# Patient Record
Sex: Male | Born: 1980 | Race: White | Hispanic: No | Marital: Single | Smoking: Current every day smoker
Health system: Southern US, Community
[De-identification: ages and names within clinical notes are randomized; demographics above are authoritative.]

## PROBLEM LIST (undated history)

## (undated) ENCOUNTER — Ambulatory Visit (HOSPITAL_COMMUNITY): Admission: EM | Payer: Self-pay

## (undated) DIAGNOSIS — K219 Gastro-esophageal reflux disease without esophagitis: Secondary | ICD-10-CM

## (undated) DIAGNOSIS — T7840XA Allergy, unspecified, initial encounter: Secondary | ICD-10-CM

## (undated) DIAGNOSIS — E119 Type 2 diabetes mellitus without complications: Secondary | ICD-10-CM

## (undated) DIAGNOSIS — I10 Essential (primary) hypertension: Secondary | ICD-10-CM

## (undated) DIAGNOSIS — F419 Anxiety disorder, unspecified: Secondary | ICD-10-CM

## (undated) DIAGNOSIS — F32A Depression, unspecified: Secondary | ICD-10-CM

## (undated) DIAGNOSIS — E785 Hyperlipidemia, unspecified: Secondary | ICD-10-CM

## (undated) DIAGNOSIS — J45909 Unspecified asthma, uncomplicated: Secondary | ICD-10-CM

## (undated) HISTORY — DX: Anxiety disorder, unspecified: F41.9

## (undated) HISTORY — DX: Essential (primary) hypertension: I10

## (undated) HISTORY — DX: Hyperlipidemia, unspecified: E78.5

## (undated) HISTORY — DX: Type 2 diabetes mellitus without complications: E11.9

## (undated) HISTORY — PX: TOOTH EXTRACTION: SUR596

## (undated) HISTORY — DX: Depression, unspecified: F32.A

## (undated) HISTORY — DX: Gastro-esophageal reflux disease without esophagitis: K21.9

## (undated) HISTORY — DX: Allergy, unspecified, initial encounter: T78.40XA

## (undated) HISTORY — DX: Unspecified asthma, uncomplicated: J45.909

---

## 2005-09-22 ENCOUNTER — Ambulatory Visit: Payer: Self-pay | Admitting: Internal Medicine

## 2005-10-06 ENCOUNTER — Ambulatory Visit: Payer: Self-pay | Admitting: Internal Medicine

## 2005-10-08 ENCOUNTER — Ambulatory Visit: Payer: Self-pay | Admitting: *Deleted

## 2005-10-20 ENCOUNTER — Ambulatory Visit: Payer: Self-pay | Admitting: Internal Medicine

## 2005-12-25 ENCOUNTER — Ambulatory Visit: Payer: Self-pay | Admitting: Internal Medicine

## 2005-12-26 ENCOUNTER — Ambulatory Visit (HOSPITAL_COMMUNITY): Admission: RE | Admit: 2005-12-26 | Discharge: 2005-12-26 | Payer: Self-pay | Admitting: Internal Medicine

## 2005-12-28 ENCOUNTER — Emergency Department (HOSPITAL_COMMUNITY): Admission: EM | Admit: 2005-12-28 | Discharge: 2005-12-28 | Payer: Self-pay | Admitting: Emergency Medicine

## 2005-12-29 ENCOUNTER — Ambulatory Visit: Payer: Self-pay | Admitting: Internal Medicine

## 2006-01-16 ENCOUNTER — Emergency Department (HOSPITAL_COMMUNITY): Admission: EM | Admit: 2006-01-16 | Discharge: 2006-01-16 | Payer: Self-pay | Admitting: Family Medicine

## 2006-01-19 ENCOUNTER — Ambulatory Visit: Payer: Self-pay | Admitting: Gastroenterology

## 2006-03-02 ENCOUNTER — Emergency Department (HOSPITAL_COMMUNITY): Admission: EM | Admit: 2006-03-02 | Discharge: 2006-03-02 | Payer: Self-pay | Admitting: Emergency Medicine

## 2006-08-28 ENCOUNTER — Ambulatory Visit: Payer: Self-pay | Admitting: Internal Medicine

## 2006-10-06 ENCOUNTER — Ambulatory Visit: Payer: Self-pay | Admitting: Internal Medicine

## 2006-11-20 ENCOUNTER — Ambulatory Visit: Payer: Self-pay | Admitting: Internal Medicine

## 2006-11-27 ENCOUNTER — Ambulatory Visit: Payer: Self-pay | Admitting: Internal Medicine

## 2006-11-28 ENCOUNTER — Inpatient Hospital Stay (HOSPITAL_COMMUNITY): Admission: EM | Admit: 2006-11-28 | Discharge: 2006-12-02 | Payer: Self-pay | Admitting: Emergency Medicine

## 2006-12-11 ENCOUNTER — Ambulatory Visit: Payer: Self-pay | Admitting: Internal Medicine

## 2007-01-22 ENCOUNTER — Emergency Department (HOSPITAL_COMMUNITY): Admission: EM | Admit: 2007-01-22 | Discharge: 2007-01-22 | Payer: Self-pay | Admitting: Family Medicine

## 2007-01-29 ENCOUNTER — Ambulatory Visit: Payer: Self-pay | Admitting: Internal Medicine

## 2007-02-04 ENCOUNTER — Ambulatory Visit: Payer: Self-pay | Admitting: Internal Medicine

## 2007-03-04 ENCOUNTER — Emergency Department (HOSPITAL_COMMUNITY): Admission: EM | Admit: 2007-03-04 | Discharge: 2007-03-04 | Payer: Self-pay | Admitting: Family Medicine

## 2007-03-18 ENCOUNTER — Ambulatory Visit: Payer: Self-pay | Admitting: Internal Medicine

## 2007-04-14 ENCOUNTER — Ambulatory Visit (HOSPITAL_BASED_OUTPATIENT_CLINIC_OR_DEPARTMENT_OTHER): Admission: RE | Admit: 2007-04-14 | Discharge: 2007-04-14 | Payer: Self-pay | Admitting: Internal Medicine

## 2007-04-25 ENCOUNTER — Ambulatory Visit: Payer: Self-pay | Admitting: Internal Medicine

## 2007-07-21 ENCOUNTER — Ambulatory Visit: Payer: Self-pay | Admitting: Internal Medicine

## 2007-08-04 ENCOUNTER — Encounter (INDEPENDENT_AMBULATORY_CARE_PROVIDER_SITE_OTHER): Payer: Self-pay | Admitting: *Deleted

## 2007-10-21 ENCOUNTER — Emergency Department (HOSPITAL_COMMUNITY): Admission: EM | Admit: 2007-10-21 | Discharge: 2007-10-21 | Payer: Self-pay | Admitting: Emergency Medicine

## 2007-11-03 ENCOUNTER — Ambulatory Visit: Payer: Self-pay | Admitting: Internal Medicine

## 2007-11-03 LAB — CONVERTED CEMR LAB
ALT: 45 units/L (ref 0–53)
AST: 18 units/L (ref 0–37)
Albumin: 4.6 g/dL (ref 3.5–5.2)
Alkaline Phosphatase: 55 units/L (ref 39–117)
BUN: 15 mg/dL (ref 6–23)
Bilirubin, Direct: 0.1 mg/dL (ref 0.0–0.3)
CO2: 23 meq/L (ref 19–32)
Calcium: 9.6 mg/dL (ref 8.4–10.5)
Chloride: 103 meq/L (ref 96–112)
Cholesterol: 238 mg/dL — ABNORMAL HIGH (ref 0–200)
Creatinine, Ser: 1.02 mg/dL (ref 0.40–1.50)
Glucose, Bld: 65 mg/dL — ABNORMAL LOW (ref 70–99)
HDL: 37 mg/dL — ABNORMAL LOW (ref >39)
Indirect Bilirubin: 0.4 mg/dL (ref 0.0–0.9)
LDL Cholesterol: 131 mg/dL — ABNORMAL HIGH (ref 0–99)
Potassium: 4.8 meq/L (ref 3.5–5.3)
Sodium: 138 meq/L (ref 135–145)
Total Bilirubin: 0.5 mg/dL (ref 0.3–1.2)
Total CHOL/HDL Ratio: 6.4
Total Protein: 7.6 g/dL (ref 6.0–8.3)
Triglycerides: 348 mg/dL — ABNORMAL HIGH (ref <150)
VLDL: 70 mg/dL — ABNORMAL HIGH (ref 0–40)
Valproic Acid Lvl: 71 ug/mL (ref 50.0–100.0)

## 2007-12-17 ENCOUNTER — Ambulatory Visit: Payer: Self-pay | Admitting: Internal Medicine

## 2008-04-25 ENCOUNTER — Ambulatory Visit: Payer: Self-pay | Admitting: Internal Medicine

## 2009-04-05 ENCOUNTER — Encounter: Admission: RE | Admit: 2009-04-05 | Discharge: 2009-05-09 | Payer: Self-pay | Admitting: Family Medicine

## 2009-11-25 ENCOUNTER — Ambulatory Visit: Payer: Self-pay | Admitting: Diagnostic Radiology

## 2009-11-25 ENCOUNTER — Emergency Department (HOSPITAL_BASED_OUTPATIENT_CLINIC_OR_DEPARTMENT_OTHER): Admission: EM | Admit: 2009-11-25 | Discharge: 2009-11-25 | Payer: Self-pay | Admitting: Emergency Medicine

## 2009-12-31 ENCOUNTER — Encounter: Admission: RE | Admit: 2009-12-31 | Discharge: 2010-01-31 | Payer: Self-pay | Admitting: Family Medicine

## 2010-04-09 ENCOUNTER — Emergency Department (HOSPITAL_BASED_OUTPATIENT_CLINIC_OR_DEPARTMENT_OTHER): Admission: EM | Admit: 2010-04-09 | Discharge: 2010-04-09 | Payer: Self-pay | Admitting: Emergency Medicine

## 2010-09-30 ENCOUNTER — Emergency Department (HOSPITAL_BASED_OUTPATIENT_CLINIC_OR_DEPARTMENT_OTHER): Admission: EM | Admit: 2010-09-30 | Discharge: 2010-09-30 | Payer: Self-pay | Admitting: Emergency Medicine

## 2010-11-20 ENCOUNTER — Emergency Department (HOSPITAL_BASED_OUTPATIENT_CLINIC_OR_DEPARTMENT_OTHER)
Admission: EM | Admit: 2010-11-20 | Discharge: 2010-11-20 | Payer: Self-pay | Source: Home / Self Care | Admitting: Emergency Medicine

## 2011-04-01 NOTE — Procedures (Signed)
Edwin, Ford NO.:  [id number]   MEDICAL RECORD NO.:  [id number]          PATIENT TYPE:  OUT   LOCATION:  SLEEP CENTER                 FACILITY:  [facility]   PHYSICIAN:  Clinton D. Maple Hudson, MD, FCCP, FACPDATE OF BIRTH:  01/07/1979   DATE OF STUDY:  04/14/2007                            NOCTURNAL POLYSOMNOGRAM   REFERRING PHYSICIAN:   INDICATIONS FOR PROCEDURE:  Hypersomnia with sleep apnea.   RESULTS:  [name] sleepiness score 8/24, BMI 42.3, weight 313 pounds.   MEDICATIONS:  Home medication is listed and reviewed.   SLEEP ARCHITECTURE:  Total sleep time 323 minutes with sleep efficiency  80%. Stage 1 was 11%; Stage 2, 79%; Stages 3 and 4, absent. REM 10% of  total sleep time. Sleep latency 11 minutes. REM latency 177 minutes.  Awake after sleep onset 72 minutes. Arousal index 16.5. No bedtime  medication was taken.   RESPIRATORY DATA:  Split study protocol. Apnea hypopnea index (AHI RDI)  13.2 obstructive events per hour, indicating mild obstructive sleep  apnea/hypopnea syndrome before CPAP. There were 4 obstructive apnea's  and 35 hypopnea's before CPAP. Events were positional, mostly associated  with supine sleep position, which was the dominant sleep position in the  first half of the night. REM AHI 50.6. CPAP was titrated to 14 CWP, AHI  zero per hour. A medium ResMed Quattro full face mask was used with a  heated humidifier.   OXYGEN DATA:  Moderate to loud snoring with oxygen desaturation to a  nadir of 85%. After CPAP control, saturation held 93% to 95% on room  air.   CARDIAC DATA:  Normal sinus rhythm.   MOVEMENT/PARASOMNIA:  Occasional limb jerk with arousal, insignificant.   IMPRESSION/RECOMMENDATIONS:  1. Mild obstructive sleep apnea/hypopnea syndrome, AHI 13.2 per hour      with most events and sleep in the first part of the night      associated with supine sleep position. Moderate to loud snoring      with oxygen desaturation to a  nadir of 85%.  2. Successful CPAP titration to 14 CWP, AHI zero per hour. A medium      ResMed Quattro full face mask was used with a heated humidifier.      Clinton D. Maple Hudson, MD, [facility], FACP  Diplomate, Biomedical engineer of Sleep Medicine  Electronically Signed     CDY/MEDQ  D:  04/24/2007 12:01:32  T:  04/24/2007 20:07:52  Job:  [id number]

## 2011-04-04 NOTE — H&P (Signed)
Edwin Ford, Edwin Ford NO.:  [id number]   MEDICAL RECORD NO.:  [id number]          PATIENT TYPE:  EMS   LOCATION:  ED                           FACILITY:  [facility]   PHYSICIAN:  Marcellus Scott, MD     DATE OF BIRTH:  [birth year]   DATE OF ADMISSION:  11/28/2006  DATE OF DISCHARGE:                              HISTORY & PHYSICAL   PRIMARY CARE PHYSICIAN:  Dineen Kid. Reche Dixon, M.D. of [facility]; hence,  patient unassigned to the [facility].   HEMATOLOGIST:  Lajuana Matte, MD   PSYCHIATRIST:  [facility].   CHIEF COMPLAINT:  Generalized weakness, polyuria, polydipsia.   HISTORY OF PRESENT ILLNESS:  Edwin Ford is a pleasant 30 year old  Caucasian male patient with a past medical history of type 2 diabetes,  hypertension, asthma, and bipolar disorder.  He was first diagnosed to  have diabetes approximately a year ago when he was initially started on  Glucotrol; however, when he presented in February, 2007 for an episode  of chest pain, the Glucotrol was discontinued for some unclear reasons.  Since then, the patient has not been on any medications for diabetes.  Patient looks like also did not follow up with a primary medical doctor  until approximately two weeks ago.  Since the beginning of this year,  patient has noted generalized weakness, polyuria, polydipsia, decreased  appetite, nausea, blurred vision, following which he sought attention  with Dr. Reche Dixon when he was started on Lantus 10 units subcutaneously  nightly and was advised to titrate the dose upwards.  He was also  started on Metformin.  The patient says that his home blood sugar checks  have persistently shown, high readings following which he presented  back to his physician on Friday when blood works were done, and his  Metformin was stopped, and Glucotrol again started.  Today, he received  a call from his nurse at the [facility] health center, with abnormal  lab indices.  Patient continued to remain sick and was advised to come  to the emergency room for evaluation and management.   PAST MEDICAL HISTORY:  1. Diabetes type 2 of one year duration.  2. Hypertension of three year's duration.  3. Asthma since childhood with infrequent exacerbations.  Uses his      inhaler twice a week p.r.n.  4. Bipolar disorder.   DRUG ALLERGIES:  ALEVE.  Says a questionable history of  thrombocytopenia.   MEDICATIONS:  1. Metoprolol 25 mg p.o. b.i.d.  2. Depakote 500 mg tablet 3 tablets at bedtime.  3. Fish oil 1000 mg 2 tablets a day.  4. Clonazepam 1 mg 3 times a day.  5. Lantus insulin 10 units at bedtime, which has been titrated up to      25 units at bedtime.  6. Lisinopril 20 mg p.o. daily.  7. Cozaar 50 mg p.o. daily.  8. Hydrochlorothiazide 25 mg p.o. daily.  9. Lamictal 100 mg p.o. at bedtime.  10.Protonix 40 mg p.o. daily.  11.Glucotrol 10 mg p.o. daily.  12.Clotrimazole/betamethasone diproprionate cream, use  2 times daily      to the rash.   FAMILY HISTORY:  Mother with history of hypertension, diabetes,  dyslipidemia, and coronary artery bypass graft.  Father with history of  nephrectomy for renal cell CA, hypertension, dyslipidemia, diabetes,  coronary stents.   SOCIAL HISTORY:  Patient is unemployed.  Smokes one pack of cigarettes  per day since the age of 14 years.  Quit social alcohol intake 10 years  ago.  History of marijuana use currently.   REVIEW OF SYSTEMS:  HEENT:  Patient denies any headache, earache, sore  throat, difficulty swallowing.  Has blurred vision.  GENERAL:  Patient  with history of chills but no fevers or rigors.  Generalized weakness.  RESPIRATORY:  Occasional cough with white sputum.  Dyspnea on exertion.  There is no wheezing, tightness of the chest.  CARDIOVASCULAR:  No chest  pain, palpitations, orthopnea, PND, leg swelling.  ABDOMEN:  History of  intermittent abdominal pain over the last two weeks.   Nausea with no  vomiting.  Decreased appetite.  History of diarrhea maybe once or twice  a day.  No constipation.  GENITOURINARY:  History of polyuria.  No  dysuria, urgency.  MUSCULOSKELETAL:  No pain.  EXTREMITIES:  Unremarkable.  PSYCHIATRIC:  No delusions, hallucinations, suicidal  ideations or homicidal ideation.  CNS:  No asymmetrical limb weakness,  tingling, numbness, mouth twisting, slurred speech, or loss of  consciousness.   PHYSICAL EXAMINATION:  GENERAL:  Edwin Ford is a pleasant young male,  obese, in no cardiopulmonary or painful distress.  Quite comfortable.  VITAL SIGNS:  Temperature 98.1 degrees Fahrenheit, pulse 97 per minute,  regular, respirations 16 per minute, blood pressure 133/61.  HEENT:  Head is normocephalic and atraumatic.  Pupils are round and  symmetrical, equally reactive to light and accommodation.  Extraocular  muscle movements are intact.  Mucosa are dry and anicteric.  Oral cavity  has multiple teeth fillings.  No pharyngeal erythema.  NECK:  His neck is thick.  There is no JVD, carotid bruit,  lymphadenopathy, or goiter.  RESPIRATORY:  Clear to auscultation bilaterally.  CARDIOVASCULAR:  First and second heart sounds heard.  No third or  fourth heart sounds.  No murmurs, rubs, gallops, or clicks.  ABDOMEN:  Obese.  Nontender.  No organomegaly or mass.  Bowel sounds are  heard normally.  GENITOURINARY:  no rash  EXTREMITIES:  Peripheral pulses symmetrically felt.  No clubbing,  cyanosis or edema.  CENTRAL NERVOUS SYSTEM:  Patient is awake, alert and oriented x3.  No  cranial nerve deficits.  SKIN:  Without any rashes.   LAB DATA:  CBC:  Hemoglobin 14.6, hematocrit 40.8, MCV 89, white blood  cells 3.4, platelet count 173.  Complete metabolic panel with sodium of  121.  Corrected sodium is 127.  Potassium is 4.1, chloride 84, bicarb  25, glucose 510, BUN 22, creatinine 0.93, total bilirubin 1.4, alk phos 77, AST 38, ALT 55, total protein 5.3,  albumin 3.6, calcium 9.  The most  recent CBGs after Glucomander is 329.   ASSESSMENT/PLAN:  Edwin Ford is a 30 year old male patient with a  history of type 2 diabetes, hypertension, asthma, and bipolar disorder  now with:  1. Uncontrolled diabetes:  Will admit the patient to the      medical/surgical floor.  Will place the patient n.p.o.  Will      aggressively hydrate the patient with normal saline with potassium      supplements.  Will  place the patient on Glucomander insulin and      then subsequently on Lantus insulin.  I will monitor the patient's      basic metabolic panel frequently.  Will obtain diabetes      instructions and diet counseling for the patient.  Will check the      patient's hemoglobin A1C, urinalysis, and fasting lipids.  Will      hold the patient's hydrochlorothiazide at this time secondary to      his dehydration.  2. Dehydration secondary to uncontrolled diabetes:  Management per #1.  3. Hypertension:  Controlled at this time.  Will continue the      patient's home medications of metoprolol, lisinopril, and Cozaar.  4. History of asthma:  Stable at this time.  Will place the patient on      p.r.n. nebulization bronchodilators.  5. Bipolar disorder, stable at this time:  Will continue the patient's      home medications of Depakote, Clonazepam, and Lamictal.  6. Will place the patient on deep venous thrombosis and      gastrointestinal prophylaxis.  7. Abnormal hepatic panel, questionable secondary to fatty liver.      Will follow up hepatic panel and further workup if no improvement.  8. Obesity:  For dietary counseling and weight loss.  9. Rule out obstructive sleep apnea as an outpatient.      Marcellus Scott, MD  Electronically Signed     AH/MEDQ  D:  11/28/2006  T:  11/28/2006  Job:  [id number]   cc:   Dineen Kid. Reche Dixon, M.D.  Fax: [phone]

## 2011-04-04 NOTE — Discharge Summary (Signed)
Edwin Ford, Edwin Ford NO.:  [id number]   MEDICAL RECORD NO.:  [id number]          PATIENT TYPE:  INP   LOCATION:  1607                         FACILITY:  [facility]   PHYSICIAN:  Michaelyn Barter, M.D. DATE OF BIRTH:  1981/09/21   DATE OF ADMISSION:  11/28/2006  DATE OF DISCHARGE:  12/02/2006                               DISCHARGE SUMMARY   PRIMARY CARE PHYSICIAN:  Dr. Reche Dixon of [facility].   FINAL DIAGNOSES:  1. Uncontrolled diabetes mellitus.  2. Renal insufficiency.  3. Elevated liver enzymes.  4. Hyperlipidemia.  5. Fatty liver by ultrasound examination.  6. Dehydration.   PROCEDURES:  Ultrasound of the abdomen completed on  January13,2008.   HISTORY OF PRESENT ILLNESS:  Mr. Edwin Ford is a 30 year old gentleman with  a past medical history of type 2 diabetes mellitus, hypertension,  asthma, and bipolar disorder.  The patient had not been taking any  medications for his diabetes prior to this admission.  He complained of  generalized weakness, polyuria, polydipsia, decreased appetite, nausea,  blurred vision, and was recently started on Lantus insulin by his  primary care doctor as well as metformin.  He indicated that his sugars  at home had been elevated.  He went to see his PCP Friday prior to this  admission and was subsequently called by one of the health care  professionals of the facility and told to come to the hospital for  further evaluation.   PAST MEDICAL HISTORY:   HOSPITAL COURSE.:  1. Uncontrolled diabetes mellitus.  The patient arrived to the ER and      was found to have elevated glucose's. He was started on the      Glucommander insulin treatment.  During the course of his      hospitalization, his sugars became better controlled and the      Glucommander was discontinued.  The patient began to receive      subcutaneous Lantus insulin as well as glipizide.  His polyuria,      polydipsia and other constitutional symptoms have  improved.  2. Dehydration.  This was secondary to the polyuria that occurred as a      result of the patient's uncontrolled diabetes mellitus.  This has      improved with aggressive IV fluid hydration.  3. Hyponatremia. At the time of his admission, the patient's sodium      level was noted to be low.  This may have been associated with the      hyperosmolar hyperglycemic state.  As the patient's sugars have      become better controlled, his sodium level has corrected.  4. Elevated liver enzymes.  The patient's liver enzymes have been      noted to be slightly elevated.  An ultrasound of the patient's      abdomen was completed on January 13.  It revealed diffuse, fatty      infiltration of the liver.  Small gallbladder noted.  No evidence      of gallstones or biliary dilatation.  The patient has not  complained of any abdominal related symptoms.  The etiology of the      patient's diffusely infiltrative fatty liver may need to be worked      up further once the patient is discharged.  5. Hyperlipidemia.  The patient did have a fasting lipid profile      completed during the course of this hospitalization. His      cholesterol level was noted to have been 543.  His triglycerides      are noted to be 3307.  The patient definitely needs to be started      on a lipid-lowering agent given his age and his multiple risk      factors. However, because the patient's liver enzymes are currently      still slightly elevated, the patient has not been started on a      statin or Tricor agent at this time.  I have told the patient that      it is essential that he follow up with his primary care physician      to be reevaluated regarding the need for a lipid-lowering agent.      The patient is at high risk for a MI and/or stroke given his      hyperlipidemia and his numerous other comorbidities.  6. Renal insufficiency.  On January 13, the patient's BUN was noted to      be 20.  His creatinine  was 1.85.  These numbers were elevated, most      likely secondary to dehydration. With IV fluid hydration, the      patient's BUN and creatinine have improved.  On December 01, 2006,      the patient's BUN was 11.  His creatinine 0.69.  Currently, the      patient indicates he feels better.  He denies nausea, vomiting, no      polyuria, no polydipsia.  His vitals today:  His temperature is      97.8, heart rate 67, respirations 20, blood pressure 133/85. O2 sat      95% on room air.  CBG 175 and 168.  The patient will be discharged      from the hospital.  His medications at the time of discharge will      consist of:  7. Klonopin 1 mg p.o. t.i.d.  8. Depakote 1500 mg p.o. nightly.  9. Glipizide 10 mg p.o. daily.  10.Lantus insulin 30 units subcutaneous nightly.  11.Lamictal 100 mg p.o. daily.  12.Lisinopril 20 mg p.o. daily.  13.Cozaar 50 mg p.o. daily.  14.Metoprolol 25 mg p.o. b.i.d.  15.Protonix 40 mg p.o. daily.   The patient will be instructed to follow up with [facility] within the  next 1 week; and, again, the patient will be instructed to have his  liver enzymes rechecked and to discuss whether or not it is okay to  start a lipid-lowering agent with his primary care doctor.      Michaelyn Barter, M.D.  Electronically Signed     OR/MEDQ  D:  12/02/2006  T:  12/02/2006  Job:  [id number]   cc:   Dineen Kid. Reche Dixon, M.D.  Fax: [phone]

## 2011-07-17 ENCOUNTER — Emergency Department (HOSPITAL_BASED_OUTPATIENT_CLINIC_OR_DEPARTMENT_OTHER)
Admission: EM | Admit: 2011-07-17 | Discharge: 2011-07-18 | Disposition: A | Discharge status: Home / Self Care | Payer: Worker's Compensation | Attending: Emergency Medicine | Admitting: Emergency Medicine

## 2011-07-17 ENCOUNTER — Encounter: Payer: Self-pay | Admitting: *Deleted

## 2011-07-17 DIAGNOSIS — E7800 Pure hypercholesterolemia, unspecified: Secondary | ICD-10-CM | POA: Insufficient documentation

## 2011-07-17 DIAGNOSIS — E119 Type 2 diabetes mellitus without complications: Secondary | ICD-10-CM | POA: Insufficient documentation

## 2011-07-17 DIAGNOSIS — M62838 Other muscle spasm: Secondary | ICD-10-CM

## 2011-07-17 DIAGNOSIS — I10 Essential (primary) hypertension: Secondary | ICD-10-CM | POA: Insufficient documentation

## 2011-07-17 DIAGNOSIS — M5380 Other specified dorsopathies, site unspecified: Secondary | ICD-10-CM | POA: Insufficient documentation

## 2011-07-17 DIAGNOSIS — M549 Dorsalgia, unspecified: Secondary | ICD-10-CM | POA: Insufficient documentation

## 2011-07-17 NOTE — ED Notes (Signed)
Pt was riding a [employer], mining at work and hit a hole states that he jarred his back pt with mid right side back pain

## 2011-07-18 MED ORDER — DIAZEPAM 5 MG PO TABS
5.0000 mg | ORAL_TABLET | Freq: Once | ORAL | Status: AC
  Administered 2011-07-18: 5 mg via ORAL
  Filled 2011-07-18: qty 1

## 2011-07-18 MED ORDER — OXYCODONE-ACETAMINOPHEN 5-325 MG PO TABS
2.0000 | ORAL_TABLET | Freq: Once | ORAL | Status: AC
  Administered 2011-07-18: 2 via ORAL
  Filled 2011-07-18 (×3): qty 1

## 2011-07-18 MED ORDER — KETOROLAC TROMETHAMINE 30 MG/ML IJ SOLN
60.0000 mg | Freq: Once | INTRAMUSCULAR | Status: AC
  Administered 2011-07-18: 60 mg via INTRAVENOUS
  Filled 2011-07-18 (×2): qty 1

## 2011-07-18 MED ORDER — OXYCODONE-ACETAMINOPHEN 5-325 MG PO TABS: 2.0000 | ORAL_TABLET | ORAL | Status: AC | PRN

## 2011-07-18 MED ORDER — NAPROXEN 500 MG PO TABS: 500.0000 mg | ORAL_TABLET | Freq: Two times a day (BID) | ORAL | Status: DC

## 2011-07-18 MED ORDER — HYDROMORPHONE HCL 2 MG/ML IJ SOLN
2.0000 mg | Freq: Once | INTRAMUSCULAR | Status: AC
  Administered 2011-07-18: 2 mg via INTRAMUSCULAR
  Filled 2011-07-18: qty 1

## 2011-07-18 NOTE — ED Provider Notes (Signed)
History     CSN: [id number] Arrival date & time: 07/17/2011 10:56 PM  Chief Complaint  Patient presents with  . Back Pain   HPI 30 year old male presents to emergency department with complaint of mid to low back pain starting today. 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 Patient reports discharge him knocking his hat and glasses off. Patient denies any pain at that time, however after getting home he began to have worsening pain on the right side of his lower back started around 5 PM. Patient reports taking 6 over-the-counter ibuprofen without improvement in pain. Pain is now severe, he is unable to sit comfortably secondary to pain. Patient denies any bowel or bladder incontinence. No weakness of legs, no numbness or tingling. Patient with history of prior back spasms and seen the emergency department for same.  Past Medical History  Diagnosis Date  . Diabetes mellitus   . Hypertension   . Hypercholesteremia     History reviewed. No pertinent past surgical history.  History reviewed. No pertinent family history.  History  Substance Use Topics  . Smoking status: Current Everyday Smoker -- 1.0 packs/day  . Smokeless tobacco: Not on file  . Alcohol Use: No      Review of Systems  All other systems reviewed and are negative.    Physical Exam  BP 151/100  Pulse 100  Temp(Src) 98.3 F (36.8 C) (Oral)  Resp 16  SpO2 99%  Physical Exam  Constitutional: He is oriented to person, place, and time. He appears well-developed and well-nourished. He appears distressed.       Overweight, uncomfortable appearing  HENT:  Head: Normocephalic and atraumatic.  Eyes: Conjunctivae and EOM are normal. Pupils are equal, round, and reactive to light. Right eye exhibits no discharge. Left eye exhibits no discharge. No scleral icterus.  Neck: Normal range of motion. Neck supple. No JVD present. No tracheal deviation present. No  thyromegaly present.  Cardiovascular: Normal rate, regular rhythm, normal heart sounds and intact distal pulses.  Exam reveals no gallop and no friction rub.   No murmur heard. Pulmonary/Chest: Effort normal and breath sounds normal. No stridor. No respiratory distress. He has no wheezes. He has no rales. He exhibits no tenderness.  Musculoskeletal: He exhibits tenderness. He exhibits no edema.       Patient with tenderness over right paraspinal muscle in lumbar region. No midline tenderness or left-sided back tenderness. Patient with normal gait no foot drop  Lymphadenopathy:    He has no cervical adenopathy.  Neurological: He is alert and oriented to person, place, and time. He displays normal reflexes. He exhibits normal muscle tone.  Skin: Skin is warm and dry. No rash noted. He is not diaphoretic. No erythema. No pallor.  Psychiatric: He has a normal mood and affect.    ED Course  Procedures  3:17 AM Pt feeling slightly better.  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 No reflex for back pain noted on history or physical exam. Suspect muscle strain/spasm. Patient given precautions for reasons to return. He will need to followup with his works occupational health or workers comp physician regarding injury while at work      Olivia Mackie, MD 07/18/11 [id number]

## 2012-04-28 ENCOUNTER — Emergency Department (HOSPITAL_COMMUNITY): Payer: Self-pay

## 2012-04-28 ENCOUNTER — Encounter (HOSPITAL_COMMUNITY): Payer: Self-pay | Admitting: *Deleted

## 2012-04-28 ENCOUNTER — Emergency Department (HOSPITAL_COMMUNITY)
Admission: EM | Admit: 2012-04-28 | Discharge: 2012-04-28 | Disposition: A | Discharge status: Home / Self Care | Payer: Self-pay | Attending: Emergency Medicine | Admitting: Emergency Medicine

## 2012-04-28 DIAGNOSIS — R079 Chest pain, unspecified: Secondary | ICD-10-CM | POA: Insufficient documentation

## 2012-04-28 DIAGNOSIS — E7800 Pure hypercholesterolemia, unspecified: Secondary | ICD-10-CM | POA: Insufficient documentation

## 2012-04-28 DIAGNOSIS — E119 Type 2 diabetes mellitus without complications: Secondary | ICD-10-CM | POA: Insufficient documentation

## 2012-04-28 DIAGNOSIS — I10 Essential (primary) hypertension: Secondary | ICD-10-CM | POA: Insufficient documentation

## 2012-04-28 DIAGNOSIS — Z79899 Other long term (current) drug therapy: Secondary | ICD-10-CM | POA: Insufficient documentation

## 2012-04-28 DIAGNOSIS — F17200 Nicotine dependence, unspecified, uncomplicated: Secondary | ICD-10-CM | POA: Insufficient documentation

## 2012-04-28 DIAGNOSIS — R42 Dizziness and giddiness: Secondary | ICD-10-CM | POA: Insufficient documentation

## 2012-04-28 LAB — CBC
HCT: 43.9 % (ref 39.0–52.0)
MCH: 30.1 pg (ref 26.0–34.0)
MCHC: 35.3 g/dL (ref 30.0–36.0)
MCV: 85.2 fL (ref 78.0–100.0)
RBC: 5.15 MIL/uL (ref 4.22–5.81)

## 2012-04-28 LAB — DIFFERENTIAL
Eosinophils Relative: 3 % (ref 0–5)
Lymphs Abs: 2.2 10*3/uL (ref 0.7–4.0)
Monocytes Absolute: 0.6 10*3/uL (ref 0.1–1.0)

## 2012-04-28 LAB — COMPREHENSIVE METABOLIC PANEL
ALT: 124 U/L — ABNORMAL HIGH (ref 0–53)
AST: 68 U/L — ABNORMAL HIGH (ref 0–37)
Albumin: 3.7 g/dL (ref 3.5–5.2)
BUN: 9 mg/dL (ref 6–23)
CO2: 25 mEq/L (ref 19–32)
Creatinine, Ser: 0.63 mg/dL (ref 0.50–1.35)
GFR calc non Af Amer: >90 mL/min (ref >90)
Total Bilirubin: 0.4 mg/dL (ref 0.3–1.2)
Total Protein: 6.8 g/dL (ref 6.0–8.3)

## 2012-04-28 LAB — TROPONIN I: Troponin I: <0.3 ng/mL (ref <0.30)

## 2012-04-28 MED ORDER — SODIUM CHLORIDE 0.9 % IV BOLUS (SEPSIS)
1000.0000 mL | Freq: Once | INTRAVENOUS | Status: AC
  Administered 2012-04-28: 1000 mL via INTRAVENOUS

## 2012-04-28 NOTE — Discharge Instructions (Signed)
Rest at home today and follow up with your md next week for recheck

## 2012-04-28 NOTE — ED Notes (Signed)
Pt states he has been under a LOT of stress.  He states he has felt "lightheaded" for 3 days.  This morning, he began experiencing L shoulder pain.  He states "I just feel bad".

## 2012-04-28 NOTE — ED Notes (Signed)
CBG is 123 

## 2012-04-28 NOTE — ED Provider Notes (Cosign Needed Addendum)
History     CSN: [id number]  Arrival date & time 04/28/12  8119   First MD Initiated Contact with Patient 04/28/12 [phone]      Chief Complaint  Patient presents with  . Dizziness  . Chest Pain    (Consider location/radiation/quality/duration/timing/severity/associated sxs/prior treatment) Patient is a 31 y.o. male presenting with weakness. The history is provided by the patient (pt had chest pain lasting a few minutes today.  pt has had much stress lately). No language interpreter was used.  Weakness Primary symptoms do not include headaches or seizures. The symptoms began less than 1 hour ago. The symptoms are resolved. The neurological symptoms are diffuse. Context: nothing.  Additional symptoms include weakness. Additional symptoms do not include hallucinations. Medical issues do not include seizures.    Past Medical History  Diagnosis Date  . Diabetes mellitus   . Hypertension   . Hypercholesteremia     History reviewed. No pertinent past surgical history.  No family history on file.  History  Substance Use Topics  . Smoking status: Current Everyday Smoker -- 1.0 packs/day  . Smokeless tobacco: Not on file  . Alcohol Use: No      Review of Systems  Constitutional: Negative for fatigue.  HENT: Negative for congestion, sinus pressure and ear discharge.   Eyes: Negative for discharge.  Respiratory: Negative for cough.   Cardiovascular: Positive for chest pain.  Gastrointestinal: Negative for abdominal pain and diarrhea.  Genitourinary: Negative for frequency and hematuria.  Musculoskeletal: Negative for back pain.  Skin: Negative for rash.  Neurological: Positive for weakness. Negative for seizures and headaches.  Hematological: Negative.   Psychiatric/Behavioral: Negative for hallucinations.    Allergies  Review of patient's allergies indicates no known allergies.  Home Medications   Current Outpatient Rx  Name Route Sig Dispense Refill  . ALBUTEROL 90  MCG/ACT IN AERS Inhalation Inhale 2 puffs into the lungs once as needed. For shortness of breath     . ALPRAZOLAM 2 MG PO TABS Oral Take 2 mg by mouth 4 (four) times daily as needed. For anxiety    . GLIPIZIDE 10 MG PO TABS Oral Take 10 mg by mouth 2 (two) times daily.     Marland Kitchen LISINOPRIL 20 MG PO TABS Oral Take 20 mg by mouth daily.      Marland Kitchen LOSARTAN POTASSIUM 50 MG PO TABS Oral Take 50 mg by mouth daily.    Marland Kitchen METFORMIN HCL 500 MG PO TABS Oral Take 500 mg by mouth 2 (two) times daily with a meal.    . METOPROLOL TARTRATE 50 MG PO TABS Oral Take 50 mg by mouth 2 (two) times daily.      Marland Kitchen OMEPRAZOLE 20 MG PO CPDR Oral Take 20 mg by mouth 2 (two) times daily.     . SERTRALINE HCL 50 MG PO TABS Oral Take 50 mg by mouth daily.      Marland Kitchen SIMVASTATIN 20 MG PO TABS Oral Take 20 mg by mouth at bedtime.        BP 127/58  Pulse 79  Temp 98.2 F (36.8 C) (Oral)  Resp 18  Ht 6' (1.829 m)  Wt 314 lb (142.429 kg)  BMI 42.59 kg/m2  SpO2 97%  Physical Exam  Constitutional: He is oriented to person, place, and time. He appears well-developed.  HENT:  Head: Normocephalic and atraumatic.  Eyes: Conjunctivae and EOM are normal. No scleral icterus.  Neck: Neck supple. No thyromegaly present.  Cardiovascular: Normal rate and regular  rhythm.  Exam reveals no gallop and no friction rub.   No murmur heard. Pulmonary/Chest: No stridor. He has no wheezes. He has no rales. He exhibits no tenderness.  Abdominal: He exhibits no distension. There is no tenderness. There is no rebound.  Musculoskeletal: Normal range of motion. He exhibits no edema.  Lymphadenopathy:    He has no cervical adenopathy.  Neurological: He is oriented to person, place, and time. Coordination normal.  Skin: No rash noted. No erythema.  Psychiatric: He has a normal mood and affect. His behavior is normal.    ED Course  Procedures (including critical care time)  Labs Reviewed  GLUCOSE, CAPILLARY - Abnormal; Notable for the following:     Glucose-Capillary 123 (*)     All other components within normal limits  CBC  DIFFERENTIAL  COMPREHENSIVE METABOLIC PANEL  TROPONIN I   No results found.   No diagnosis found.   Date: 04/28/2012  Rate: 72  Rhythm: normal sinus rhythm  QRS Axis: normal  Intervals: normal  ST/T Wave abnormalities: normal  Conduction Disutrbances:none  Narrative Interpretation:   Old EKG Reviewed: none available    MDM  Pt improved,  Normal tests.  Will follow up with his md      Benny Lennert, MD 04/28/12 [id number]  Benny Lennert, MD 04/28/12 [phone]

## 2014-05-28 ENCOUNTER — Encounter (HOSPITAL_COMMUNITY): Payer: Self-pay | Admitting: Emergency Medicine

## 2014-05-28 ENCOUNTER — Emergency Department (INDEPENDENT_AMBULATORY_CARE_PROVIDER_SITE_OTHER)
Admission: EM | Admit: 2014-05-28 | Discharge: 2014-05-28 | Disposition: A | Discharge status: Home / Self Care | Payer: Self-pay | Source: Home / Self Care | Attending: Family Medicine | Admitting: Family Medicine

## 2014-05-28 DIAGNOSIS — M79643 Pain in unspecified hand: Secondary | ICD-10-CM

## 2014-05-28 DIAGNOSIS — M7989 Other specified soft tissue disorders: Secondary | ICD-10-CM

## 2014-05-28 DIAGNOSIS — M79609 Pain in unspecified limb: Secondary | ICD-10-CM

## 2014-05-28 LAB — CBC WITH DIFFERENTIAL/PLATELET
BASOS PCT: 0 % (ref 0–1)
Basophils Absolute: 0 10*3/uL (ref 0.0–0.1)
EOS ABS: 0.2 10*3/uL (ref 0.0–0.7)
EOS PCT: 3 % (ref 0–5)
HCT: 41.4 % (ref 39.0–52.0)
HEMOGLOBIN: 14.2 g/dL (ref 13.0–17.0)
Lymphocytes Relative: 30 % (ref 12–46)
Lymphs Abs: 1.8 10*3/uL (ref 0.7–4.0)
MCH: 30.1 pg (ref 26.0–34.0)
MCHC: 34.3 g/dL (ref 30.0–36.0)
MCV: 87.7 fL (ref 78.0–100.0)
MONOS PCT: 9 % (ref 3–12)
Monocytes Absolute: 0.5 10*3/uL (ref 0.1–1.0)
NEUTROS PCT: 58 % (ref 43–77)
Neutro Abs: 3.4 10*3/uL (ref 1.7–7.7)
PLATELETS: 164 10*3/uL (ref 150–400)
RBC: 4.72 MIL/uL (ref 4.22–5.81)
RDW: 12.5 % (ref 11.5–15.5)
WBC: 5.9 10*3/uL (ref 4.0–10.5)

## 2014-05-28 LAB — BASIC METABOLIC PANEL
ANION GAP: 15 (ref 5–15)
BUN: 11 mg/dL (ref 6–23)
CALCIUM: 9.2 mg/dL (ref 8.4–10.5)
CHLORIDE: 101 meq/L (ref 96–112)
CO2: 24 meq/L (ref 19–32)
CREATININE: 0.76 mg/dL (ref 0.50–1.35)
GFR calc Af Amer: >90 mL/min (ref >90)
GFR calc non Af Amer: >90 mL/min (ref >90)
Glucose, Bld: 252 mg/dL — ABNORMAL HIGH (ref 70–99)
Potassium: 4 mEq/L (ref 3.7–5.3)
Sodium: 140 mEq/L (ref 137–147)

## 2014-05-28 LAB — D-DIMER, QUANTITATIVE: D-Dimer, Quant: <0.27 ug/mL-FEU (ref 0.00–0.48)

## 2014-05-28 LAB — SEDIMENTATION RATE: Sed Rate: 1 mm/hr (ref 0–16)

## 2014-05-28 LAB — URIC ACID: URIC ACID, SERUM: 4.2 mg/dL (ref 4.0–7.8)

## 2014-05-28 MED ORDER — KETOROLAC TROMETHAMINE 60 MG/2ML IM SOLN
INTRAMUSCULAR | Status: AC
  Filled 2014-05-28: qty 2

## 2014-05-28 MED ORDER — METHYLPREDNISOLONE ACETATE 80 MG/ML IJ SUSP
INTRAMUSCULAR | Status: AC
  Filled 2014-05-28: qty 1

## 2014-05-28 MED ORDER — IBUPROFEN 800 MG PO TABS: 800.0000 mg | ORAL_TABLET | Freq: Three times a day (TID) | ORAL | Status: DC

## 2014-05-28 MED ORDER — KETOROLAC TROMETHAMINE 60 MG/2ML IM SOLN
60.0000 mg | Freq: Once | INTRAMUSCULAR | Status: AC
  Administered 2014-05-28: 60 mg via INTRAMUSCULAR

## 2014-05-28 MED ORDER — TRAMADOL HCL 50 MG PO TABS: 50.0000 mg | ORAL_TABLET | Freq: Four times a day (QID) | ORAL | Status: DC | PRN

## 2014-05-28 MED ORDER — METHYLPREDNISOLONE ACETATE 40 MG/ML IJ SUSP
80.0000 mg | Freq: Once | INTRAMUSCULAR | Status: AC
  Administered 2014-05-28: 80 mg via INTRAMUSCULAR

## 2014-05-28 NOTE — ED Provider Notes (Signed)
CSN: [id number]     Arrival date & time 05/28/14  1317 History   None    Chief Complaint  Patient presents with  . Edema   (Consider location/radiation/quality/duration/timing/severity/associated sxs/prior Treatment) HPI Comments: 33 year old male presents complaining of bilateral hand pain and swelling. This initially started about 2 weeks ago with some intermittent pain and swelling of his fingers that resolved with ibuprofen. However, 3 days ago this started getting worse and is no longer resolving with tramadol or ibuprofen. He has pain and swelling in both of his hands and fingers, worse on the left than the right he denies any injury and never experienced this before 2 weeks ago. 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 No personal or family history of rheumatologic disease or gout. no injury. She denies any systemic symptoms at this time   Past Medical History  Diagnosis Date  . Diabetes mellitus   . Hypertension   . Hypercholesteremia    History reviewed. No pertinent past surgical history. No family history on file. History  Substance Use Topics  . Smoking status: Current Every Day Smoker -- 1.00 packs/day  . Smokeless tobacco: Not on file  . Alcohol Use: No    Review of Systems  Constitutional: Negative for fever, chills and fatigue.  HENT: Negative for sore throat.   Eyes: Negative for visual disturbance.  Respiratory: Negative for cough and shortness of breath.   Cardiovascular: Negative for chest pain, palpitations and leg swelling.  Gastrointestinal: Negative for nausea, vomiting, abdominal pain, diarrhea and constipation.  Genitourinary: Negative for dysuria, urgency, frequency and hematuria.  Musculoskeletal: Positive for joint swelling. Negative for arthralgias, myalgias, neck pain and neck  stiffness.       Bilateral hand and finger swelling  Skin: Negative for rash.  Neurological: Negative for dizziness, weakness and light-headedness.  All other systems reviewed and are negative.   Allergies  Review of patient's allergies indicates no known allergies.  Home Medications   Prior to Admission medications   Medication Sig Start Date End Date Taking? Authorizing Provider  albuterol (PROVENTIL,VENTOLIN) 90 MCG/ACT inhaler Inhale 2 puffs into the lungs once as needed. For shortness of breath     Historical Provider, MD  alprazolam Prudy Feeler) 2 MG tablet Take 2 mg by mouth 4 (four) times daily as needed. For anxiety    Historical Provider, MD  glipiZIDE (GLUCOTROL) 10 MG tablet Take 10 mg by mouth 2 (two) times daily.     Historical Provider, MD  ibuprofen (ADVIL,MOTRIN) 800 MG tablet Take 1 tablet (800 mg total) by mouth 3 (three) times daily. 05/28/14   Adrian Blackwater Kip Kautzman, PA-C  lisinopril (PRINIVIL,ZESTRIL) 20 MG tablet Take 20 mg by mouth daily.      Historical Provider, MD  losartan (COZAAR) 50 MG tablet Take 50 mg by mouth daily.    Historical Provider, MD  metFORMIN (GLUCOPHAGE) 500 MG tablet Take 500 mg by mouth 2 (two) times daily with a meal.    Historical Provider, MD  metoprolol (LOPRESSOR) 50 MG tablet Take 50 mg by mouth 2 (two) times daily.      Historical Provider, MD  omeprazole (PRILOSEC) 20 MG capsule Take 20 mg by mouth 2 (two) times daily.     Historical Provider, MD  sertraline (ZOLOFT) 50 MG tablet Take 50 mg  by mouth daily.      Historical Provider, MD  simvastatin (ZOCOR) 20 MG tablet Take 20 mg by mouth at bedtime.      Historical Provider, MD  traMADol (ULTRAM) 50 MG tablet Take 1 tablet (50 mg total) by mouth every 6 (six) hours as needed. 05/28/14   Adrian BlackwaterZachary H Mikyle Sox, PA-C   BP 142/90  Pulse 88  Temp(Src) 98.8 F (37.1 C) (Oral)  Resp 16  SpO2 98% Physical Exam  Nursing note and vitals reviewed. Constitutional: He is oriented to person, place, and time. He  appears well-developed and well-nourished. No distress.  HENT:  Head: Normocephalic.  Cardiovascular: Normal rate, regular rhythm and normal heart sounds.   Pulmonary/Chest: Effort normal and breath sounds normal. No respiratory distress.  Musculoskeletal:       Right hand: He exhibits tenderness (in the fingers ) and swelling (diffuse, worse in the fingers ). He exhibits normal range of motion, normal capillary refill, no deformity and no laceration. Normal sensation noted. Decreased strength (secondary to swelling ) noted.       Left hand: He exhibits tenderness (in the fingers only ) and swelling (diffuse, worse in the fingers). He exhibits normal range of motion, normal capillary refill, no deformity and no laceration. Normal sensation noted. Decreased strength (secondary to swelling ) noted.  Neurological: He is alert and oriented to person, place, and time. Coordination normal.  Skin: Skin is warm and dry. No rash noted. He is not diaphoretic.  Psychiatric: He has a normal mood and affect. Judgment normal.    ED Course  Procedures (including critical care time) Labs Review Labs Reviewed  BASIC METABOLIC PANEL - Abnormal; Notable for the following:    Glucose, Bld 252 (*)    All other components within normal limits  CBC WITH DIFFERENTIAL  SEDIMENTATION RATE  URIC ACID  D-DIMER, QUANTITATIVE    Imaging Review No results found.   MDM   1. Pain of hand, unspecified laterality   2. Bilateral hand swelling    CBC is normal, sedimentation rate is 1, uric acid is 4.2, d-dimer is negative. Still Probably some type of rheumatologic process. Giving Toradol and Depo-Medrol here and discharge him with 100 mg ibuprofen and when necessary tramadol. Referral for followup with dermatology  Meds ordered this encounter  Medications  . methylPREDNISolone acetate (DEPO-MEDROL) injection 80 mg    Sig:   . ketorolac (TORADOL) injection 60 mg    Sig:   . ibuprofen (ADVIL,MOTRIN) 800 MG  tablet    Sig: Take 1 tablet (800 mg total) by mouth 3 (three) times daily.    Dispense:  60 tablet    Refill:  0    Order Specific Question:  Supervising Provider    Answer:  Lorenz CoasterKELLER, DAVID C [id number]]  . traMADol (ULTRAM) 50 MG tablet    Sig: Take 1 tablet (50 mg total) by mouth every 6 (six) hours as needed.    Dispense:  15 tablet    Refill:  0    Order Specific Question:  Supervising Provider    Answer:  Lorenz CoasterKELLER, DAVID C [[id number]]     Graylon GoodZachary H Anabella Capshaw, PA-C 05/28/14 1553

## 2014-05-28 NOTE — ED Notes (Signed)
Patient c/o bilateral hand swelling and pain x 3 days. Left is worst than right. Has been taking Tylenol, Tramadol, and Ibuprofen (on separate occasions) for pain with no relief. Pt is alert and oriented and in no acute distress.

## 2014-06-01 NOTE — ED Provider Notes (Signed)
Medical screening examination/treatment/procedure(s) were performed by a resident physician or non-physician practitioner and as the supervising physician I was immediately available for consultation/collaboration.  Clementeen GrahamEvan Daneisha Surges, MD    Rodolph BongEvan S Mylin Hirano, MD 06/01/14 [phone]

## 2014-11-16 ENCOUNTER — Emergency Department (HOSPITAL_BASED_OUTPATIENT_CLINIC_OR_DEPARTMENT_OTHER)
Admission: EM | Admit: 2014-11-16 | Discharge: 2014-11-16 | Disposition: A | Discharge status: Home / Self Care | Payer: Worker's Compensation | Attending: Emergency Medicine | Admitting: Emergency Medicine

## 2014-11-16 ENCOUNTER — Encounter (HOSPITAL_BASED_OUTPATIENT_CLINIC_OR_DEPARTMENT_OTHER): Payer: Self-pay | Admitting: *Deleted

## 2014-11-16 ENCOUNTER — Emergency Department (HOSPITAL_BASED_OUTPATIENT_CLINIC_OR_DEPARTMENT_OTHER): Payer: Worker's Compensation

## 2014-11-16 DIAGNOSIS — Y998 Other external cause status: Secondary | ICD-10-CM | POA: Insufficient documentation

## 2014-11-16 DIAGNOSIS — E780 Pure hypercholesterolemia: Secondary | ICD-10-CM | POA: Insufficient documentation

## 2014-11-16 DIAGNOSIS — Y93K9 Activity, other involving animal care: Secondary | ICD-10-CM | POA: Insufficient documentation

## 2014-11-16 DIAGNOSIS — S60222A Contusion of left hand, initial encounter: Secondary | ICD-10-CM | POA: Insufficient documentation

## 2014-11-16 DIAGNOSIS — Y9289 Other specified places as the place of occurrence of the external cause: Secondary | ICD-10-CM | POA: Insufficient documentation

## 2014-11-16 DIAGNOSIS — Z720 Tobacco use: Secondary | ICD-10-CM | POA: Insufficient documentation

## 2014-11-16 DIAGNOSIS — W2203XA Walked into furniture, initial encounter: Secondary | ICD-10-CM | POA: Insufficient documentation

## 2014-11-16 DIAGNOSIS — Z79899 Other long term (current) drug therapy: Secondary | ICD-10-CM | POA: Insufficient documentation

## 2014-11-16 DIAGNOSIS — E119 Type 2 diabetes mellitus without complications: Secondary | ICD-10-CM | POA: Insufficient documentation

## 2014-11-16 DIAGNOSIS — T1490XA Injury, unspecified, initial encounter: Secondary | ICD-10-CM

## 2014-11-16 MED ORDER — IBUPROFEN 600 MG PO TABS: 600.0000 mg | ORAL_TABLET | Freq: Four times a day (QID) | ORAL | Status: DC | PRN

## 2014-11-16 MED ORDER — TRAMADOL HCL 50 MG PO TABS: 50.0000 mg | ORAL_TABLET | Freq: Four times a day (QID) | ORAL | Status: DC | PRN

## 2014-11-16 NOTE — Discharge Instructions (Signed)
Contusion °A contusion is a deep bruise. Contusions are the result of an injury that caused bleeding under the skin. The contusion may turn blue, purple, or yellow. Minor injuries will give you a painless contusion, but more severe contusions may stay painful and swollen for a few weeks.  °CAUSES  °A contusion is usually caused by a blow, trauma, or direct force to an area of the body. °SYMPTOMS  °· Swelling and redness of the injured area. °· Bruising of the injured area. °· Tenderness and soreness of the injured area. °· Pain. °DIAGNOSIS  °The diagnosis can be made by taking a history and physical exam. An X-ray, CT scan, or MRI may be needed to determine if there were any associated injuries, such as fractures. °TREATMENT  °Specific treatment will depend on what area of the body was injured. In general, the best treatment for a contusion is resting, icing, elevating, and applying cold compresses to the injured area. Over-the-counter medicines may also be recommended for pain control. Ask your caregiver what the best treatment is for your contusion. °HOME CARE INSTRUCTIONS  °· Put ice on the injured area. °¨ Put ice in a plastic bag. °¨ Place a towel between your skin and the bag. °¨ Leave the ice on for 15-20 minutes, 3-4 times a day, or as directed by your health care provider. °· Only take over-the-counter or prescription medicines for pain, discomfort, or fever as directed by your caregiver. Your caregiver may recommend avoiding anti-inflammatory medicines (aspirin, ibuprofen, and naproxen) for 48 hours because these medicines may increase bruising. °· Rest the injured area. °· If possible, elevate the injured area to reduce swelling. °SEEK IMMEDIATE MEDICAL CARE IF:  °· You have increased bruising or swelling. °· You have pain that is getting worse. °· Your swelling or pain is not relieved with medicines. °MAKE SURE YOU:  °· Understand these instructions. °· Will watch your condition. °· Will get help right  away if you are not doing well or get worse. °Document Released: 08/13/2005 Document Revised: 11/08/2013 Document Reviewed: 09/08/2011 °Exit[employer]® Patient Information ©2015 [employer]. This information is not intended to replace advice given to you by your health care provider. Make sure you discuss any questions you have with your health care provider. ° °

## 2014-11-16 NOTE — ED Notes (Signed)
Hit a door frame this am. Pain in his left hand.

## 2014-11-16 NOTE — ED Provider Notes (Signed)
CSN: [id number]     Arrival date & time 11/16/14  1446 History   First MD Initiated Contact with Patient 11/16/14 1702     Chief Complaint  Patient presents with  . Hand Injury     (Consider location/radiation/quality/duration/timing/severity/associated sxs/prior Treatment) HPI Comments: Patient presents with pain to his left hand. He states that earlier today he was trying to take care of his dogs in his hand for back and hit his wooden dresser. He's had constant throbbing pain to his left hand since that time. He denies any other injuries. He is taking over-the-counter medicines without relief. He's also try icing the area.  Patient is a 33 y.o. male presenting with hand injury.  Hand Injury Associated symptoms: no back pain, no fever and no neck pain     Past Medical History  Diagnosis Date  . Diabetes mellitus   . Hypertension   . Hypercholesteremia    History reviewed. No pertinent past surgical history. No family history on file. History  Substance Use Topics  . Smoking status: Current Every Day Smoker -- 1.00 packs/day  . Smokeless tobacco: Not on file  . Alcohol Use: No    Review of Systems  Constitutional: Negative for fever.  Gastrointestinal: Negative for nausea and vomiting.  Musculoskeletal: Positive for joint swelling and arthralgias. Negative for back pain and neck pain.  Skin: Negative for wound.  Neurological: Negative for weakness, numbness and headaches.      Allergies  Review of patient's allergies indicates no known allergies.  Home Medications   Prior to Admission medications   Medication Sig Start Date End Date Taking? Authorizing Provider  albuterol (PROVENTIL,VENTOLIN) 90 MCG/ACT inhaler Inhale 2 puffs into the lungs once as needed. For shortness of breath     Historical Provider, MD  alprazolam Prudy Feeler(XANAX) 2 MG tablet Take 2 mg by mouth 4 (four) times daily as needed. For anxiety    Historical Provider, MD  glipiZIDE (GLUCOTROL) 10 MG tablet  Take 10 mg by mouth 2 (two) times daily.     Historical Provider, MD  ibuprofen (ADVIL,MOTRIN) 600 MG tablet Take 1 tablet (600 mg total) by mouth every 6 (six) hours as needed. 11/16/14   Rolan BuccoMelanie Antwone Capozzoli, MD  lisinopril (PRINIVIL,ZESTRIL) 20 MG tablet Take 20 mg by mouth daily.      Historical Provider, MD  losartan (COZAAR) 50 MG tablet Take 50 mg by mouth daily.    Historical Provider, MD  metFORMIN (GLUCOPHAGE) 500 MG tablet Take 500 mg by mouth 2 (two) times daily with a meal.    Historical Provider, MD  metoprolol (LOPRESSOR) 50 MG tablet Take 50 mg by mouth 2 (two) times daily.      Historical Provider, MD  omeprazole (PRILOSEC) 20 MG capsule Take 20 mg by mouth 2 (two) times daily.     Historical Provider, MD  sertraline (ZOLOFT) 50 MG tablet Take 50 mg by mouth daily.      Historical Provider, MD  simvastatin (ZOCOR) 20 MG tablet Take 20 mg by mouth at bedtime.      Historical Provider, MD  traMADol (ULTRAM) 50 MG tablet Take 1 tablet (50 mg total) by mouth every 6 (six) hours as needed. 11/16/14   Rolan BuccoMelanie Georgianna Band, MD   BP 160/90 mmHg  Pulse 96  Temp(Src) 98.6 F (37 C) (Oral)  Resp 20  Ht 6' (1.829 m)  Wt 290 lb (131.543 kg)  BMI 39.32 kg/m2  SpO2 99% Physical Exam  Constitutional: He is oriented to person,  place, and time. He appears well-developed and well-nourished.  HENT:  Head: Normocephalic and atraumatic.  Neck: Normal range of motion. Neck supple.  Cardiovascular: Normal rate.   Pulmonary/Chest: Effort normal.  Musculoskeletal: He exhibits edema and tenderness.  There is some mild swelling and tenderness to the base of the left fifth metacarpal. There is no bony tenderness to the radius or ulna. There is no other bony tenderness to the hand. Normal sensation and motor function in the hand. Capillary refill is less than 2 distally. There is no wounds noted.  Neurological: He is alert and oriented to person, place, and time.  Skin: Skin is warm and dry.  Psychiatric: He has  a normal mood and affect.    ED Course  Procedures (including critical care time) Labs Review Labs Reviewed - No data to display  Imaging Review Dg Hand Complete Left  11/16/2014   CLINICAL DATA:  Patient hit hand on door.  Pain medially  EXAM: LEFT HAND - COMPLETE 3+ VIEW  COMPARISON:  None.  FINDINGS: Frontal, oblique, and lateral views were obtained. There is no demonstrable fracture or dislocation. Joint spaces appear intact. No erosive change.  IMPRESSION: No apparent fracture or dislocation.  No appreciable arthropathy.   Electronically Signed   By: Bretta BangWilliam  Woodruff M.D.   On: 11/16/2014 15:15     EKG Interpretation None      MDM   Final diagnoses:  Hand contusion, left, initial encounter    Patient is placed in a wrist splint. He was advised in ice and elevation. He was given a perception for ibuprofen and Ultram to use for discomfort. He was given referral to follow-up with Dr. Pearletha ForgeHudnall if his symptoms are not improving.    Rolan BuccoMelanie Dotty Gonzalo, MD 11/16/14 [phone]

## 2015-03-15 ENCOUNTER — Ambulatory Visit: Payer: Self-pay | Admitting: Cardiovascular Disease

## 2015-03-23 ENCOUNTER — Ambulatory Visit: Payer: Self-pay | Admitting: Cardiovascular Disease

## 2015-04-12 ENCOUNTER — Ambulatory Visit: Payer: Self-pay | Admitting: Cardiovascular Disease

## 2015-04-27 ENCOUNTER — Ambulatory Visit: Payer: Self-pay | Admitting: Cardiology

## 2015-05-02 ENCOUNTER — Ambulatory Visit (INDEPENDENT_AMBULATORY_CARE_PROVIDER_SITE_OTHER): Payer: Self-pay | Admitting: Cardiology

## 2015-05-02 ENCOUNTER — Encounter: Payer: Self-pay | Admitting: *Deleted

## 2015-05-02 ENCOUNTER — Encounter: Payer: Self-pay | Admitting: Cardiology

## 2015-05-02 VITALS — BP 134/86 | HR 87 | Ht 72.0 in | Wt 295.0 lb

## 2015-05-02 DIAGNOSIS — I10 Essential (primary) hypertension: Secondary | ICD-10-CM

## 2015-05-02 DIAGNOSIS — E785 Hyperlipidemia, unspecified: Secondary | ICD-10-CM

## 2015-05-02 DIAGNOSIS — E119 Type 2 diabetes mellitus without complications: Secondary | ICD-10-CM

## 2015-05-02 DIAGNOSIS — R072 Precordial pain: Secondary | ICD-10-CM

## 2015-05-02 NOTE — Progress Notes (Signed)
Cardiology Office Note  Date: 05/02/2015   ID: Edwin Ford, DOB [birth year], MRN [id number]  PCP: Alda Lea  Consulting Cardiologist: Nona Dell, MD   Chief Complaint  Patient presents with  . Chest Pain    History of Present Illness: Edwin Ford is a 34 y.o. male referred for cardiology consultation by Ms. McElroy PA-C at the [facility]. He reports having had sudden onset chest discomfort back in March. He cannot completely recall the details, but says that he was either going to bed or getting out of bed when he suddenly felt a "sharp, cramping " in his chest, also radiating across his shoulders. This lasted for about 3 minutes, and when away without any specific intervention. He thought that it may have been related to gas. He had another more mild episode later on, and since then has been symptom-free.   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 He states that he was not able to tolerate the heat. He reports NYHA class II dyspnea at baseline, no exertional chest pain. He is not exercising regularly, says that he plays with his kids outside.  He does have a family history of premature CAD in his mother. Personal cardiac risk factors include obesity, type 2 diabetes mellitus, hyperlipidemia, and hypertension.  Tracing from March is noted below. We went over his medications as well.   Past Medical History  Diagnosis Date  . Type 2 diabetes mellitus   . Essential hypertension   . GERD (gastroesophageal reflux disease)   . Hyperlipidemia     History reviewed. No pertinent past surgical history.  Current Outpatient Prescriptions  Medication Sig Dispense Refill  . albuterol (PROVENTIL,VENTOLIN) 90 MCG/ACT inhaler Inhale 2 puffs into the lungs once as needed. For shortness of breath     . alprazolam (XANAX) 2 MG tablet Take 2 mg by mouth 4 (four) times daily as needed. For anxiety     . CRESTOR 10 MG tablet TAKE 1 Tablet BY MOUTH EVERY NIGHT AT BEDTIME FOR CHOLESTEROL  3  . GLIPIZIDE XL 10 MG 24 hr tablet TAKE 1 Tablet BY MOUTH ONCE DAILY FOR DIABETES  3  . ibuprofen (ADVIL,MOTRIN) 600 MG tablet Take 1 tablet (600 mg total) by mouth every 6 (six) hours as needed. 20 tablet 0  . lisinopril (PRINIVIL,ZESTRIL) 20 MG tablet Take 20 mg by mouth daily.      Marland Kitchen losartan (COZAAR) 50 MG tablet Take 50 mg by mouth daily.    . metoprolol (LOPRESSOR) 100 MG tablet TAKE 1 Tablet  BY MOUTH TWICE DAILY  3  . Omega-3 Fatty Acids (FISH OIL) 1000 MG CAPS Take by mouth.    Marland Kitchen omeprazole (PRILOSEC) 20 MG capsule Take 20 mg by mouth 2 (two) times daily.     . sertraline (ZOLOFT) 50 MG tablet Take 100 mg by mouth 2 (two) times daily.     . traZODone (DESYREL) 100 MG tablet Take 100 mg by mouth 2 (two) times daily.    . metFORMIN (GLUCOPHAGE) 1000 MG tablet TAKE 1 Tablet  BY MOUTH TWICE DAILY  2   No current facility-administered medications for this visit.    Allergies:  Review of patient's allergies indicates no known allergies.   Social History: The patient  reports that he has been smoking Cigarettes.  He started smoking about 20 years ago. He has been smoking about 1.50 packs per day. He  does not have any smokeless tobacco history on file. He reports that he does not drink alcohol or use illicit drugs.   Family History: The patient's family history includes CAD in his mother; Diabetes Mellitus II in his father and mother; Hypertension in his mother; Kidney disease in his father.   ROS:  Please see the history of present illness. Otherwise, complete review of systems is positive for emotional and financial stress.  All other systems are reviewed and negative.   Physical Exam: VS:  BP 134/86 mmHg  Pulse 87  Ht 6' (1.829 m)  Wt 295 lb (133.811 kg)  BMI 40.00 kg/m2  SpO2 96%, BMI Body mass index is 40 kg/(m^2).  Wt Readings from Last 3 Encounters:  05/02/15 295 lb (133.811 kg)    11/16/14 290 lb (131.543 kg)  04/28/12 314 lb (142.429 kg)     General: Morbidly obese male, appears comfortable at rest. HEENT: Conjunctiva and lids normal, oropharynx clear. Neck: Increased girth without obvious elevated JVP or carotid bruits, no thyromegaly. Lungs: Clear to auscultation, nonlabored breathing at rest. Cardiac: Distant, regular rate and rhythm, no S3 or significant systolic murmur, no pericardial rub. Abdomen: Soft, nontender, bowel sounds present, no guarding or rebound. Extremities: No pitting edema, distal pulses 2+. Skin: Warm and dry. Musculoskeletal: No kyphosis. Neuropsychiatric: Alert and oriented x3, affect grossly appropriate.   ECG: Tracing from 02/06/2015 shows normal sinus rhythm.  Recent Labwork:  04/09/2015: Potassium 3.7, BUN 6, creatinine 0.6, AST 30, ALT 61, cholesterol 124, triglycerides 195, HDL 28, LDL 57, hemoglobin A1c 9.0%   ASSESSMENT AND PLAN:  1. Precordial pain as outlined above, fairly atypical in description, ECG normal at follow-up. Patient has significant cardiac risk factors however including type 2 diabetes mellitus, hypertension, hyperlipidemia, and also family history of premature disease. He has not undergone any ischemic evaluations. Plan is to proceed with a 2 day protocol Lexiscan Cardiolite for ischemic evaluation.  2. Type 2 diabetes mellitus, followed by primary care provider. Recent hemoglobin A1c 9.0%. We discussed diet, weight loss, and exercise.  3. Essential hypertension, no changes made to current regimen. Patient reports beta blocker being his most recent addition.  4. Hyperlipidemia, LDL 57 on Crestor.  Current medicines were reviewed at length with the patient today.   Orders Placed This Encounter  Procedures  . NM Myocar Multi W/Spect W/Wall Motion / EF  . Myocardial Perfusion Imaging    Disposition: Call with results.   Signed, Jonelle Sidle, MD, [facility] 05/02/2015 9:01 AM    [facility] [address] [place], [place] [address] Phone: [phone]; Fax: [phone]

## 2015-05-02 NOTE — Patient Instructions (Addendum)
Your physician recommends that you schedule a follow-up appointment in:  We will call you with test results  Your physician has requested that you have a lexiscan myoview. For further information please visit [email or web address] Please follow instruction sheet, as given.   Your physician recommends that you continue on your current medications as directed. Please refer to the Current Medication list given to you today.  Thanks for choosing [facility]!!!

## 2015-05-07 ENCOUNTER — Encounter (HOSPITAL_COMMUNITY): Payer: Self-pay

## 2015-05-09 ENCOUNTER — Telehealth: Payer: Self-pay | Admitting: Cardiology

## 2015-05-09 NOTE — Telephone Encounter (Signed)
Per NM patient called to cancel the test due to cost

## 2015-05-10 ENCOUNTER — Ambulatory Visit (HOSPITAL_COMMUNITY): Admission: RE | Admit: 2015-05-10 | Payer: Self-pay | Source: Ambulatory Visit

## 2015-05-10 ENCOUNTER — Encounter (HOSPITAL_COMMUNITY): Payer: Self-pay

## 2015-06-19 ENCOUNTER — Other Ambulatory Visit (HOSPITAL_COMMUNITY): Payer: Self-pay | Admitting: Physician Assistant

## 2015-06-19 ENCOUNTER — Ambulatory Visit (HOSPITAL_COMMUNITY)
Admission: RE | Admit: 2015-06-19 | Discharge: 2015-06-19 | Disposition: A | Discharge status: Home / Self Care | Payer: Self-pay | Source: Ambulatory Visit | Attending: Physician Assistant | Admitting: Physician Assistant

## 2015-06-19 DIAGNOSIS — M79642 Pain in left hand: Secondary | ICD-10-CM | POA: Insufficient documentation

## 2015-06-19 DIAGNOSIS — R937 Abnormal findings on diagnostic imaging of other parts of musculoskeletal system: Secondary | ICD-10-CM | POA: Insufficient documentation

## 2015-06-19 DIAGNOSIS — M7989 Other specified soft tissue disorders: Secondary | ICD-10-CM

## 2015-06-19 DIAGNOSIS — M25442 Effusion, left hand: Secondary | ICD-10-CM | POA: Insufficient documentation

## 2015-09-13 ENCOUNTER — Other Ambulatory Visit: Payer: Self-pay | Admitting: Physician Assistant

## 2015-09-13 LAB — CBC
HCT: 43.4 % (ref 39.0–52.0)
Hemoglobin: 15.2 g/dL (ref 13.0–17.0)
MCH: 30.4 pg (ref 26.0–34.0)
MCHC: 35 g/dL (ref 30.0–36.0)
MCV: 86.8 fL (ref 78.0–100.0)
MPV: 10.8 fL (ref 8.6–12.4)
PLATELETS: 202 10*3/uL (ref 150–400)
RBC: 5 MIL/uL (ref 4.22–5.81)
RDW: 13.3 % (ref 11.5–15.5)
WBC: 6.4 10*3/uL (ref 4.0–10.5)

## 2015-09-13 LAB — COMPREHENSIVE METABOLIC PANEL
ALK PHOS: 51 U/L (ref 40–115)
ALT: 55 U/L — AB (ref 9–46)
AST: 30 U/L (ref 10–40)
Albumin: 4.3 g/dL (ref 3.6–5.1)
BILIRUBIN TOTAL: 0.7 mg/dL (ref 0.2–1.2)
BUN: 11 mg/dL (ref 7–25)
CHLORIDE: 100 mmol/L (ref 98–110)
CO2: 24 mmol/L (ref 20–31)
CREATININE: 0.78 mg/dL (ref 0.60–1.35)
Calcium: 9.3 mg/dL (ref 8.6–10.3)
Glucose, Bld: 194 mg/dL — ABNORMAL HIGH (ref 65–99)
Potassium: 3.9 mmol/L (ref 3.5–5.3)
Sodium: 135 mmol/L (ref 135–146)
TOTAL PROTEIN: 7.1 g/dL (ref 6.1–8.1)

## 2015-09-13 LAB — LIPID PANEL
CHOLESTEROL: 149 mg/dL (ref 125–200)
HDL: 30 mg/dL — ABNORMAL LOW (ref >=40)
LDL Cholesterol: 83 mg/dL (ref <130)
TRIGLYCERIDES: 180 mg/dL — AB (ref <150)
Total CHOL/HDL Ratio: 5 Ratio (ref <=5.0)
VLDL: 36 mg/dL — ABNORMAL HIGH (ref <30)

## 2015-09-13 LAB — HEMOGLOBIN A1C
Hgb A1c MFr Bld: 6.9 % — ABNORMAL HIGH (ref <5.7)
Mean Plasma Glucose: 151 mg/dL — ABNORMAL HIGH (ref <117)

## 2015-09-14 LAB — MICROALBUMIN, URINE: MICROALB UR: 2.2 mg/dL

## 2015-09-18 ENCOUNTER — Ambulatory Visit: Payer: Self-pay | Admitting: Physician Assistant

## 2015-09-18 ENCOUNTER — Encounter: Payer: Self-pay | Admitting: Physician Assistant

## 2015-09-18 VITALS — BP 154/94 | HR 76 | Temp 97.3°F | Ht 72.0 in | Wt 302.2 lb

## 2015-09-18 DIAGNOSIS — I10 Essential (primary) hypertension: Secondary | ICD-10-CM

## 2015-09-18 DIAGNOSIS — E1142 Type 2 diabetes mellitus with diabetic polyneuropathy: Secondary | ICD-10-CM

## 2015-09-18 DIAGNOSIS — E785 Hyperlipidemia, unspecified: Secondary | ICD-10-CM

## 2015-09-18 DIAGNOSIS — F17210 Nicotine dependence, cigarettes, uncomplicated: Secondary | ICD-10-CM

## 2015-09-18 DIAGNOSIS — E118 Type 2 diabetes mellitus with unspecified complications: Secondary | ICD-10-CM

## 2015-09-18 MED ORDER — AMLODIPINE BESYLATE 5 MG PO TABS: 5.0000 mg | ORAL_TABLET | Freq: Every day | ORAL | Status: DC

## 2015-09-18 NOTE — Progress Notes (Signed)
BP 158/100 mmHg  Pulse 76  Temp(Src) 97.3 F (36.3 C)  Ht 6' (1.829 m)  Wt 302 lb 3.2 oz (137.077 kg)  BMI 40.98 kg/m2  SpO2 95%   Subjective:    Patient ID: Edwin Ford, male    DOB: Dec 13, 1980, 34 y.o.   MRN: [id number]  HPI: Edwin Ford is a 34 y.o. male presenting on 09/18/2015 for Diabetes   HPI Chief Complaint  Patient presents with  . Diabetes    did not bring bs log or meds, has not taken meds, states fbs has usually been in 150's    Relevant past medical, surgical, family and social history reviewed and updated as indicated. Interim medical history since our last visit reviewed. Allergies and medications reviewed and updated.   Current outpatient prescriptions:  .  albuterol (PROVENTIL,VENTOLIN) 90 MCG/ACT inhaler, Inhale 2 puffs into the lungs once as needed. For shortness of breath , Disp: , Rfl:  .  CRESTOR 10 MG tablet, TAKE 1 Tablet BY MOUTH EVERY NIGHT AT BEDTIME FOR CHOLESTEROL, Disp: , Rfl: 3 .  GLIPIZIDE XL 10 MG 24 hr tablet, TAKE 1 Tablet BY MOUTH ONCE DAILY FOR DIABETES, Disp: , Rfl: 3 .  insulin glargine (LANTUS) 100 UNIT/ML injection, Inject 46 Units into the skin at bedtime., Disp: , Rfl:  .  lisinopril (PRINIVIL,ZESTRIL) 20 MG tablet, Take 20 mg by mouth daily.  , Disp: , Rfl:  .  metFORMIN (GLUCOPHAGE) 1000 MG tablet, TAKE 1 Tablet  BY MOUTH TWICE DAILY, Disp: , Rfl: 2 .  metoprolol (LOPRESSOR) 100 MG tablet, TAKE 1 Tablet  BY MOUTH TWICE DAILY, Disp: , Rfl: 3 .  omeprazole (PRILOSEC) 20 MG capsule, Take 20 mg by mouth 2 (two) times daily. , Disp: , Rfl:  .  sitaGLIPtin (JANUVIA) 100 MG tablet, Take 100 mg by mouth daily., Disp: , Rfl:  .  alprazolam (XANAX) 2 MG tablet, Take 2 mg by mouth 4 (four) times daily as needed. For anxiety, Disp: , Rfl:  .  Omega-3 Fatty Acids (FISH OIL) 1000 MG CAPS, Take by mouth., Disp: , Rfl:  .  sertraline (ZOLOFT) 50 MG tablet, Take 100 mg by mouth 2 (two) times daily. , Disp: , Rfl:    Review of Systems   Constitutional: Positive for fatigue. Negative for fever, chills, diaphoresis, appetite change and unexpected weight change.  HENT: Positive for congestion and sneezing. Negative for dental problem, drooling, ear pain, facial swelling, hearing loss, mouth sores, sore throat, trouble swallowing and voice change.   Eyes: Negative for pain, discharge, redness, itching and visual disturbance.  Respiratory: Positive for cough, shortness of breath and wheezing. Negative for choking.   Cardiovascular: Positive for chest pain and leg swelling. Negative for palpitations.  Gastrointestinal: Negative for vomiting, abdominal pain, diarrhea, constipation and blood in stool.  Endocrine: Positive for polydipsia. Negative for cold intolerance and heat intolerance.  Genitourinary: Negative for dysuria, hematuria and decreased urine volume.  Musculoskeletal: Positive for back pain and arthralgias. Negative for gait problem.  Skin: Negative for rash.  Allergic/Immunologic: Positive for environmental allergies.  Neurological: Negative for seizures, syncope, light-headedness and headaches.  Hematological: Negative for adenopathy.  Psychiatric/Behavioral: Positive for dysphoric mood and agitation. Negative for suicidal ideas. The patient is nervous/anxious.     Per HPI unless specifically indicated above     Objective:    BP 158/100 mmHg  Pulse 76  Temp(Src) 97.3 F (36.3 C)  Ht 6' (1.829 m)  Wt 302 lb 3.2  oz (137.077 kg)  BMI 40.98 kg/m2  SpO2 95%  Wt Readings from Last 3 Encounters:  09/18/15 302 lb 3.2 oz (137.077 kg)  05/02/15 295 lb (133.811 kg)  11/16/14 290 lb (131.543 kg)    Physical Exam  Constitutional: He is oriented to person, place, and time. He appears well-developed and well-nourished.  HENT:  Head: Normocephalic and atraumatic.  Neck: Neck supple.  Cardiovascular: Normal rate and regular rhythm.   Pulmonary/Chest: Effort normal and breath sounds normal. He has no wheezes.   Abdominal: Soft. Bowel sounds are normal. There is no tenderness.  obese  Musculoskeletal: He exhibits no edema.  Lymphadenopathy:    He has no cervical adenopathy.  Neurological: He is alert and oriented to person, place, and time.  Skin: Skin is warm and dry.  Psychiatric: He has a normal mood and affect. His behavior is normal.  Vitals reviewed.   Results for orders placed or performed in visit on 09/13/15  CBC  Result Value Ref Range   WBC 6.4 4.0 - 10.5 K/uL   RBC 5.00 4.22 - 5.81 MIL/uL   Hemoglobin 15.2 13.0 - 17.0 g/dL   HCT 63.8 75.6 - 43.3 %   MCV 86.8 78.0 - 100.0 fL   MCH 30.4 26.0 - 34.0 pg   MCHC 35.0 30.0 - 36.0 g/dL   RDW 29.5 18.8 - 41.6 %   Platelets 202 150 - 400 K/uL   MPV 10.8 8.6 - 12.4 fL  Comprehensive metabolic panel  Result Value Ref Range   Sodium 135 135 - 146 mmol/L   Potassium 3.9 3.5 - 5.3 mmol/L   Chloride 100 98 - 110 mmol/L   CO2 24 20 - 31 mmol/L   Glucose, Bld 194 (H) 65 - 99 mg/dL   BUN 11 7 - 25 mg/dL   Creat 6.06 3.01 - 6.01 mg/dL   Total Bilirubin 0.7 0.2 - 1.2 mg/dL   Alkaline Phosphatase 51 40 - 115 U/L   AST 30 10 - 40 U/L   ALT 55 (H) 9 - 46 U/L   Total Protein 7.1 6.1 - 8.1 g/dL   Albumin 4.3 3.6 - 5.1 g/dL   Calcium 9.3 8.6 - 09.3 mg/dL  Lipid panel  Result Value Ref Range   Cholesterol 149 125 - 200 mg/dL   Triglycerides 235 (H) <150 mg/dL   HDL 30 (L) >=57 mg/dL   Total CHOL/HDL Ratio 5.0 <=5.0 Ratio   VLDL 36 (H) <30 mg/dL   LDL Cholesterol 83 <322 mg/dL  Hemoglobin G2R  Result Value Ref Range   Hgb A1c MFr Bld 6.9 (H) <5.7 %   Mean Plasma Glucose 151 (H) <117 mg/dL  Microalbumin, urine  Result Value Ref Range   Microalb, Ur 2.2 Not estab mg/dL      Assessment & Plan:   Encounter Diagnoses  Name Primary?  . Type 2 diabetes mellitus with complication, unspecified long term insulin use status (HCC) Yes  . Essential hypertension, benign   . Hyperlipemia   . Diabetic polyneuropathy associated with type 2  diabetes mellitus (HCC)   . Cigarette nicotine dependence, uncomplicated   . Morbid obesity, unspecified obesity type (HCC)     -reviewed labs with pt  -Get back on fish oil -pt encouraged to F/u with psychiatrist -Add amlodipine- rx sent to [facility] - counseled on Smoking cessation -F/u 3 mo. rto soonre prn

## 2015-09-18 NOTE — Patient Instructions (Signed)
Smoking Cessation, Tips for Success If you are ready to quit smoking, congratulations! You have chosen to help yourself be healthier. Cigarettes bring nicotine, tar, carbon monoxide, and other irritants into your body. Your lungs, heart, and blood vessels will be able to work better without these poisons. There are many different ways to quit smoking. Nicotine gum, nicotine patches, a nicotine inhaler, or nicotine nasal spray can help with physical craving. Hypnosis, support groups, and medicines help break the habit of smoking. WHAT THINGS CAN I DO TO MAKE QUITTING EASIER?  Here are some tips to help you quit for good:  Pick a date when you will quit smoking completely. Tell all of your friends and family about your plan to quit on that date.  Do not try to slowly cut down on the number of cigarettes you are smoking. Pick a quit date and quit smoking completely starting on that day.  Throw away all cigarettes.   Clean and remove all ashtrays from your home, work, and car.  On a card, write down your reasons for quitting. Carry the card with you and read it when you get the urge to smoke.  Cleanse your body of nicotine. Drink enough water and fluids to keep your urine clear or pale yellow. Do this after quitting to flush the nicotine from your body.  Learn to predict your moods. Do not let a bad situation be your excuse to have a cigarette. Some situations in your life might tempt you into wanting a cigarette.  Never have "just one" cigarette. It leads to wanting another and another. Remind yourself of your decision to quit.  Change habits associated with smoking. If you smoked while driving or when feeling stressed, try other activities to replace smoking. Stand up when drinking your coffee. Brush your teeth after eating. Sit in a different chair when you read the paper. Avoid alcohol while trying to quit, and try to drink fewer caffeinated beverages. Alcohol and caffeine may urge you to  smoke.  Avoid foods and drinks that can trigger a desire to smoke, such as sugary or spicy foods and alcohol.  Ask people who smoke not to smoke around you.  Have something planned to do right after eating or having a cup of coffee. For example, plan to take a walk or exercise.  Try a relaxation exercise to calm you down and decrease your stress. Remember, you may be tense and nervous for the first 2 weeks after you quit, but this will pass.  Find new activities to keep your hands busy. Play with a pen, coin, or rubber band. Doodle or draw things on paper.  Brush your teeth right after eating. This will help cut down on the craving for the taste of tobacco after meals. You can also try mouthwash.   Use oral substitutes in place of cigarettes. Try using lemon drops, carrots, cinnamon sticks, or chewing gum. Keep them handy so they are available when you have the urge to smoke.  When you have the urge to smoke, try deep breathing.  Designate your home as a nonsmoking area.  If you are a heavy smoker, ask your health care provider about a prescription for nicotine chewing gum. It can ease your withdrawal from nicotine.  Reward yourself. Set aside the cigarette money you save and buy yourself something nice.  Look for support from others. Join a support group or smoking cessation program. Ask someone at home or at work to help you with your plan   to quit smoking.  Always ask yourself, "Do I need this cigarette or is this just a reflex?" Tell yourself, "Today, I choose not to smoke," or "I do not want to smoke." You are reminding yourself of your decision to quit.  Do not replace cigarette smoking with electronic cigarettes (commonly called e-cigarettes). The safety of e-cigarettes is unknown, and some may contain harmful chemicals.  If you relapse, do not give up! Plan ahead and think about what you will do the next time you get the urge to smoke. HOW WILL I FEEL WHEN I QUIT SMOKING? You  may have symptoms of withdrawal because your body is used to nicotine (the addictive substance in cigarettes). You may crave cigarettes, be irritable, feel very hungry, cough often, get headaches, or have difficulty concentrating. The withdrawal symptoms are only temporary. They are strongest when you first quit but will go away within 10-14 days. When withdrawal symptoms occur, stay in control. Think about your reasons for quitting. Remind yourself that these are signs that your body is healing and getting used to being without cigarettes. Remember that withdrawal symptoms are easier to treat than the major diseases that smoking can cause.  Even after the withdrawal is over, expect periodic urges to smoke. However, these cravings are generally short lived and will go away whether you smoke or not. Do not smoke! WHAT RESOURCES ARE AVAILABLE TO HELP ME QUIT SMOKING? Your health care provider can direct you to community resources or hospitals for support, which may include:  Group support.  Education.  Hypnosis.  Therapy.   This information is not intended to replace advice given to you by your health care provider. Make sure you discuss any questions you have with your health care provider.   Document Released: 08/01/2004 Document Revised: 11/24/2014 Document Reviewed: 04/21/2013 Elsevier Interactive Patient Education 2016 Elsevier Inc.  

## 2015-09-19 DIAGNOSIS — E118 Type 2 diabetes mellitus with unspecified complications: Secondary | ICD-10-CM | POA: Insufficient documentation

## 2015-09-19 DIAGNOSIS — F17210 Nicotine dependence, cigarettes, uncomplicated: Secondary | ICD-10-CM | POA: Insufficient documentation

## 2015-09-19 DIAGNOSIS — E785 Hyperlipidemia, unspecified: Secondary | ICD-10-CM | POA: Insufficient documentation

## 2015-09-19 DIAGNOSIS — I10 Essential (primary) hypertension: Secondary | ICD-10-CM | POA: Insufficient documentation

## 2015-10-07 ENCOUNTER — Other Ambulatory Visit: Payer: Self-pay | Admitting: Physician Assistant

## 2015-10-08 ENCOUNTER — Other Ambulatory Visit: Payer: Self-pay | Admitting: Physician Assistant

## 2015-10-08 MED ORDER — ATORVASTATIN CALCIUM 20 MG PO TABS: 20.0000 mg | ORAL_TABLET | Freq: Every day | ORAL | Status: DC

## 2015-10-08 MED ORDER — OMEPRAZOLE 20 MG PO CPDR: 20.0000 mg | DELAYED_RELEASE_CAPSULE | Freq: Two times a day (BID) | ORAL | Status: DC

## 2015-10-25 ENCOUNTER — Other Ambulatory Visit: Payer: Self-pay | Admitting: Physician Assistant

## 2015-10-25 MED ORDER — METOPROLOL TARTRATE 100 MG PO TABS: 100.0000 mg | ORAL_TABLET | Freq: Two times a day (BID) | ORAL | Status: DC

## 2015-10-25 MED ORDER — ATORVASTATIN CALCIUM 20 MG PO TABS: 20.0000 mg | ORAL_TABLET | Freq: Every day | ORAL | Status: DC

## 2015-10-25 MED ORDER — ALBUTEROL SULFATE HFA 108 (90 BASE) MCG/ACT IN AERS: 2.0000 | INHALATION_SPRAY | Freq: Four times a day (QID) | RESPIRATORY_TRACT | Status: DC | PRN

## 2015-10-25 MED ORDER — GLIPIZIDE XL 10 MG PO TB24: 10.0000 mg | ORAL_TABLET | Freq: Every day | ORAL | Status: DC

## 2015-10-25 MED ORDER — OMEPRAZOLE 20 MG PO CPDR
20.0000 mg | DELAYED_RELEASE_CAPSULE | Freq: Two times a day (BID) | ORAL | Status: DC
Start: 2015-10-25 — End: 2016-05-27

## 2015-10-25 MED ORDER — SITAGLIPTIN PHOSPHATE 100 MG PO TABS: 100.0000 mg | ORAL_TABLET | Freq: Every day | ORAL | Status: DC

## 2015-10-25 MED ORDER — LISINOPRIL 20 MG PO TABS: 20.0000 mg | ORAL_TABLET | Freq: Every day | ORAL | Status: DC

## 2015-10-25 MED ORDER — METFORMIN HCL 1000 MG PO TABS: 1000.0000 mg | ORAL_TABLET | Freq: Two times a day (BID) | ORAL | Status: DC

## 2015-11-13 ENCOUNTER — Other Ambulatory Visit: Payer: Self-pay | Admitting: Physician Assistant

## 2015-11-13 MED ORDER — GLIPIZIDE 10 MG PO TABS: 10.0000 mg | ORAL_TABLET | Freq: Two times a day (BID) | ORAL | Status: DC

## 2015-11-25 ENCOUNTER — Emergency Department (HOSPITAL_COMMUNITY)
Admission: EM | Admit: 2015-11-25 | Discharge: 2015-11-26 | Disposition: A | Discharge status: Home / Self Care | Payer: Self-pay | Attending: Emergency Medicine | Admitting: Emergency Medicine

## 2015-11-25 ENCOUNTER — Encounter (HOSPITAL_COMMUNITY): Payer: Self-pay

## 2015-11-25 ENCOUNTER — Emergency Department (HOSPITAL_COMMUNITY): Payer: Self-pay

## 2015-11-25 ENCOUNTER — Other Ambulatory Visit: Payer: Self-pay

## 2015-11-25 DIAGNOSIS — Z79899 Other long term (current) drug therapy: Secondary | ICD-10-CM | POA: Insufficient documentation

## 2015-11-25 DIAGNOSIS — R1030 Lower abdominal pain, unspecified: Secondary | ICD-10-CM | POA: Insufficient documentation

## 2015-11-25 DIAGNOSIS — R61 Generalized hyperhidrosis: Secondary | ICD-10-CM | POA: Insufficient documentation

## 2015-11-25 DIAGNOSIS — R110 Nausea: Secondary | ICD-10-CM | POA: Insufficient documentation

## 2015-11-25 DIAGNOSIS — K219 Gastro-esophageal reflux disease without esophagitis: Secondary | ICD-10-CM | POA: Insufficient documentation

## 2015-11-25 DIAGNOSIS — E785 Hyperlipidemia, unspecified: Secondary | ICD-10-CM | POA: Insufficient documentation

## 2015-11-25 DIAGNOSIS — I10 Essential (primary) hypertension: Secondary | ICD-10-CM | POA: Insufficient documentation

## 2015-11-25 DIAGNOSIS — F17210 Nicotine dependence, cigarettes, uncomplicated: Secondary | ICD-10-CM | POA: Insufficient documentation

## 2015-11-25 DIAGNOSIS — Z794 Long term (current) use of insulin: Secondary | ICD-10-CM | POA: Insufficient documentation

## 2015-11-25 DIAGNOSIS — E860 Dehydration: Secondary | ICD-10-CM | POA: Insufficient documentation

## 2015-11-25 DIAGNOSIS — E119 Type 2 diabetes mellitus without complications: Secondary | ICD-10-CM | POA: Insufficient documentation

## 2015-11-25 LAB — CBC
HCT: 46.6 % (ref 39.0–52.0)
Hemoglobin: 17 g/dL (ref 13.0–17.0)
MCH: 30.2 pg (ref 26.0–34.0)
MCHC: 36.5 g/dL — ABNORMAL HIGH (ref 30.0–36.0)
MCV: 82.8 fL (ref 78.0–100.0)
Platelets: 185 10*3/uL (ref 150–400)
RBC: 5.63 MIL/uL (ref 4.22–5.81)
RDW: 12.4 % (ref 11.5–15.5)
WBC: 8.2 10*3/uL (ref 4.0–10.5)

## 2015-11-25 LAB — BASIC METABOLIC PANEL
ANION GAP: 12 (ref 5–15)
BUN: 6 mg/dL (ref 6–20)
CALCIUM: 9.5 mg/dL (ref 8.9–10.3)
CO2: 21 mmol/L — AB (ref 22–32)
Chloride: 98 mmol/L — ABNORMAL LOW (ref 101–111)
Creatinine, Ser: 0.87 mg/dL (ref 0.61–1.24)
GFR calc Af Amer: >60 mL/min (ref >60)
GFR calc non Af Amer: >60 mL/min (ref >60)
GLUCOSE: 266 mg/dL — AB (ref 65–99)
POTASSIUM: 3.9 mmol/L (ref 3.5–5.1)
Sodium: 131 mmol/L — ABNORMAL LOW (ref 135–145)

## 2015-11-25 LAB — URINALYSIS, ROUTINE W REFLEX MICROSCOPIC
Bilirubin Urine: NEGATIVE
GLUCOSE, UA: 250 mg/dL — AB
KETONES UR: 15 mg/dL — AB
LEUKOCYTES UA: NEGATIVE
Nitrite: NEGATIVE
PH: 5 (ref 5.0–8.0)
Protein, ur: NEGATIVE mg/dL
SPECIFIC GRAVITY, URINE: 1.017 (ref 1.005–1.030)

## 2015-11-25 LAB — CBG MONITORING, ED: Glucose-Capillary: 237 mg/dL — ABNORMAL HIGH (ref 65–99)

## 2015-11-25 LAB — URINE MICROSCOPIC-ADD ON

## 2015-11-25 LAB — I-STAT TROPONIN, ED: Troponin i, poc: 0 ng/mL (ref 0.00–0.08)

## 2015-11-25 MED ORDER — IOHEXOL 300 MG/ML  SOLN
100.0000 mL | Freq: Once | INTRAMUSCULAR | Status: AC | PRN
  Administered 2015-11-25: 100 mL via INTRAVENOUS

## 2015-11-25 MED ORDER — SODIUM CHLORIDE 0.9 % IV BOLUS (SEPSIS)
1000.0000 mL | Freq: Once | INTRAVENOUS | Status: AC
  Administered 2015-11-25: 1000 mL via INTRAVENOUS

## 2015-11-25 MED ORDER — PROMETHAZINE HCL 25 MG PO TABS: 25.0000 mg | ORAL_TABLET | Freq: Four times a day (QID) | ORAL | Status: DC | PRN

## 2015-11-25 NOTE — ED Provider Notes (Signed)
CSN: [id number]     Arrival date & time 11/25/15  1221 History   First MD Initiated Contact with Patient 11/25/15 1402     Chief Complaint  Patient presents with  . Nausea  . Night Sweats    The history is provided by the patient. No language interpreter was used.   Edwin Ford is a 35 y.o. male who presents to the Emergency Department complaining of nausea and night sweats. Symptoms began 2 days ago while at dinner. He developed an episode of diarrhea followed by significant nausea and intermittent sweats. When he has these episodes of sweating he feels uncomfortable in these 2 pace. He has decreased oral intake and decreased appetite, poor sleep. No fevers, chest pain, shortness of breath, vomiting, dysuria, skin rash, sick contacts. No prior similar symptoms. He has a history of hypertension, hyperlipidemia, diabetes. Symptoms are moderate, constant, worsening.  Past Medical History  Diagnosis Date  . Type 2 diabetes mellitus (HCC)   . Essential hypertension   . GERD (gastroesophageal reflux disease)   . Hyperlipidemia    History reviewed. No pertinent past surgical history. Family History  Problem Relation Age of Onset  . CAD Mother     CABG at age 7  . Hypertension Mother   . Diabetes Mellitus II Mother   . Diabetes Mother   . Kidney disease Father   . Diabetes Mellitus II Father   . Diabetes Father    Social History  Substance Use Topics  . Smoking status: Current Every Day Smoker -- 0.50 packs/day for 20 years    Types: Cigarettes    Start date: 11/17/1994  . Smokeless tobacco: Former Neurosurgeon    Types: Chew  . Alcohol Use: No    Review of Systems  All other systems reviewed and are negative.     Allergies  Review of patient's allergies indicates no known allergies.  Home Medications   Prior to Admission medications   Medication Sig Start Date End Date Taking? Authorizing Provider  albuterol (PROVENTIL,VENTOLIN) 90 MCG/ACT inhaler Inhale 2 puffs into the  lungs once as needed. For shortness of breath    Yes Historical Provider, MD  amLODipine (NORVASC) 5 MG tablet Take 1 tablet (5 mg total) by mouth daily. 09/18/15  Yes Shannon McElroy, PA-C  CRESTOR 10 MG tablet TAKE 1 Tablet BY MOUTH EVERY NIGHT AT BEDTIME FOR CHOLESTEROL 04/17/15  Yes Historical Provider, MD  glipiZIDE (GLUCOTROL) 10 MG tablet Take 1 tablet (10 mg total) by mouth 2 (two) times daily before a meal. Patient taking differently: Take 10 mg by mouth daily.  11/13/15  Yes Jacquelin Hawking, PA-C  insulin glargine (LANTUS) 100 UNIT/ML injection Inject 46 Units into the skin at bedtime.   Yes Historical Provider, MD  lisinopril (PRINIVIL,ZESTRIL) 20 MG tablet Take 1 tablet (20 mg total) by mouth daily. 10/25/15  Yes Jacquelin Hawking, PA-C  metFORMIN (GLUCOPHAGE) 1000 MG tablet Take 1 tablet (1,000 mg total) by mouth 2 (two) times daily with a meal. 10/25/15  Yes Jacquelin Hawking, PA-C  metoprolol (LOPRESSOR) 100 MG tablet Take 1 tablet (100 mg total) by mouth 2 (two) times daily. 10/25/15  Yes Jacquelin Hawking, PA-C  sertraline (ZOLOFT) 100 MG tablet Take 200 mg by mouth daily.   Yes Historical Provider, MD  sitaGLIPtin (JANUVIA) 100 MG tablet Take 1 tablet (100 mg total) by mouth daily. 10/25/15  Yes Jacquelin Hawking, PA-C  albuterol (PROVENTIL HFA;VENTOLIN HFA) 108 (90 BASE) MCG/ACT inhaler Inhale 2 puffs into the lungs  every 6 (six) hours as needed for wheezing or shortness of breath. 10/25/15   Jacquelin HawkingShannon McElroy, PA-C  alprazolam Prudy Feeler(XANAX) 2 MG tablet Take 2 mg by mouth 4 (four) times daily as needed. For anxiety    Historical Provider, MD  Omega-3 Fatty Acids (FISH OIL) 1000 MG CAPS Take by mouth.    Historical Provider, MD  omeprazole (PRILOSEC) 20 MG capsule Take 1 capsule (20 mg total) by mouth 2 (two) times daily. Patient taking differently: Take 20 mg by mouth 2 (two) times daily as needed.  10/25/15   Jacquelin HawkingShannon McElroy, PA-C  promethazine (PHENERGAN) 25 MG tablet Take 1 tablet (25 mg total) by  mouth every 6 (six) hours as needed for nausea or vomiting. 11/25/15   Tilden FossaElizabeth Rajah Lamba, MD  sertraline (ZOLOFT) 50 MG tablet Take 100 mg by mouth 2 (two) times daily.     Historical Provider, MD   BP 156/85 mmHg  Pulse 74  Temp(Src) 98.1 F (36.7 C) (Oral)  Resp 11  Ht 6' (1.829 m)  Wt 288 lb 4.8 oz (130.772 kg)  BMI 39.09 kg/m2  SpO2 98% Physical Exam  Constitutional: He is oriented to person, place, and time. He appears well-developed and well-nourished.  Uncomfortable appearing  HENT:  Head: Normocephalic and atraumatic.  Cardiovascular: Normal rate and regular rhythm.   No murmur heard. Pulmonary/Chest: Effort normal and breath sounds normal. No respiratory distress.  Abdominal: Soft. There is no rebound and no guarding.  Mild lower abdominal tenderness  Musculoskeletal: He exhibits no edema or tenderness.  Neurological: He is alert and oriented to person, place, and time.  Skin: Skin is warm. He is diaphoretic.  Psychiatric: He has a normal mood and affect. His behavior is normal.  Nursing note and vitals reviewed.   ED Course  Procedures (including critical care time) Labs Review Labs Reviewed  BASIC METABOLIC PANEL - Abnormal; Notable for the following:    Sodium 131 (*)    Chloride 98 (*)    CO2 21 (*)    Glucose, Bld 266 (*)    All other components within normal limits  CBC - Abnormal; Notable for the following:    MCHC 36.5 (*)    All other components within normal limits  URINALYSIS, ROUTINE W REFLEX MICROSCOPIC (NOT AT [facility]) - Abnormal; Notable for the following:    Glucose, UA 250 (*)    Hgb urine dipstick TRACE (*)    Ketones, ur 15 (*)    All other components within normal limits  URINE MICROSCOPIC-ADD ON - Abnormal; Notable for the following:    Squamous Epithelial / LPF 0-5 (*)    Bacteria, UA RARE (*)    Casts HYALINE CASTS (*)    All other components within normal limits  CBG MONITORING, ED - Abnormal; Notable for the following:     Glucose-Capillary 237 (*)    All other components within normal limits  I-STAT TROPOININ, ED    Imaging Review Dg Chest 2 View  11/25/2015  CLINICAL DATA:  Night sweats for 2 days. EXAM: CHEST  2 VIEW COMPARISON:  Single view of the chest 04/28/2012. FINDINGS: The lungs are clear. Heart size is normal. No pneumothorax or pleural effusion. No focal bony abnormality. IMPRESSION: Negative chest. Electronically Signed   By: Drusilla Kannerhomas  Dalessio M.D.   On: 11/25/2015 15:14   Ct Abdomen Pelvis W Contrast  11/25/2015  CLINICAL DATA:  Hot sweaty clammy nausea no appetite runny nose for 2 days EXAM: CT ABDOMEN AND PELVIS WITH CONTRAST  TECHNIQUE: Multidetector CT imaging of the abdomen and pelvis was performed using the standard protocol following bolus administration of intravenous contrast. CONTRAST:  OMNIPAQUE IOHEXOL 300 MG/ML  SOLN COMPARISON:  11/29/2006 FINDINGS: Lower chest:  Normal Hepatobiliary: Moderate diffuse hepatic steatosis Pancreas: Normal Spleen: No significant abnormalities.  2 cm splenule noted. Adrenals/Urinary Tract: Normal Stomach/Bowel: Normal including appendix Vascular/Lymphatic: No vascular abnormalities. No significant abdominal or pelvic adenopathy. Reproductive: Normal Other: No ascites Musculoskeletal: No acute findings. Moderate calcified disc osteophytic bulge L5-S1 causing canal narrowing. Multilevel degenerative disc disease lower thoracic spine and upper lumbar spine. IMPRESSION: No acute findings to explain the patient's symptoms. Hepatic steatosis as previously described. Electronically Signed   By: Esperanza Heir M.D.   On: 11/25/2015 18:52   I have personally reviewed and evaluated these images and lab results as part of my medical decision-making.   EKG Interpretation   Date/Time:  Sunday November 25 2015 15:48:11 EST Ventricular Rate:  71 PR Interval:  139 QRS Duration: 103 QT Interval:  395 QTC Calculation: 429 R Axis:   52 Text Interpretation:  Sinus rhythm  TWI in lead III Reconfirmed by Lincoln Brigham [phone]) on 11/25/2015 7:11:08 PM      MDM   Final diagnoses:  Nausea  Dehydration   Patient here for evaluation of intermittent diaphoresis with associated nausea. He is nontoxic appearing on examination. He is mildly dehydrated again his labs with hyponatremia and hyperglycemia. Provided IV fluids. His diaphoresis resolved in the emergency department and he felt improved. Presentation is not consistent with ACS, hypertensive urgency, acute abdomen. Discussed with patient unclear source of his diaphoresis. Discussed close outpatient follow-up as well as return precautions.  Tilden Fossa, MD 11/25/15 2337

## 2015-11-25 NOTE — Discharge Instructions (Signed)
Do not take your metformin for the next 48 hours.  Drink plenty of fluids.  You were dehydrated today with a low sodium.  Please get rechecked by your family doctor in the next week.  Get rechecked immediately if you develop fevers, chest pain, difficulty breathing or new concerning symptoms.     Nausea, Adult Nausea is the feeling that you have an upset stomach or have to vomit. Nausea by itself is not likely a serious concern, but it may be an early sign of more serious medical problems. As nausea gets worse, it can lead to vomiting. If vomiting develops, there is the risk of dehydration.  CAUSES   Viral infections.  Food poisoning.  Medicines.  Pregnancy.  Motion sickness.  Migraine headaches.  Emotional distress.  Severe pain from any source.  Alcohol intoxication. HOME CARE INSTRUCTIONS  Get plenty of rest.  Ask your caregiver about specific rehydration instructions.  Eat small amounts of food and sip liquids more often.  Take all medicines as told by your caregiver. SEEK MEDICAL CARE IF:  You have not improved after 2 days, or you get worse.  You have a headache. SEEK IMMEDIATE MEDICAL CARE IF:   You have a fever.  You faint.  You keep vomiting or have blood in your vomit.  You are extremely weak or dehydrated.  You have dark or bloody stools.  You have severe chest or abdominal pain. MAKE SURE YOU:  Understand these instructions.  Will watch your condition.  Will get help right away if you are not doing well or get worse.   This information is not intended to replace advice given to you by your health care provider. Make sure you discuss any questions you have with your health care provider.   Document Released: 12/11/2004 Document Revised: 11/24/2014 Document Reviewed: 07/16/2011 Elsevier Interactive Patient Education [employer]2016 Elsevier Inc.

## 2015-11-25 NOTE — ED Notes (Addendum)
Onset 2 days pt reports he just doesn't feel good, can't sleep, gets hot, sweaty and clammy, nausea, no appetitie, runny nose.  No fevers, cough, body aches, headache, vomiting, diarrhea.  Pt checked BS this morning- 259.

## 2015-11-25 NOTE — ED Notes (Signed)
Pt states has been breaking out into sweats since Friday -- nauseated with one episode diarrhea Friday night . Pt states has had cramping in abd, without diarrhea-- pt is also admittedly anxious -- once he starts sweating, pt becomes anxious and pacing around room, not sleeping, not eating. Hx anxiety attacks.

## 2015-11-26 ENCOUNTER — Emergency Department (HOSPITAL_COMMUNITY)
Admission: EM | Admit: 2015-11-26 | Discharge: 2015-11-26 | Disposition: A | Discharge status: Home / Self Care | Payer: Self-pay | Attending: Emergency Medicine | Admitting: Emergency Medicine

## 2015-11-26 ENCOUNTER — Encounter (HOSPITAL_COMMUNITY): Payer: Self-pay | Admitting: *Deleted

## 2015-11-26 ENCOUNTER — Other Ambulatory Visit: Payer: Self-pay

## 2015-11-26 DIAGNOSIS — Z794 Long term (current) use of insulin: Secondary | ICD-10-CM | POA: Insufficient documentation

## 2015-11-26 DIAGNOSIS — K219 Gastro-esophageal reflux disease without esophagitis: Secondary | ICD-10-CM | POA: Insufficient documentation

## 2015-11-26 DIAGNOSIS — R61 Generalized hyperhidrosis: Secondary | ICD-10-CM | POA: Insufficient documentation

## 2015-11-26 DIAGNOSIS — R0789 Other chest pain: Secondary | ICD-10-CM | POA: Insufficient documentation

## 2015-11-26 DIAGNOSIS — Z720 Tobacco use: Secondary | ICD-10-CM

## 2015-11-26 DIAGNOSIS — R197 Diarrhea, unspecified: Secondary | ICD-10-CM | POA: Insufficient documentation

## 2015-11-26 DIAGNOSIS — F419 Anxiety disorder, unspecified: Secondary | ICD-10-CM | POA: Insufficient documentation

## 2015-11-26 DIAGNOSIS — R739 Hyperglycemia, unspecified: Secondary | ICD-10-CM

## 2015-11-26 DIAGNOSIS — Z79899 Other long term (current) drug therapy: Secondary | ICD-10-CM | POA: Insufficient documentation

## 2015-11-26 DIAGNOSIS — Z7984 Long term (current) use of oral hypoglycemic drugs: Secondary | ICD-10-CM | POA: Insufficient documentation

## 2015-11-26 DIAGNOSIS — I10 Essential (primary) hypertension: Secondary | ICD-10-CM | POA: Insufficient documentation

## 2015-11-26 DIAGNOSIS — E1165 Type 2 diabetes mellitus with hyperglycemia: Secondary | ICD-10-CM | POA: Insufficient documentation

## 2015-11-26 DIAGNOSIS — F17210 Nicotine dependence, cigarettes, uncomplicated: Secondary | ICD-10-CM | POA: Insufficient documentation

## 2015-11-26 LAB — BASIC METABOLIC PANEL
Anion gap: 12 (ref 5–15)
BUN: 8 mg/dL (ref 6–20)
CHLORIDE: 102 mmol/L (ref 101–111)
CO2: 21 mmol/L — AB (ref 22–32)
CREATININE: 0.83 mg/dL (ref 0.61–1.24)
Calcium: 9.7 mg/dL (ref 8.9–10.3)
GFR calc non Af Amer: >60 mL/min (ref >60)
GLUCOSE: 263 mg/dL — AB (ref 65–99)
Potassium: 4 mmol/L (ref 3.5–5.1)
Sodium: 135 mmol/L (ref 135–145)

## 2015-11-26 LAB — CBC
HCT: 46.2 % (ref 39.0–52.0)
Hemoglobin: 16.8 g/dL (ref 13.0–17.0)
MCH: 30.1 pg (ref 26.0–34.0)
MCHC: 36.4 g/dL — ABNORMAL HIGH (ref 30.0–36.0)
MCV: 82.8 fL (ref 78.0–100.0)
PLATELETS: 190 10*3/uL (ref 150–400)
RBC: 5.58 MIL/uL (ref 4.22–5.81)
RDW: 12.4 % (ref 11.5–15.5)
WBC: 7.8 10*3/uL (ref 4.0–10.5)

## 2015-11-26 LAB — I-STAT TROPONIN, ED: Troponin i, poc: 0 ng/mL (ref 0.00–0.08)

## 2015-11-26 MED ORDER — ALPRAZOLAM 0.5 MG PO TABS
1.0000 mg | ORAL_TABLET | Freq: Once | ORAL | Status: AC
  Administered 2015-11-26: 1 mg via ORAL
  Filled 2015-11-26: qty 2

## 2015-11-26 MED ORDER — ALPRAZOLAM 1 MG PO TABS: 1.0000 mg | ORAL_TABLET | Freq: Three times a day (TID) | ORAL | Status: DC | PRN

## 2015-11-26 NOTE — ED Notes (Signed)
Pt was seen here yesterday for flu symptoms, now reports still having episodes of mid chest pain that occurs more at night time. Has also been of his anxiety meds. No acute distress noted at triage, ekg done.

## 2015-11-26 NOTE — ED Notes (Signed)
Pt. Tearful and reports he is scared because his wife recently got a job. He hasn't been home without her in over 10 years and is scared of being alone. Pt. Recently abruptly stopped taking zoloft because of lack of insurance. He started taking zoloft again 11/12/15. Pt. Hx of 1mg  xanax twice daily. Hasn't had xanax in one month. Pt. Wishes to receive materials for psych help/clinic.

## 2015-11-26 NOTE — Discharge Instructions (Signed)
Your labs and imaging today are unremarkable and reassuring. Your symptoms are likely from anxiety and being off your medications. Take your home medications as directed, including xanax and zoloft. Stop smoking. Use the list below to find a behavioral health specialist to help with your psychiatric care. Stay well hydrated with small sips of fluids throughout the day. Follow a BRAT (banana-rice-applesauce-toast) diet as described below for the next 24-48 hours. The 'BRAT' diet is suggested, then progress to diet as tolerated as symptoms abate. Call the sickle cell clinic listed above if bloody stools, persistent diarrhea, vomiting, fever or abdominal pain. Follow up with sickle cell clinic as discussed by the care manager on 12/17/15 for recheck of symptoms and ongoing medical care. Go to the [place] [facility] pharmacy for help with medication refills. Return to ER for changing or worsening of symptoms.  Food Choices to Help Relieve Diarrhea When you have diarrhea, the foods you eat and your eating habits are very important. Choosing the right foods and drinks can help relieve diarrhea. Also, because diarrhea can last up to 7 days, you need to replace lost fluids and electrolytes (such as sodium, potassium, and chloride) in order to help prevent dehydration.  WHAT GENERAL GUIDELINES DO I NEED TO FOLLOW?  Slowly drink 1 cup (8 oz) of fluid for each episode of diarrhea. If you are getting enough fluid, your urine will be clear or pale yellow.  Eat starchy foods. Some good choices include white rice, white toast, pasta, low-fiber cereal, baked potatoes (without the skin), saltine crackers, and bagels.  Avoid large servings of any cooked vegetables.  Limit fruit to two servings per day. A serving is  cup or 1 small piece.  Choose foods with less than 2 g of fiber per serving.  Limit fats to less than 8 tsp (38 g) per day.  Avoid fried foods.  Eat foods that have probiotics in them. Probiotics  can be found in certain dairy products.  Avoid foods and beverages that may increase the speed at which food moves through the stomach and intestines (gastrointestinal tract). Things to avoid include:  High-fiber foods, such as dried fruit, raw fruits and vegetables, nuts, seeds, and whole grain foods.  Spicy foods and high-fat foods.  Foods and beverages sweetened with high-fructose corn syrup, honey, or sugar alcohols such as xylitol, sorbitol, and mannitol. WHAT FOODS ARE RECOMMENDED? Grains White rice. White, [place], or pita breads (fresh or toasted), including plain rolls, buns, or bagels. White pasta. Saltine, soda, or graham crackers. Pretzels. Low-fiber cereal. Cooked cereals made with water (such as cornmeal, farina, or cream cereals). Plain muffins. Matzo. Melba toast. Zwieback.  Vegetables Potatoes (without the skin). Strained tomato and vegetable juices. Most well-cooked and canned vegetables without seeds. Tender lettuce. Fruits Cooked or canned applesauce, apricots, cherries, fruit cocktail, grapefruit, peaches, pears, or plums. Fresh bananas, apples without skin, cherries, grapes, cantaloupe, grapefruit, peaches, oranges, or plums.  Meat and Other Protein Products Baked or boiled chicken. Eggs. Tofu. Fish. Seafood. Smooth peanut butter. Ground or well-cooked tender beef, ham, veal, lamb, pork, or poultry.  Dairy Plain yogurt, kefir, and unsweetened liquid yogurt. Lactose-free milk, buttermilk, or soy milk. Plain hard cheese. Beverages Sport drinks. Clear broths. Diluted fruit juices (except prune). Regular, caffeine-free sodas such as ginger ale. Water. Decaffeinated teas. Oral rehydration solutions. Sugar-free beverages not sweetened with sugar alcohols. Other Bouillon, broth, or soups made from recommended foods.  The items listed above may not be a complete list of recommended  foods or beverages. Contact your dietitian for more options. WHAT FOODS ARE NOT  RECOMMENDED? Grains Whole grain, whole wheat, bran, or rye breads, rolls, pastas, crackers, and cereals. Wild or brown rice. Cereals that contain more than 2 g of fiber per serving. Corn tortillas or taco shells. Cooked or dry oatmeal. Granola. Popcorn. Vegetables Raw vegetables. Cabbage, broccoli, Brussels sprouts, artichokes, baked beans, beet greens, corn, kale, legumes, peas, sweet potatoes, and yams. Potato skins. Cooked spinach and cabbage. Fruits Dried fruit, including raisins and dates. Raw fruits. Stewed or dried prunes. Fresh apples with skin, apricots, mangoes, pears, raspberries, and strawberries.  Meat and Other Protein Products Chunky peanut butter. Nuts and seeds. Beans and lentils. [place].  Dairy High-fat cheeses. Milk, chocolate milk, and beverages made with milk, such as milk shakes. Cream. Ice cream. Sweets and Desserts Sweet rolls, doughnuts, and sweet breads. Pancakes and waffles. Fats and Oils Butter. Cream sauces. Margarine. Salad oils. Plain salad dressings. Olives. Avocados.  Beverages Caffeinated beverages (such as coffee, tea, soda, or energy drinks). Alcoholic beverages. Fruit juices with pulp. Prune juice. Soft drinks sweetened with high-fructose corn syrup or sugar alcohols. Other Coconut. Hot sauce. Chili powder. Mayonnaise. Gravy. Cream-based or milk-based soups.  The items listed above may not be a complete list of foods and beverages to avoid. Contact your dietitian for more information. WHAT SHOULD I DO IF I BECOME DEHYDRATED? Diarrhea can sometimes lead to dehydration. Signs of dehydration include dark urine and dry mouth and skin. If you think you are dehydrated, you should rehydrate with an oral rehydration solution. These solutions can be purchased at pharmacies, retail stores, or online.  Drink -1 cup (120-240 mL) of oral rehydration solution each time you have an episode of diarrhea. If drinking this amount makes your diarrhea worse, try drinking smaller  amounts more often. For example, drink 1-3 tsp (5-15 mL) every 5-10 minutes.  A general rule for staying hydrated is to drink 1-2 L of fluid per day. Talk to your health care provider about the specific amount you should be drinking each day. Drink enough fluids to keep your urine clear or pale yellow. Document Released: 01/24/2004 Document Revised: 11/08/2013 Document Reviewed: 09/26/2013 [facility] Patient Information 2015 [place], [place]. This information is not intended to replace advice given to you by your health care provider. Make sure you discuss any questions you have with your health care provider.   Emergency Department Resource Guide 1) Find a Doctor and Pay Out of Pocket Although you won't have to find out who is covered by your insurance plan, it is a good idea to ask around and get recommendations. You will then need to call the office and see if the doctor you have chosen will accept you as a new patient and what types of options they offer for patients who are self-pay. Some doctors offer discounts or will set up payment plans for their patients who do not have insurance, but you will need to ask so you aren't surprised when you get to your appointment.  2) Contact Your Local Health Department Not all health departments have doctors that can see patients for sick visits, but many do, so it is worth a call to see if yours does. If you don't know where your local health department is, you can check in your phone book. The CDC also has a tool to help you locate your state's health department, and many state websites also have listings of all of their local health departments.  3)  Find a Walk-in Clinic If your illness is not likely to be very severe or complicated, you may want to try a walk in clinic. These are popping up all over the country in pharmacies, drugstores, and shopping centers. They're usually staffed by nurse practitioners or physician assistants that have been trained to  treat common illnesses and complaints. They're usually fairly quick and inexpensive. However, if you have serious medical issues or chronic medical problems, these are probably not your best option.  No Primary Care Doctor: - Call Health Connect at  [phone] - they can help you locate a primary care doctor that  accepts your insurance, provides certain services, etc. - Physician Referral Service- [phone]  Chronic Pain Problems: Organization         Address  Phone   Notes  [facility]  [phone] Patients need to be referred by their primary care doctor.   Medication Assistance: Organization         Address  Phone   Notes  [facility] Medication [facility] [address] [place] [place], [place] [address] [phone] --Must be a resident of [facility] -- Must have NO insurance coverage whatsoever (no Medicaid/ Medicare, etc.) -- The pt. MUST have a primary care doctor that directs their care regularly and follows them in the community   [facility]  [phone]   [employer]  [phone]    Agencies that provide inexpensive medical care: Organization         Address  Phone   Notes  [facility]  [phone]   [facility]    [phone]   [facility] [address] [place], [place] [address] [phone]   [facility] [address] [place] [place] [phone]   Planned Parenthood    [phone]   [facility]    [phone]   [facility]  [address], [place] Phone:  [phone], Fax:  [phone] Hours of Operation:  9 am - 6 pm, M-F.  Also accepts Medicaid/Medicare and self-pay.  [facility]  [address], [place] Phone: [phone], Fax: [phone]. Hours of Operation:  8:30 am - 5:30 pm, M-F.  Also accepts Medicaid and self-pay.  [facility]  [place] [address], [place] Phone: [phone]   [facility] [address] Natasha Bence [place], [place] [phone], Ext. [phone] Mondays & Thursdays: 7-9 AM.  First 15 patients are seen on a first come, first serve basis.    Medicaid-accepting [facility] Providers:  Organization         Address  Phone   Notes  [facility] [address], [place] [phone] Also accepts self-pay patients.  [facility] [address] Laurell Joseph[place], [place]  [phone]   [facility] [address], [place] [phone]   [facility] [address] [place] [address], [place] [phone]   Renaye Rakers [address], [place]   [phone] Only accepts [place] Access [place] patients after they have their name applied to their card.   Self-Pay (no insurance) in [facility]:  Organization         Address  Phone   Notes  Sickle Cell Patients, [facility] [address], [place] [phone]   [facility] [address] [place], [place] [phone]   [facility] [place]  [address], [place] [phone]   [facility]/Dr. Osei-Bonsu  [address] [place] or [address], [place] [phone] Phone number for both [place] and [place] locations is the same.  [facility] [address] [place] [phone]   [facility] [address], [place] or [address] [phone]   [facility] [address] [place] [phone], phone; [phone], fax Sees patients 1st and 3rd Saturday of every month.  Must not qualify for public or private insurance (i.e. Medicaid, Medicare, [place] Health Choice, Veterans' Benefits)  Household income should be no more than 200%  of the poverty level The clinic cannot treat you if you are pregnant or think you are pregnant  Sexually transmitted diseases are not treated at the clinic.    Dental Care: Organization         Address  Phone  Notes  [facility] Department of [facility] [address] [place], [place] [phone] Accepts children up to age 48 who are enrolled in [place] or [place] Health Choice; pregnant women with a Medicaid card; and children who have applied for Medicaid or [place] Health Choice, but were declined, whose parents can pay a reduced fee at time of service.  [facility] Department of [facility]  [address], [place] [phone] Accepts children up to age 33 who are enrolled in [place] or [place] Health Choice; pregnant women with a Medicaid card; and children who have applied for Medicaid or  Health Choice, but were declined, whose parents can pay a reduced fee at time of service.  [facility]  [address] [place], [place] [phone] Patients are seen by appointment only. Walk-ins are not accepted. [facility] will see patients 101 years of age and older. Monday - Tuesday (8am-5pm) Most Wednesdays (8:30-5pm) $30 per visit, cash only  [facility] Adult Dental Access PROGRAM  [address], [facility] [phone] Patients are seen by appointment only. Walk-ins are not accepted. [facility] will see patients 39 years of age and older. One Wednesday Evening (Monthly: Volunteer Based).  $30 per visit, cash only  [employer]  [phone] for adults; Children under age 55, call Graduate Pediatric Dentistry at [phone]. Children aged 41-14, please call [phone] to request a pediatric application.  Dental services are provided in all areas of dental care including fillings, crowns and bridges, complete and partial dentures, implants, gum treatment, root canals, and  extractions. Preventive care is also provided. Treatment is provided to both adults and children. Patients are selected via a lottery and there is often a waiting list.   [facility] [address], [place]  [phone] [email or web address]   [facility] [address] [place], [place] [phone], Ext. [phone] Second and Fourth Thursday of each month, opens at 6:30 AM; Clinic ends at 9 AM.  Patients are seen on a first-come first-served basis, and a limited number are seen during each clinic.   [facility]  [address] Ether Griffins [place], [place] [phone]   Eligibility Requirements You must have lived in [place], [place],  or [place] counties for at least the last three months.   You cannot be eligible for state or federal sponsored [place], including [employer], [place], or [employer].   You generally cannot be eligible for healthcare insurance through your employer.    How to apply: Eligibility screenings are held every Tuesday and Wednesday afternoon from 1:00 pm until 4:00 pm. You do not need an appointment for the interview!  [facility] [address], [place], [place] [phone]   [facility] Health Department  [phone]   [facility] Health Department  [phone]   [facility] Health Department  [phone]    Behavioral Health Resources in the Community: Intensive Outpatient Programs Organization         Address  Phone  Notes  [facility] [address], [place], [place] [phone]   [facility] Outpatient [address] [place], [place] [phone]   [facility] [address], [place], [place]  [phone]   [facility] [address],  [place], [place] [phone] or [phone]   Substance Abuse Resources Organization         Address  Phone  Notes  [facility]  [phone]    [facility]  [phone]   [facility] [place]  [phone]   Floydene Flock  [phone]   Residential & Outpatient Substance Abuse Program  [phone]   Psychological Services Organization         Address  Phone  Notes  [facility]  [phone]   [facility] Services  [phone]   [facility] [address] [place] [phone] or [phone]    Mobile Crisis Teams Organization         Address  Phone  Notes  [facility], Mobile Crisis Care Unit  [phone]   [facility]  [address] [place], [place] [phone]   Doristine Locks [address] [place] [phone]    Self-Help/Support Groups Organization         Address  Phone             Notes  [facility] [place] - variety of support groups  [phone]- [phone] Call for more information  Narcotics Anonymous (NA), [facility] [address], [employer] [place]  2 meetings at this location   Statistician         Address  Phone  Notes  [facility] [address],    [place]  [phone]   [facility]  [address] [place], [place] [address], [place], [place] [phone]   [facility] Treatment Facility [address] [place], [place] [phone] Admissions: 8am-3pm M-F  [facility] [address]    [place], [place] [phone]   [facility] [address] Starling Manns [place], [place] [phone]   [facility] [address],  [place], [place] [phone]   [facility] - Intensive Outpatient [address]urell [address], [place], [place] [phone]   [facility] ([facility]iction [facility] [address] [place]  [place], [place] [phone] or [phone]   [facility] ([facility]) [address], [place], [place] [phone] Accepts Medicaid  [facility] [address],   [place] [phone] Substance Abuse/Addiction Treatment   [facility] Resources Organization         Address  Phone  Notes  [facility]  [phone]   Angie Fava, PhD [address] Ervin Knack [place], [place]   [phone] or [phone]   [facility] Behavioral   [address] [place], [place] [phone]   [facility] Recovery [address] [place], [place] [phone] Insurance/Medicaid/sponsorship through [facility] [address]                                    [place], [place] [phone] Therapy/tele-psych/case  [facility] [address], [place] [phone]    Dr. Lolly Mustache  [phone]   [facility]  [employer] [facility] Dept. 1) [address] [place], [place] 2) [address] [place] 3)  [address], [place] [phone]  [phone]   [facility] Child Abuse Hotline [phone] [phone] or [phone]) [phone] (After Hours)       Nonspecific Chest Pain  Chest pain can be caused by many different conditions. There is always a chance that your pain could be related to something serious, such as a heart attack or a blood clot in your lungs. Chest pain can also be caused by conditions that are not life-threatening. If you have chest pain, it is very important to follow up with your health care provider. CAUSES  Chest pain can be caused by:  Heartburn.  Pneumonia or bronchitis.  Anxiety or stress.  Inflammation around your heart (pericarditis) or lung (pleuritis or pleurisy).  A blood clot in your lung.  A collapsed lung (pneumothorax). It can develop suddenly on its own (spontaneous pneumothorax) or from trauma to the chest.  Shingles infection (varicella-zoster virus).  Heart attack.  Damage to the bones, muscles, and cartilage that make up your chest wall. This can include:  Bruised bones due to  injury.  Strained muscles or cartilage due to frequent or repeated coughing or overwork.  Fracture to one or more ribs.  Sore cartilage due to inflammation (costochondritis). RISK FACTORS  Risk factors for chest pain may include:  Activities that increase your risk for trauma or injury to your chest.  Respiratory infections or conditions that cause frequent coughing.  Medical conditions or overeating that can cause heartburn.  Heart disease or family history of heart disease.  Conditions or health behaviors that increase your risk of developing a blood clot.  Having had chicken pox (varicella zoster). SIGNS AND SYMPTOMS Chest pain can feel like:  Burning or tingling on the surface of your chest or deep in your chest.  Crushing, pressure, aching, or squeezing pain.  Dull or sharp pain that is worse when you move, cough, or take a deep breath.  Pain that is also felt in your back, neck, shoulder, or arm, or pain that spreads to any of these areas. Your chest pain may come and go, or it may stay constant. DIAGNOSIS Lab tests or other studies may be needed to find the cause of your pain. Your health care provider may have you take a test called an ambulatory ECG (electrocardiogram). An ECG records your heartbeat patterns at the time the test is performed. You may also have other tests, such as:  Transthoracic echocardiogram (TTE). During echocardiography, sound waves are used to create a picture of all of the heart structures and to look at how blood flows through your heart.  Transesophageal echocardiogram (TEE).This is a more advanced imaging test that obtains images from inside your body. It allows your health care provider to see your heart in finer detail.  Cardiac monitoring. This allows your health care provider to monitor your heart rate and rhythm in real time.  Holter monitor. This is a portable device that records your heartbeat and can help to diagnose abnormal  heartbeats. It allows your health care provider to track your heart activity for several days, if needed.  Stress tests. These can be done through exercise or by taking medicine that makes your heart beat more quickly.  Blood tests.  Imaging tests. TREATMENT  Your treatment depends on what is causing your chest pain. Treatment may include:  Medicines. These may include:  Acid blockers for heartburn.  Anti-inflammatory medicine.  Pain medicine for inflammatory conditions.  Antibiotic medicine, if an infection is present.  Medicines to dissolve blood clots.  Medicines to treat coronary artery disease.  Supportive care for conditions that do not require medicines. This may include:  Resting.  Applying heat or cold packs to injured areas.  Limiting activities until pain decreases. HOME CARE INSTRUCTIONS  If you were prescribed an antibiotic medicine, finish it all even if you start to feel better.  Avoid any activities that bring on chest pain.  Do not use any tobacco products, including cigarettes, chewing tobacco, or electronic cigarettes. If you need help quitting, ask your health care provider.  Do not drink alcohol.  Take medicines only as directed by your health care provider.  Keep all follow-up visits as directed by your health care provider. This is important. This includes any further testing if your chest pain does not go away.  If heartburn is the cause for your chest pain, you may be told to keep your head raised (elevated) while sleeping. This reduces the chance that acid will go from your stomach into your esophagus.  Make lifestyle changes as directed by your health care provider. These may include:  Getting regular exercise. Ask your health care provider to suggest some activities that are safe for you.  Eating a heart-healthy diet. A registered dietitian can help you to learn healthy eating options.  Maintaining a healthy weight.  Managing  diabetes, if necessary.  Reducing stress. SEEK MEDICAL CARE IF:  Your chest pain does not go away after treatment.  You have a rash with blisters on your chest.  You have a fever. SEEK IMMEDIATE MEDICAL CARE IF:   Your chest pain is worse.  You have an increasing cough, or you cough up blood.  You have severe abdominal pain.  You have severe weakness.  You faint.  You have chills.  You have sudden, unexplained chest discomfort.  You have sudden, unexplained discomfort in your arms, back, neck, or jaw.  You have shortness of breath at any time.  You suddenly start to sweat, or your skin gets clammy.  You feel nauseous or you vomit.  You suddenly feel light-headed or dizzy.  Your heart begins to beat quickly, or it feels like it is skipping beats. These symptoms may represent a serious problem that is an emergency. Do not wait to see if the symptoms will go away. Get medical help right away. Call your local emergency services (911 in the [place] Do not drive yourself to the hospital.   This information is not intended to replace advice given to you by your health care provider. Make sure you discuss any questions you have with  your health care provider.   Document Released: 08/13/2005 Document Revised: 11/24/2014 Document Reviewed: 06/09/2014 Elsevier Interactive Patient Education 2016 [employer]  Panic Attacks Panic attacks are sudden, short feelings of great fear or discomfort. You may have them for no reason when you are relaxed, when you are uneasy (anxious), or when you are sleeping.  HOME CARE  Take all your medicines as told.  Check with your doctor before starting new medicines.  Keep all doctor visits. GET HELP IF:  You are not able to take your medicines as told.  Your symptoms do not get better.  Your symptoms get worse. GET HELP RIGHT AWAY IF:  Your attacks seem different than your normal attacks.  You have thoughts about hurting yourself  or others.  You take panic attack medicine and you have a side effect. MAKE SURE YOU:  Understand these instructions.  Will watch your condition.  Will get help right away if you are not doing well or get worse.   This information is not intended to replace advice given to you by your health care provider. Make sure you discuss any questions you have with your health care provider.   Document Released: 12/06/2010 Document Revised: 08/24/2013 Document Reviewed: 06/17/2013 Elsevier Interactive Patient Education 2016 [employer].  Smoking Cessation, Tips for Success If you are ready to quit smoking, congratulations! You have chosen to help yourself be healthier. Cigarettes bring nicotine, tar, carbon monoxide, and other irritants into your body. Your lungs, heart, and blood vessels will be able to work better without these poisons. There are many different ways to quit smoking. Nicotine gum, nicotine patches, a nicotine inhaler, or nicotine nasal spray can help with physical craving. Hypnosis, support groups, and medicines help break the habit of smoking. WHAT THINGS CAN I DO TO MAKE QUITTING EASIER?  Here are some tips to help you quit for good:  Pick a date when you will quit smoking completely. Tell all of your friends and family about your plan to quit on that date.  Do not try to slowly cut down on the number of cigarettes you are smoking. Pick a quit date and quit smoking completely starting on that day.  Throw away all cigarettes.   Clean and remove all ashtrays from your home, work, and car.  On a card, write down your reasons for quitting. Carry the card with you and read it when you get the urge to smoke.  Cleanse your body of nicotine. Drink enough water and fluids to keep your urine clear or pale yellow. Do this after quitting to flush the nicotine from your body.  Learn to predict your moods. Do not let a bad situation be your excuse to have a cigarette. Some situations  in your life might tempt you into wanting a cigarette.  Never have "just one" cigarette. It leads to wanting another and another. Remind yourself of your decision to quit.  Change habits associated with smoking. If you smoked while driving or when feeling stressed, try other activities to replace smoking. Stand up when drinking your coffee. Brush your teeth after eating. Sit in a different chair when you read the paper. Avoid alcohol while trying to quit, and try to drink fewer caffeinated beverages. Alcohol and caffeine may urge you to smoke.  Avoid foods and drinks that can trigger a desire to smoke, such as sugary or spicy foods and alcohol.  Ask people who smoke not to smoke around you.  Have something planned to do  right after eating or having a cup of coffee. For example, plan to take a walk or exercise.  Try a relaxation exercise to calm you down and decrease your stress. Remember, you may be tense and nervous for the first 2 weeks after you quit, but this will pass.  Find new activities to keep your hands busy. Play with a pen, coin, or rubber band. Doodle or draw things on paper.  Brush your teeth right after eating. This will help cut down on the craving for the taste of tobacco after meals. You can also try mouthwash.   Use oral substitutes in place of cigarettes. Try using lemon drops, carrots, cinnamon sticks, or chewing gum. Keep them handy so they are available when you have the urge to smoke.  When you have the urge to smoke, try deep breathing.  Designate your home as a nonsmoking area.  If you are a heavy smoker, ask your health care provider about a prescription for nicotine chewing gum. It can ease your withdrawal from nicotine.  Reward yourself. Set aside the cigarette money you save and buy yourself something nice.  Look for support from others. Join a support group or smoking cessation program. Ask someone at home or at work to help you with your plan to quit  smoking.  Always ask yourself, "Do I need this cigarette or is this just a reflex?" Tell yourself, "Today, I choose not to smoke," or "I do not want to smoke." You are reminding yourself of your decision to quit.  Do not replace cigarette smoking with electronic cigarettes (commonly called e-cigarettes). The safety of e-cigarettes is unknown, and some may contain harmful chemicals.  If you relapse, do not give up! Plan ahead and think about what you will do the next time you get the urge to smoke. HOW WILL I FEEL WHEN I QUIT SMOKING? You may have symptoms of withdrawal because your body is used to nicotine (the addictive substance in cigarettes). You may crave cigarettes, be irritable, feel very hungry, cough often, get headaches, or have difficulty concentrating. The withdrawal symptoms are only temporary. They are strongest when you first quit but will go away within 10-14 days. When withdrawal symptoms occur, stay in control. Think about your reasons for quitting. Remind yourself that these are signs that your body is healing and getting used to being without cigarettes. Remember that withdrawal symptoms are easier to treat than the major diseases that smoking can cause.  Even after the withdrawal is over, expect periodic urges to smoke. However, these cravings are generally short lived and will go away whether you smoke or not. Do not smoke! WHAT RESOURCES ARE AVAILABLE TO HELP ME QUIT SMOKING? Your health care provider can direct you to community resources or hospitals for support, which may include:  Group support.  Education.  Hypnosis.  Therapy.   This information is not intended to replace advice given to you by your health care provider. Make sure you discuss any questions you have with your health care provider.   Document Released: 08/01/2004 Document Revised: 11/24/2014 Document Reviewed: 04/21/2013 Elsevier Interactive Patient Education 2016 [employer]  Diarrhea Diarrhea  is watery poop (stool). It can make you feel weak, tired, thirsty, or give you a dry mouth (signs of dehydration). Watery poop is a sign of another problem, most often an infection. It often lasts 2-3 days. It can last longer if it is a sign of something serious. Take care of yourself as told by your  doctor. HOME CARE   Drink 1 cup (8 ounces) of fluid each time you have watery poop.  Do not drink the following fluids:  Those that contain simple sugars (fructose, glucose, galactose, lactose, sucrose, maltose).  Sports drinks.  Fruit juices.  Whole milk products.  Sodas.  Drinks with caffeine (coffee, tea, soda) or alcohol.  Oral rehydration solution may be used if the doctor says it is okay. You may make your own solution. Follow this recipe:   - teaspoon table salt.   teaspoon baking soda.   teaspoon salt substitute containing potassium chloride.  1 tablespoons sugar.  1 liter (34 ounces) of water.  Avoid the following foods:  High fiber foods, such as raw fruits and vegetables.  Nuts, seeds, and whole grain breads and cereals.   Those that are sweetened with sugar alcohols (xylitol, sorbitol, mannitol).  Try eating the following foods:  Starchy foods, such as rice, toast, pasta, low-sugar cereal, oatmeal, baked potatoes, crackers, and bagels.  Bananas.  Applesauce.  Eat probiotic-rich foods, such as yogurt and milk products that are fermented.  Wash your hands well after each time you have watery poop.  Only take medicine as told by your doctor.  Take a warm bath to help lessen burning or pain from having watery poop. GET HELP RIGHT AWAY IF:   You cannot drink fluids without throwing up (vomiting).  You keep throwing up.  You have blood in your poop, or your poop looks black and tarry.  You do not pee (urinate) in 6-8 hours, or there is only a small amount of very dark pee.  You have belly (abdominal) pain that gets worse or stays in the same spot  (localizes).  You are weak, dizzy, confused, or light-headed.  You have a very bad headache.  Your watery poop gets worse or does not get better.  You have a fever or lasting symptoms for more than 2-3 days.  You have a fever and your symptoms suddenly get worse. MAKE SURE YOU:   Understand these instructions.  Will watch your condition.  Will get help right away if you are not doing well or get worse.   This information is not intended to replace advice given to you by your health care provider. Make sure you discuss any questions you have with your health care provider.   Document Released: 04/21/2008 Document Revised: 11/24/2014 Document Reviewed: 07/11/2012 Elsevier Interactive Patient Education [employer]

## 2015-11-26 NOTE — ED Provider Notes (Signed)
CSN: [id number]     Arrival date & time 11/26/15  1013 History   First MD Initiated Contact with Patient 11/26/15 1206     Chief Complaint  Patient presents with  . Chest Pain     (Consider location/radiation/quality/duration/timing/severity/associated sxs/prior Treatment) HPI Comments: Edwin Ford is a 35 y.o. male with a PMHx of DM2, HTN, GERD, and HLD, who presents to the ED with complaints of 3 days of intermittent chest tightness that occurs mostly at night. Patient states that he feels very anxious and thinks that his anxiety is to blame for his symptoms, stating that he stopped taking his Zoloft and Xanax proximally a month ago due to not being able to afford his medications. He states that at night when he lays down he develops worsening anxiety, palpitations, diaphoresis/chills, and 9/10 central chest tightness which is intermittent and nonradiating worse with laying down and improved with ibuprofen. He also states he has had 2 episodes of nonbloody diarrhea today which occurred once on Friday as well. He is a smoker with a positive family history of MI in his maternal grandmother and CAD in his mom. Patient states that his wife recently got a job and this is causing him more stress because he is afraid of being home alone. He denies any HI/SI/AVH, but states that he would like psychiatric help information. Chart review reveals that pt was here yesterday for flu like symptoms, and had a neg trop, unremarkable EKG, and unremarkable CXR.   He denies any fevers, lightheadedness, shortness of breath, cough, wheezing, leg swelling, recent travel/surgery/immobilization, history of DVT/PE, claudication, orthopnea, abdominal pain, nausea, vomiting, melena, hematochezia, obstipation, constipation, dysuria, hematuria, numbness, tingling, or weakness.  Of note, he has not taken his insulin today yet.  Patient is a 35 y.o. male presenting with chest pain. The history is provided by the patient and  medical records. No language interpreter was used.  Chest Pain Pain location:  Substernal area Pain quality: tightness   Pain radiates to:  Does not radiate Pain radiates to the back: no   Pain severity:  Moderate Onset quality:  Gradual Duration:  3 days Timing:  Intermittent Progression:  Unchanged Chronicity:  New Context: at rest   Relieved by:  Nothing Worsened by:  Certain positions (laying down at night) Ineffective treatments: ibuprofen. Associated symptoms: anxiety, diaphoresis (sweats at night with chest tightness) and palpitations   Associated symptoms: no abdominal pain, no claudication, no cough, no fever, no lower extremity edema, no nausea, no numbness, no orthopnea, no shortness of breath, not vomiting and no weakness   Risk factors: diabetes mellitus, high cholesterol, hypertension, male sex and smoking   Risk factors: no coronary artery disease, no immobilization, no prior DVT/PE and no surgery     Past Medical History  Diagnosis Date  . Type 2 diabetes mellitus (HCC)   . Essential hypertension   . GERD (gastroesophageal reflux disease)   . Hyperlipidemia    History reviewed. No pertinent past surgical history. Family History  Problem Relation Age of Onset  . CAD Mother     CABG at age 37  . Hypertension Mother   . Diabetes Mellitus II Mother   . Diabetes Mother   . Kidney disease Father   . Diabetes Mellitus II Father   . Diabetes Father    Social History  Substance Use Topics  . Smoking status: Current Every Day Smoker -- 0.50 packs/day for 20 years    Types: Cigarettes    Start  date: 11/17/1994  . Smokeless tobacco: Former Neurosurgeon    Types: Chew  . Alcohol Use: No    Review of Systems  Constitutional: Positive for chills (at night) and diaphoresis (sweats at night with chest tightness). Negative for fever.  Respiratory: Positive for chest tightness. Negative for cough, shortness of breath and wheezing.   Cardiovascular: Positive for chest pain  (tightness) and palpitations. Negative for orthopnea, claudication and leg swelling.  Gastrointestinal: Positive for diarrhea (2x). Negative for nausea, vomiting, abdominal pain, constipation and blood in stool.  Genitourinary: Negative for dysuria and hematuria.  Musculoskeletal: Negative for myalgias and arthralgias.  Skin: Negative for color change.  Allergic/Immunologic: Positive for immunocompromised state (diabetic).  Neurological: Negative for weakness, light-headedness and numbness.  Psychiatric/Behavioral: Negative for suicidal ideas, hallucinations and confusion. The patient is nervous/anxious.    10 Systems reviewed and are negative for acute change except as noted in the HPI.    Allergies  Review of patient's allergies indicates no known allergies.  Home Medications   Prior to Admission medications   Medication Sig Start Date End Date Taking? Authorizing Provider  albuterol (PROVENTIL HFA;VENTOLIN HFA) 108 (90 BASE) MCG/ACT inhaler Inhale 2 puffs into the lungs every 6 (six) hours as needed for wheezing or shortness of breath. 10/25/15   Jacquelin Hawking, PA-C  albuterol (PROVENTIL,VENTOLIN) 90 MCG/ACT inhaler Inhale 2 puffs into the lungs once as needed. For shortness of breath     Historical Provider, MD  alprazolam Prudy Feeler) 2 MG tablet Take 2 mg by mouth 4 (four) times daily as needed. For anxiety    Historical Provider, MD  amLODipine (NORVASC) 5 MG tablet Take 1 tablet (5 mg total) by mouth daily. 09/18/15   Shannon McElroy, PA-C  CRESTOR 10 MG tablet TAKE 1 Tablet BY MOUTH EVERY NIGHT AT BEDTIME FOR CHOLESTEROL 04/17/15   Historical Provider, MD  glipiZIDE (GLUCOTROL) 10 MG tablet Take 1 tablet (10 mg total) by mouth 2 (two) times daily before a meal. Patient taking differently: Take 10 mg by mouth daily.  11/13/15   Jacquelin Hawking, PA-C  insulin glargine (LANTUS) 100 UNIT/ML injection Inject 46 Units into the skin at bedtime.    Historical Provider, MD  lisinopril  (PRINIVIL,ZESTRIL) 20 MG tablet Take 1 tablet (20 mg total) by mouth daily. 10/25/15   Jacquelin Hawking, PA-C  metFORMIN (GLUCOPHAGE) 1000 MG tablet Take 1 tablet (1,000 mg total) by mouth 2 (two) times daily with a meal. 10/25/15   Jacquelin Hawking, PA-C  metoprolol (LOPRESSOR) 100 MG tablet Take 1 tablet (100 mg total) by mouth 2 (two) times daily. 10/25/15   Jacquelin Hawking, PA-C  Omega-3 Fatty Acids (FISH OIL) 1000 MG CAPS Take by mouth.    Historical Provider, MD  omeprazole (PRILOSEC) 20 MG capsule Take 1 capsule (20 mg total) by mouth 2 (two) times daily. Patient taking differently: Take 20 mg by mouth 2 (two) times daily as needed.  10/25/15   Jacquelin Hawking, PA-C  promethazine (PHENERGAN) 25 MG tablet Take 1 tablet (25 mg total) by mouth every 6 (six) hours as needed for nausea or vomiting. 11/25/15   Tilden Fossa, MD  sertraline (ZOLOFT) 100 MG tablet Take 200 mg by mouth daily.    Historical Provider, MD  sertraline (ZOLOFT) 50 MG tablet Take 100 mg by mouth 2 (two) times daily.     Historical Provider, MD  sitaGLIPtin (JANUVIA) 100 MG tablet Take 1 tablet (100 mg total) by mouth daily. 10/25/15   Jacquelin Hawking, PA-C   Triage  VS: BP 175/96 mmHg  Pulse 76  Temp(Src) 98.1 F (36.7 C) (Oral)  Resp 18  SpO2 96% Exam VS: BP 157/86 mmHg  Pulse 74  Temp(Src) 98.1 F (36.7 C) (Oral)  Resp 14  SpO2 98%  Physical Exam  Constitutional: He is oriented to person, place, and time. Vital signs are normal. He appears well-developed and well-nourished.  Non-toxic appearance. No distress.  Afebrile, nontoxic, NAD. Hypertensive consistent with prior readings, BP in the 150s/80s.  HENT:  Head: Normocephalic and atraumatic.  Mouth/Throat: Oropharynx is clear and moist and mucous membranes are normal.  Eyes: Conjunctivae and EOM are normal. Right eye exhibits no discharge. Left eye exhibits no discharge.  Neck: Normal range of motion. Neck supple.  Cardiovascular: Normal rate, regular rhythm, normal  heart sounds and intact distal pulses.  Exam reveals no gallop and no friction rub.   No murmur heard. RRR, nl s1/s2, no m/r/g, distal pulses intact, no pedal edema   Pulmonary/Chest: Effort normal and breath sounds normal. No respiratory distress. He has no decreased breath sounds. He has no wheezes. He has no rhonchi. He has no rales. He exhibits no tenderness, no crepitus, no deformity and no retraction.  CTAB in all lung fields, no w/r/r, no hypoxia or increased WOB, speaking in full sentences, SpO2 96-100% on RA  No chest wall TTP, crepitus, deformity, or retraction  Abdominal: Soft. Normal appearance and bowel sounds are normal. He exhibits no distension. There is no tenderness. There is no rigidity, no rebound, no guarding, no CVA tenderness, no tenderness at McBurney's point and negative Murphy's sign.  Musculoskeletal: Normal range of motion.  MAE x4 Strength and sensation grossly intact Distal pulses intact No pedal edema, neg homan's bilaterally   Neurological: He is alert and oriented to person, place, and time. He has normal strength. No sensory deficit.  Skin: Skin is warm, dry and intact. No rash noted.  Psychiatric: His mood appears anxious. He is not actively hallucinating. He expresses no homicidal and no suicidal ideation. He expresses no suicidal plans and no homicidal plans.  Anxious mood  Nursing note and vitals reviewed.   ED Course  Procedures (including critical care time) Labs Review Labs Reviewed  BASIC METABOLIC PANEL - Abnormal; Notable for the following:    CO2 21 (*)    Glucose, Bld 263 (*)    All other components within normal limits  CBC - Abnormal; Notable for the following:    MCHC 36.4 (*)    All other components within normal limits  I-STAT TROPOININ, ED    Imaging Review Dg Chest 2 View  11/25/2015  CLINICAL DATA:  Night sweats for 2 days. EXAM: CHEST  2 VIEW COMPARISON:  Single view of the chest 04/28/2012. FINDINGS: The lungs are clear. Heart  size is normal. No pneumothorax or pleural effusion. No focal bony abnormality. IMPRESSION: Negative chest. Electronically Signed   By: Drusilla Kanner M.D.   On: 11/25/2015 15:14   Ct Abdomen Pelvis W Contrast  11/25/2015  CLINICAL DATA:  Hot sweaty clammy nausea no appetite runny nose for 2 days EXAM: CT ABDOMEN AND PELVIS WITH CONTRAST TECHNIQUE: Multidetector CT imaging of the abdomen and pelvis was performed using the standard protocol following bolus administration of intravenous contrast. CONTRAST:  OMNIPAQUE IOHEXOL 300 MG/ML  SOLN COMPARISON:  11/29/2006 FINDINGS: Lower chest:  Normal Hepatobiliary: Moderate diffuse hepatic steatosis Pancreas: Normal Spleen: No significant abnormalities.  2 cm splenule noted. Adrenals/Urinary Tract: Normal Stomach/Bowel: Normal including appendix Vascular/Lymphatic: No  vascular abnormalities. No significant abdominal or pelvic adenopathy. Reproductive: Normal Other: No ascites Musculoskeletal: No acute findings. Moderate calcified disc osteophytic bulge L5-S1 causing canal narrowing. Multilevel degenerative disc disease lower thoracic spine and upper lumbar spine. IMPRESSION: No acute findings to explain the patient's symptoms. Hepatic steatosis as previously described. Electronically Signed   By: Esperanza Heir M.D.   On: 11/25/2015 18:52   I have personally reviewed and evaluated these images and lab results as part of my medical decision-making.   EKG Interpretation   Date/Time:  Monday November 26 2015 10:11:53 EST Ventricular Rate:  75 PR Interval:  126 QRS Duration: 98 QT Interval:  378 QTC Calculation: 422 R Axis:   29 Text Interpretation:  Normal sinus rhythm Nonspecific ST abnormality  Abnormal ECG Sinus rhythm ST-t wave abnormality Abnormal ekg Confirmed by  Gerhard Munch  MD [id number]) on 11/26/2015 12:10:08 PM      MDM   Final diagnoses:  Atypical chest pain  Anxiety  Hyperglycemia  Diarrhea, unspecified type  Tobacco use     36 y.o. male here with intermittent CP x3 days, states he feels anxious, has been out of his zoloft and xanax x1 month. States he feels this is the cause of his symptoms. Was seen yesterday for flu like symptoms, had neg CXR and neg trop. Today trop neg, EKG unchanged without concerning findings, BMP with hyperglycemia of 263 without anion gap, CBC unremarkable. Pt without tachycardia or hypoxia, no LE swelling, doubt PE/DVT. Doubt ACS given neg trop x2 in 24hrs. Doubt need for repeat CXR. Pt having difficulty affording meds and has no PCP, will have care management see pt to discuss options. Pt without HI/SI/AVH, doubt need for TTS consultation. Will give xanax 1mg  here and reassess shortly.   2:42 PM Pt feeling better after xanax. Care manager saw pt and stated he can f/up with sickle cell clinic and [facility] pharmacy for medication needs and ongoing PCP. Pt given resource guide. Will refill xanax but discussed that it will be at a slightly lower dose since he was taking quite a bit. Discussed BRAT diet for diarrhea. Pt feels well and ready for discharge, doubt need for ongoing labs/imaging at this time, doubt ACS given 2 neg troponins 24hrs apart and neg CXR. F/up with sickle cell clinic at already scheduled appt. I explained the diagnosis and have given explicit precautions to return to the ER including for any other new or worsening symptoms. The patient understands and accepts the medical plan as it's been dictated and I have answered their questions. Discharge instructions concerning home care and prescriptions have been given. The patient is STABLE and is discharged to home in good condition.  BP 157/86 mmHg  Pulse 74  Temp(Src) 98.1 F (36.7 C) (Oral)  Resp 14  SpO2 98%  Meds ordered this encounter  Medications  . ALPRAZolam (XANAX) tablet 1 mg    Sig:   . ALPRAZolam (XANAX) 1 MG tablet    Sig: Take 1 tablet (1 mg total) by mouth 3 (three) times daily as needed for anxiety.    Dispense:   12 tablet    Refill:  0    Order Specific Question:  Supervising Provider    Answer:  Eber Hong [[id number]]     Jessyka Austria Camprubi-Soms, PA-C 11/26/15 1444  Gerhard Munch, MD 11/27/15 [phone]

## 2015-11-26 NOTE — Care Management Note (Signed)
Case Management Note  Patient Details  Name: Edwin Ford MRN: [id number] Date of Birth: [birth year]  Subjective/Objective:                  35 y.o. male with a PMHx of DM2, HTN, GERD, and HLD, who presents to the ED with complaints of 3 days of intermittent chest tightness that occurs mostly at night.//Home alone.  Action/Plan: Follow for disposition needs.   Expected Discharge Date:       11/26/15           Expected Discharge Plan:  Home/Self Care  In-House Referral:  NA  Discharge planning Services  CM Consult, Follow-up appt scheduled  Post Acute Care Choice:  NA Choice offered to:  NA  DME Arranged:  N/A DME Agency:  NA  HH Arranged:  NA HH Agency:  NA  Status of Service:  Completed, signed off  Medicare Important Message Given:    Date Medicare IM Given:    Medicare IM give by:    Date Additional Medicare IM Given:    Additional Medicare Important Message give by:     If discussed at [place] of Stay Meetings, dates discussed:    Additional Comments: Gracieann Stannard J. Clydene Laming, RN, BSN, [place] [phone] ED CM consulted regarding PCP establishment and insurance enrollment. Pt presented to [facility] ED today with chest pain. NCM met with pt at bedside; pt confirms not having access to f/u care with PCP or insurance coverage. Discussed with patient importance and benefits of establishing PCP, and not utilizing the ED for primary care needs. Pt verbalized understanding and is in agreement. Discussed other options, provided list of local  affordable PCPs.  Pt voiced interest in the [facility] and [facility].  NCM advised that [facility]  Internal Medicine providers are seeing pts at [facility]. Pt verbalized understanding. NCM set up appointment at [facility] with Cammie Sickle, NP on 1/30 at 2:00.  NCM informed pt they may visit [place] [facility] [place] for Rx needs upon discharge.  NCM provided business card of [facility], [place]  Specialist and instructed to call with question or concerns pertaining [facility]/orange card process.  Pt verbalized understanding.  Fuller Mandril, RN 11/26/2015, 2:06 PM

## 2015-11-28 ENCOUNTER — Telehealth: Payer: Self-pay | Admitting: Physician Assistant

## 2015-11-28 NOTE — Telephone Encounter (Signed)
Pt called stating that he was having anxiety and needed help for his anxiety.  Discussed that pt had been to ER twice over the weekend and he says he got some xanax which is helping but he needs more.  [facility]  Told pt what he needed to take with him and recommended that he go now in case they get busy later in the day.  Pt states that he will go as soon as he gets off the phone.  Told pt to call again if he needs more assistance before his usual f/u

## 2015-12-17 ENCOUNTER — Ambulatory Visit: Payer: Self-pay | Admitting: Family Medicine

## 2015-12-19 ENCOUNTER — Ambulatory Visit: Payer: Self-pay | Admitting: Physician Assistant

## 2015-12-24 ENCOUNTER — Encounter: Payer: Self-pay | Admitting: Physician Assistant

## 2016-01-22 ENCOUNTER — Ambulatory Visit: Payer: Self-pay | Admitting: Physician Assistant

## 2016-01-22 ENCOUNTER — Encounter: Payer: Self-pay | Admitting: Physician Assistant

## 2016-01-22 VITALS — BP 136/84 | HR 76 | Temp 97.7°F | Ht 72.0 in | Wt 288.5 lb

## 2016-01-22 DIAGNOSIS — Z91199 Patient's noncompliance with other medical treatment and regimen due to unspecified reason: Secondary | ICD-10-CM

## 2016-01-22 DIAGNOSIS — F17210 Nicotine dependence, cigarettes, uncomplicated: Secondary | ICD-10-CM

## 2016-01-22 DIAGNOSIS — Z9119 Patient's noncompliance with other medical treatment and regimen: Secondary | ICD-10-CM

## 2016-01-22 DIAGNOSIS — I10 Essential (primary) hypertension: Secondary | ICD-10-CM

## 2016-01-22 DIAGNOSIS — F32A Depression, unspecified: Secondary | ICD-10-CM | POA: Insufficient documentation

## 2016-01-22 DIAGNOSIS — E118 Type 2 diabetes mellitus with unspecified complications: Secondary | ICD-10-CM

## 2016-01-22 DIAGNOSIS — F329 Major depressive disorder, single episode, unspecified: Secondary | ICD-10-CM

## 2016-01-22 DIAGNOSIS — E785 Hyperlipidemia, unspecified: Secondary | ICD-10-CM

## 2016-01-22 MED ORDER — SERTRALINE HCL 100 MG PO TABS: 200.0000 mg | ORAL_TABLET | Freq: Every day | ORAL | Status: DC

## 2016-01-22 MED ORDER — ATORVASTATIN CALCIUM 20 MG PO TABS: 20.0000 mg | ORAL_TABLET | Freq: Every day | ORAL | Status: DC

## 2016-01-22 NOTE — Progress Notes (Signed)
BP 136/84 mmHg  Pulse 76  Temp(Src) 97.7 F (36.5 C)  Ht 6' (1.829 m)  Wt 288 lb 8 oz (130.863 kg)  BMI 39.12 kg/m2  SpO2 97%   Subjective:    Patient ID: Edwin Ford, male    DOB: 1981-04-19, 35 y.o.   MRN: [id number]  HPI: Edwin Flackddie B Manigo is a 35 y.o. male presenting on 01/22/2016 for Follow-up   HPI   pt has appt with his psychiatrist (dr Evelene CroonKaur) in April.  Pt goes to private dr b/c he had bad experience [facility].  Discussed that he needs to be seen more often (currently is seen once every five months) and there are other options besides [facility].   Pt requests rx sertraline 200mg  qd b/c he has one tablet remaining - won't last until next scheduled psych appt  Pt has not used his insulin in about a month b/c he ran out of needles.  Counseled pt to not run out- all he needs to do is stop by the office and pick some up  Pt says he never got his lipitor in the mail  Relevant past medical, surgical, family and social history reviewed and updated as indicated. Interim medical history since our last visit reviewed. Allergies and medications reviewed and updated.   Current outpatient prescriptions:  .  albuterol (PROVENTIL HFA;VENTOLIN HFA) 108 (90 BASE) MCG/ACT inhaler, Inhale 2 puffs into the lungs every 6 (six) hours as needed for wheezing or shortness of breath., Disp: 3 Inhaler, Rfl: 1 .  amLODipine (NORVASC) 5 MG tablet, Take 1 tablet (5 mg total) by mouth daily., Disp: 90 tablet, Rfl: 3 .  glipiZIDE (GLUCOTROL) 10 MG tablet, Take 1 tablet (10 mg total) by mouth 2 (two) times daily before a meal. (Patient taking differently: Take 10 mg by mouth daily. ), Disp: 180 tablet, Rfl: 2 .  lisinopril (PRINIVIL,ZESTRIL) 20 MG tablet, Take 1 tablet (20 mg total) by mouth daily., Disp: 90 tablet, Rfl: 1 .  metFORMIN (GLUCOPHAGE) 1000 MG tablet, Take 1 tablet (1,000 mg total) by mouth 2 (two) times daily with a meal., Disp: 180 tablet, Rfl: 1 .  metoprolol (LOPRESSOR) 100 MG tablet, Take 1  tablet (100 mg total) by mouth 2 (two) times daily., Disp: 180 tablet, Rfl: 3 .  omeprazole (PRILOSEC) 20 MG capsule, Take 1 capsule (20 mg total) by mouth 2 (two) times daily., Disp: 180 capsule, Rfl: 1 .  sertraline (ZOLOFT) 100 MG tablet, Take 200 mg by mouth daily., Disp: , Rfl:  .  sitaGLIPtin (JANUVIA) 100 MG tablet, Take 1 tablet (100 mg total) by mouth daily., Disp: 90 tablet, Rfl: 1 .  ALPRAZolam (XANAX) 1 MG tablet, Take 1 tablet (1 mg total) by mouth 3 (three) times daily as needed for anxiety. (Patient not taking: Reported on 01/22/2016), Disp: 12 tablet, Rfl: 0 .  insulin glargine (LANTUS) 100 UNIT/ML injection, Inject 48 Units into the skin at bedtime. Reported on 01/22/2016, Disp: , Rfl:  .  Omega-3 Fatty Acids (FISH OIL) 1000 MG CAPS, Take by mouth. Reported on 01/22/2016, Disp: , Rfl:  .  [DISCONTINUED] atorvastatin (LIPITOR) 20 MG tablet, Take 1 tablet (20 mg total) by mouth at bedtime. (Patient not taking: Reported on 11/25/2015), Disp: 90 tablet, Rfl: 0   Review of Systems  Constitutional: Negative for fever, chills, diaphoresis, appetite change, fatigue and unexpected weight change.  HENT: Negative for congestion, dental problem, drooling, ear pain, facial swelling, hearing loss, mouth sores, sneezing, sore throat, trouble  swallowing and voice change.   Eyes: Positive for visual disturbance. Negative for pain, discharge, redness and itching.  Respiratory: Positive for wheezing. Negative for cough, choking and shortness of breath.   Cardiovascular: Positive for leg swelling. Negative for chest pain and palpitations.  Gastrointestinal: Negative for vomiting, abdominal pain, diarrhea, constipation and blood in stool.  Endocrine: Negative for cold intolerance, heat intolerance and polydipsia.  Genitourinary: Negative for dysuria, hematuria and decreased urine volume.  Musculoskeletal: Positive for arthralgias. Negative for back pain and gait problem.  Skin: Negative for rash.   Allergic/Immunologic: Positive for environmental allergies.  Neurological: Negative for seizures, syncope, light-headedness and headaches.  Hematological: Negative for adenopathy.  Psychiatric/Behavioral: Positive for dysphoric mood and agitation. Negative for suicidal ideas. The patient is nervous/anxious.     Per HPI unless specifically indicated above     Objective:    BP 136/84 mmHg  Pulse 76  Temp(Src) 97.7 F (36.5 C)  Ht 6' (1.829 m)  Wt 288 lb 8 oz (130.863 kg)  BMI 39.12 kg/m2  SpO2 97%  Wt Readings from Last 3 Encounters:  01/22/16 288 lb 8 oz (130.863 kg)  11/25/15 288 lb 4.8 oz (130.772 kg)  09/18/15 302 lb 3.2 oz (137.077 kg)    Physical Exam  Constitutional: He is oriented to person, place, and time. He appears well-developed and well-nourished.  HENT:  Head: Normocephalic and atraumatic.  Neck: Neck supple.  Cardiovascular: Normal rate and regular rhythm.   Pulmonary/Chest: Effort normal and breath sounds normal. He has no wheezes.  Abdominal: Soft. Bowel sounds are normal. There is no hepatosplenomegaly. There is no tenderness.  Musculoskeletal: He exhibits no edema.  Lymphadenopathy:    He has no cervical adenopathy.  Neurological: He is alert and oriented to person, place, and time.  Skin: Skin is warm and dry.  Psychiatric: He has a normal mood and affect. His behavior is normal.  Vitals reviewed.  Foot exam done      Assessment & Plan:   Encounter Diagnoses  Name Primary?  . Essential hypertension, benign Yes  . Type 2 diabetes mellitus with complication, unspecified long term insulin use status (HCC)   . Personal history of noncompliance with medical treatment, presenting hazards to health   . Cigarette nicotine dependence, uncomplicated   . Hyperlipidemia   . Depression     -Resend lipitor rx to [facility] -Sertraline rx until pt gets in to dr Evelene Croon -[facility]ave [facility] card for pt to call to get appointment for [facility]  pt  agrees he needs more MH than q 5 months -No labs today -Gave syringes today and counseled pt to avoid running out of them.   -counseled on smoking cessation -F/u 3 months.  RTO sooner prn

## 2016-01-22 NOTE — Patient Instructions (Signed)
Resend atorvastatin ( lipitor) prescription zoloft (Sertraline) rx until pt gets in to dr Edwin CroonKaur Gave [facility] card for Edwin Ford to call to get appointment for counseling.  pt agrees he needs more MH than every 5 months No labs Gave syringes

## 2016-04-15 ENCOUNTER — Other Ambulatory Visit: Payer: Self-pay | Admitting: Student

## 2016-04-15 ENCOUNTER — Other Ambulatory Visit: Payer: Self-pay | Admitting: Physician Assistant

## 2016-04-15 DIAGNOSIS — E785 Hyperlipidemia, unspecified: Secondary | ICD-10-CM

## 2016-04-15 DIAGNOSIS — I10 Essential (primary) hypertension: Secondary | ICD-10-CM

## 2016-04-15 DIAGNOSIS — E118 Type 2 diabetes mellitus with unspecified complications: Secondary | ICD-10-CM

## 2016-04-21 LAB — HEMOGLOBIN A1C
HEMOGLOBIN A1C: 6.3 % — AB (ref <5.7)
MEAN PLASMA GLUCOSE: 134 mg/dL

## 2016-04-22 LAB — COMPLETE METABOLIC PANEL WITH GFR
ALBUMIN: 4 g/dL (ref 3.6–5.1)
ALK PHOS: 63 U/L (ref 40–115)
ALT: 30 U/L (ref 9–46)
AST: 18 U/L (ref 10–40)
BUN: 7 mg/dL (ref 7–25)
CO2: 25 mmol/L (ref 20–31)
Calcium: 8.9 mg/dL (ref 8.6–10.3)
Chloride: 101 mmol/L (ref 98–110)
Creat: 0.8 mg/dL (ref 0.60–1.35)
GFR, Est African American: >89 mL/min (ref >=60)
GFR, Est Non African American: >89 mL/min (ref >=60)
GLUCOSE: 150 mg/dL — AB (ref 65–99)
POTASSIUM: 3.6 mmol/L (ref 3.5–5.3)
SODIUM: 140 mmol/L (ref 135–146)
Total Bilirubin: 0.4 mg/dL (ref 0.2–1.2)
Total Protein: 6.4 g/dL (ref 6.1–8.1)

## 2016-04-22 LAB — LIPID PANEL
CHOL/HDL RATIO: 7.2 ratio — AB (ref <=5.0)
Cholesterol: 151 mg/dL (ref 125–200)
HDL: 21 mg/dL — ABNORMAL LOW (ref >=40)
LDL CALC: 69 mg/dL (ref <130)
Triglycerides: 307 mg/dL — ABNORMAL HIGH (ref <150)
VLDL: 61 mg/dL — ABNORMAL HIGH (ref <30)

## 2016-04-23 ENCOUNTER — Ambulatory Visit: Payer: Self-pay | Admitting: Physician Assistant

## 2016-04-23 ENCOUNTER — Encounter: Payer: Self-pay | Admitting: Physician Assistant

## 2016-04-23 VITALS — BP 136/80 | HR 79 | Temp 97.3°F | Ht 72.0 in | Wt 291.7 lb

## 2016-04-23 DIAGNOSIS — F17210 Nicotine dependence, cigarettes, uncomplicated: Secondary | ICD-10-CM

## 2016-04-23 DIAGNOSIS — F329 Major depressive disorder, single episode, unspecified: Secondary | ICD-10-CM

## 2016-04-23 DIAGNOSIS — F32A Depression, unspecified: Secondary | ICD-10-CM

## 2016-04-23 DIAGNOSIS — I10 Essential (primary) hypertension: Secondary | ICD-10-CM

## 2016-04-23 DIAGNOSIS — E785 Hyperlipidemia, unspecified: Secondary | ICD-10-CM

## 2016-04-23 DIAGNOSIS — E118 Type 2 diabetes mellitus with unspecified complications: Secondary | ICD-10-CM

## 2016-04-23 NOTE — Progress Notes (Signed)
BP 136/80 mmHg  Pulse 79  Temp(Src) 97.3 F (36.3 C)  Ht 6' (1.829 m)  Wt 291 lb 11.2 oz (132.314 kg)  BMI 39.55 kg/m2  SpO2 97%   Subjective:    Patient ID: Edwin Ford, male    DOB: 17-Jan-1981, 35 y.o.   MRN: [id number]  HPI: AQUIL DUHE is a 35 y.o. male presenting on 04/23/2016 for Diabetes   HPI   Pt is still seeing dr Evelene Croon- he went in May. He will f/u there 08/28/16.  Pt says he is doing well.  He has not used his insulin b/c his bs has been good.    Relevant past medical, surgical, family and social history reviewed and updated as indicated. Interim medical history since our last visit reviewed. Allergies and medications reviewed and updated.  Current outpatient prescriptions:  .  albuterol (PROVENTIL HFA;VENTOLIN HFA) 108 (90 BASE) MCG/ACT inhaler, Inhale 2 puffs into the lungs every 6 (six) hours as needed for wheezing or shortness of breath., Disp: 3 Inhaler, Rfl: 1 .  ALPRAZolam (XANAX) 1 MG tablet, Take 1 tablet (1 mg total) by mouth 3 (three) times daily as needed for anxiety., Disp: 12 tablet, Rfl: 0 .  amLODipine (NORVASC) 5 MG tablet, Take 1 tablet (5 mg total) by mouth daily., Disp: 90 tablet, Rfl: 3 .  atorvastatin (LIPITOR) 20 MG tablet, Take 1 tablet (20 mg total) by mouth daily., Disp: 90 tablet, Rfl: 3 .  glipiZIDE (GLUCOTROL) 10 MG tablet, Take 1 tablet (10 mg total) by mouth 2 (two) times daily before a meal., Disp: 180 tablet, Rfl: 2 .  lisinopril (PRINIVIL,ZESTRIL) 20 MG tablet, TAKE 1 Tablet BY MOUTH ONCE DAILY, Disp: 90 tablet, Rfl: 1 .  metFORMIN (GLUCOPHAGE) 1000 MG tablet, TAKE 1 Tablet  BY MOUTH TWICE DAILY WITH MEALS, Disp: 180 tablet, Rfl: 1 .  metoprolol (LOPRESSOR) 100 MG tablet, Take 1 tablet (100 mg total) by mouth 2 (two) times daily., Disp: 180 tablet, Rfl: 3 .  omeprazole (PRILOSEC) 20 MG capsule, Take 1 capsule (20 mg total) by mouth 2 (two) times daily., Disp: 180 capsule, Rfl: 1 .  sertraline (ZOLOFT) 100 MG tablet, Take 2 tablets  (200 mg total) by mouth daily., Disp: 60 tablet, Rfl: 1 .  sitaGLIPtin (JANUVIA) 100 MG tablet, Take 1 tablet (100 mg total) by mouth daily., Disp: 90 tablet, Rfl: 1 .  insulin glargine (LANTUS) 100 UNIT/ML injection, Inject 48 Units into the skin at bedtime. Reported on 04/23/2016, Disp: , Rfl:  .  Omega-3 Fatty Acids (FISH OIL) 1000 MG CAPS, Take by mouth. Reported on 04/23/2016, Disp: , Rfl:    Review of Systems  Constitutional: Negative for fever, chills, diaphoresis, appetite change, fatigue and unexpected weight change.  HENT: Positive for congestion and dental problem. Negative for drooling, ear pain, facial swelling, hearing loss, mouth sores, sneezing, sore throat, trouble swallowing and voice change.   Eyes: Negative for pain, discharge, redness and itching.  Respiratory: Positive for wheezing. Negative for cough, choking and shortness of breath.   Cardiovascular: Positive for leg swelling. Negative for chest pain and palpitations.  Gastrointestinal: Negative for vomiting, abdominal pain, diarrhea, constipation and blood in stool.  Endocrine: Negative for cold intolerance, heat intolerance and polydipsia.  Genitourinary: Negative for dysuria, hematuria and decreased urine volume.  Musculoskeletal: Positive for back pain and arthralgias. Negative for gait problem.  Skin: Negative for rash.  Allergic/Immunologic: Positive for environmental allergies.  Neurological: Negative for seizures, syncope, light-headedness and headaches.  Hematological: Negative for adenopathy.  Psychiatric/Behavioral: Positive for dysphoric mood and agitation. Negative for suicidal ideas. The patient is nervous/anxious.     Per HPI unless specifically indicated above     Objective:    BP 136/80 mmHg  Pulse 79  Temp(Src) 97.3 F (36.3 C)  Ht 6' (1.829 m)  Wt 291 lb 11.2 oz (132.314 kg)  BMI 39.55 kg/m2  SpO2 97%  Wt Readings from Last 3 Encounters:  04/23/16 291 lb 11.2 oz (132.314 kg)  01/22/16 288  lb 8 oz (130.863 kg)  11/25/15 288 lb 4.8 oz (130.772 kg)    Physical Exam  Constitutional: He is oriented to person, place, and time. He appears well-developed and well-nourished.  HENT:  Head: Normocephalic and atraumatic.  Neck: Neck supple.  Cardiovascular: Normal rate and regular rhythm.   Pulmonary/Chest: Effort normal and breath sounds normal. He has no wheezes.  Abdominal: Soft. Bowel sounds are normal. There is no hepatosplenomegaly. There is no tenderness.  Musculoskeletal: He exhibits no edema.  Lymphadenopathy:    He has no cervical adenopathy.  Neurological: He is alert and oriented to person, place, and time.  Skin: Skin is warm and dry.  Psychiatric: He has a normal mood and affect. His behavior is normal.  Vitals reviewed.   Results for orders placed or performed in visit on 04/15/16  Lipid Profile  Result Value Ref Range   Cholesterol 151 125 - 200 mg/dL   Triglycerides 829307 (H) <150 mg/dL   HDL 21 (L) >=56>=40 mg/dL   Total CHOL/HDL Ratio 7.2 (H) <=5.0 Ratio   VLDL 61 (H) <30 mg/dL   LDL Cholesterol 69 <213<130 mg/dL  COMPLETE METABOLIC PANEL WITH GFR  Result Value Ref Range   Sodium 140 135 - 146 mmol/L   Potassium 3.6 3.5 - 5.3 mmol/L   Chloride 101 98 - 110 mmol/L   CO2 25 20 - 31 mmol/L   Glucose, Bld 150 (H) 65 - 99 mg/dL   BUN 7 7 - 25 mg/dL   Creat 0.860.80 5.780.60 - 4.691.35 mg/dL   Total Bilirubin 0.4 0.2 - 1.2 mg/dL   Alkaline Phosphatase 63 40 - 115 U/L   AST 18 10 - 40 U/L   ALT 30 9 - 46 U/L   Total Protein 6.4 6.1 - 8.1 g/dL   Albumin 4.0 3.6 - 5.1 g/dL   Calcium 8.9 8.6 - 62.910.3 mg/dL   GFR, Est African American >89 >=60 mL/min   GFR, Est Non African American >89 >=60 mL/min  HgB A1c  Result Value Ref Range   Hgb A1c MFr Bld 6.3 (H) <5.7 %   Mean Plasma Glucose 134 mg/dL      Assessment & Plan:   Encounter Diagnoses  Name Primary?  . Type 2 diabetes mellitus with complication, unspecified long term insulin use status (HCC) Yes  . Essential  hypertension, benign   . Hyperlipidemia   . Cigarette nicotine dependence, uncomplicated   . Morbid obesity, unspecified obesity type (HCC)   . Depression     -reviewed labs with pt -will stay off insulin since a1c is good without it -Contine other current medications -counseled on smoking cessation -F/u 4 months. RTO sooner prn

## 2016-04-25 ENCOUNTER — Other Ambulatory Visit: Payer: Self-pay | Admitting: Physician Assistant

## 2016-05-27 ENCOUNTER — Other Ambulatory Visit: Payer: Self-pay | Admitting: Physician Assistant

## 2016-07-24 ENCOUNTER — Other Ambulatory Visit: Payer: Self-pay | Admitting: Physician Assistant

## 2016-08-18 ENCOUNTER — Other Ambulatory Visit: Payer: Self-pay | Admitting: Physician Assistant

## 2016-08-18 DIAGNOSIS — I10 Essential (primary) hypertension: Secondary | ICD-10-CM

## 2016-08-18 DIAGNOSIS — E118 Type 2 diabetes mellitus with unspecified complications: Secondary | ICD-10-CM

## 2016-08-18 DIAGNOSIS — E785 Hyperlipidemia, unspecified: Secondary | ICD-10-CM

## 2016-08-18 LAB — COMPLETE METABOLIC PANEL WITH GFR
ALBUMIN: 4 g/dL (ref 3.6–5.1)
ALK PHOS: 65 U/L (ref 40–115)
ALT: 35 U/L (ref 9–46)
AST: 22 U/L (ref 10–40)
BUN: 7 mg/dL (ref 7–25)
CALCIUM: 9.3 mg/dL (ref 8.6–10.3)
CO2: 22 mmol/L (ref 20–31)
Chloride: 102 mmol/L (ref 98–110)
Creat: 0.8 mg/dL (ref 0.60–1.35)
Glucose, Bld: 119 mg/dL — ABNORMAL HIGH (ref 65–99)
POTASSIUM: 3.8 mmol/L (ref 3.5–5.3)
Sodium: 138 mmol/L (ref 135–146)
Total Bilirubin: 0.5 mg/dL (ref 0.2–1.2)
Total Protein: 6.5 g/dL (ref 6.1–8.1)

## 2016-08-18 LAB — LIPID PANEL
CHOL/HDL RATIO: 7.4 ratio — AB (ref <=5.0)
CHOLESTEROL: 148 mg/dL (ref 125–200)
HDL: 20 mg/dL — AB (ref >=40)
LDL Cholesterol: 84 mg/dL (ref <130)
TRIGLYCERIDES: 222 mg/dL — AB (ref <150)
VLDL: 44 mg/dL — ABNORMAL HIGH (ref <30)

## 2016-08-19 LAB — HEMOGLOBIN A1C
HEMOGLOBIN A1C: 7.4 % — AB (ref <5.7)
Mean Plasma Glucose: 166 mg/dL

## 2016-08-19 LAB — MICROALBUMIN, URINE: Microalb, Ur: 1.9 mg/dL

## 2016-08-21 ENCOUNTER — Other Ambulatory Visit: Payer: Self-pay | Admitting: Physician Assistant

## 2016-08-21 ENCOUNTER — Ambulatory Visit: Payer: Self-pay | Admitting: Physician Assistant

## 2016-08-21 ENCOUNTER — Encounter: Payer: Self-pay | Admitting: Physician Assistant

## 2016-08-21 VITALS — BP 130/84 | HR 76 | Temp 97.7°F | Ht 72.0 in | Wt 301.0 lb

## 2016-08-21 DIAGNOSIS — F32A Depression, unspecified: Secondary | ICD-10-CM

## 2016-08-21 DIAGNOSIS — I10 Essential (primary) hypertension: Secondary | ICD-10-CM

## 2016-08-21 DIAGNOSIS — IMO0001 Reserved for inherently not codable concepts without codable children: Secondary | ICD-10-CM

## 2016-08-21 DIAGNOSIS — E785 Hyperlipidemia, unspecified: Secondary | ICD-10-CM

## 2016-08-21 DIAGNOSIS — F17219 Nicotine dependence, cigarettes, with unspecified nicotine-induced disorders: Secondary | ICD-10-CM

## 2016-08-21 DIAGNOSIS — F329 Major depressive disorder, single episode, unspecified: Secondary | ICD-10-CM

## 2016-08-21 DIAGNOSIS — E1165 Type 2 diabetes mellitus with hyperglycemia: Principal | ICD-10-CM

## 2016-08-21 NOTE — Progress Notes (Signed)
BP 130/84   Pulse 76   Temp 97.7 F (36.5 C)   Ht 6' (1.829 m)   Wt (!) 301 lb (136.5 kg)   SpO2 98%   BMI 40.82 kg/m    Subjective:    Patient ID: Edwin Ford, male    DOB: 09-Jan-1981, 35 y.o.   MRN: [id number]  HPI: Edwin Ford is a 35 y.o. male presenting on 08/21/2016 for Diabetes; Hypertension; and Hyperlipidemia   HPI   Pt is still seeing dr Evelene Croon in [facility] for [facility] issues  Pt not using insulin.  States sugars have been good- none over 200.    Pt states he has been doing well but allergies have been bothering him lately.  He is still smoking.    Relevant past medical, surgical, family and social history reviewed and updated as indicated. Interim medical history since our last visit reviewed. Allergies and medications reviewed and updated.   Current Outpatient Prescriptions:  .  albuterol (PROVENTIL HFA;VENTOLIN HFA) 108 (90 BASE) MCG/ACT inhaler, Inhale 2 puffs into the lungs every 6 (six) hours as needed for wheezing or shortness of breath., Disp: 3 Inhaler, Rfl: 1 .  ALPRAZolam (XANAX) 1 MG tablet, Take 1 tablet (1 mg total) by mouth 3 (three) times daily as needed for anxiety., Disp: 12 tablet, Rfl: 0 .  amLODipine (NORVASC) 5 MG tablet, Take 1 tablet (5 mg total) by mouth daily., Disp: 90 tablet, Rfl: 3 .  atorvastatin (LIPITOR) 20 MG tablet, Take 1 tablet (20 mg total) by mouth daily., Disp: 90 tablet, Rfl: 3 .  glipiZIDE (GLUCOTROL) 10 MG tablet, Take 1 tablet (10 mg total) by mouth 2 (two) times daily before a meal., Disp: 180 tablet, Rfl: 2 .  JANUVIA 100 MG tablet, TAKE 1 Tablet BY MOUTH ONCE DAILY, Disp: 90 tablet, Rfl: 1 .  lisinopril (PRINIVIL,ZESTRIL) 20 MG tablet, TAKE 1 Tablet BY MOUTH ONCE DAILY, Disp: 90 tablet, Rfl: 1 .  metFORMIN (GLUCOPHAGE) 1000 MG tablet, TAKE 1 Tablet  BY MOUTH TWICE DAILY WITH MEALS, Disp: 180 tablet, Rfl: 1 .  metoprolol (LOPRESSOR) 100 MG tablet, Take 1 tablet (100 mg total) by mouth 2 (two) times daily., Disp: 180  tablet, Rfl: 3 .  omeprazole (PRILOSEC) 20 MG capsule, TAKE 1 Capsule  BY MOUTH TWICE DAILY, Disp: 180 capsule, Rfl: 2 .  sertraline (ZOLOFT) 100 MG tablet, Take 2 tablets (200 mg total) by mouth daily., Disp: 60 tablet, Rfl: 1 .  insulin glargine (LANTUS) 100 UNIT/ML injection, Inject 48 Units into the skin at bedtime. Reported on 04/23/2016, Disp: , Rfl:  .  Omega-3 Fatty Acids (FISH OIL) 1000 MG CAPS, Take by mouth. Reported on 04/23/2016, Disp: , Rfl:    Review of Systems  Constitutional: Negative for appetite change, chills, diaphoresis, fatigue, fever and unexpected weight change.  HENT: Positive for congestion. Negative for dental problem, drooling, ear pain, facial swelling, hearing loss, mouth sores, sneezing, sore throat, trouble swallowing and voice change.   Eyes: Positive for visual disturbance (needs glasses/distance blurry). Negative for pain, discharge, redness and itching.  Respiratory: Positive for wheezing. Negative for cough, choking and shortness of breath.   Cardiovascular: Negative for chest pain, palpitations and leg swelling.  Gastrointestinal: Negative for abdominal pain, blood in stool, constipation, diarrhea and vomiting.  Endocrine: Negative for cold intolerance, heat intolerance and polydipsia.  Genitourinary: Negative for decreased urine volume, dysuria and hematuria.  Musculoskeletal: Positive for arthralgias. Negative for back pain and gait problem.  Skin: Negative  for rash.  Allergic/Immunologic: Positive for environmental allergies.  Neurological: Negative for seizures, syncope, light-headedness and headaches.  Hematological: Negative for adenopathy.  Psychiatric/Behavioral: Positive for agitation and dysphoric mood. Negative for suicidal ideas. The patient is nervous/anxious.     Per HPI unless specifically indicated above     Objective:    BP 130/84   Pulse 76   Temp 97.7 F (36.5 C)   Ht 6' (1.829 m)   Wt (!) 301 lb (136.5 kg)   SpO2 98%   BMI  40.82 kg/m   Wt Readings from Last 3 Encounters:  08/21/16 (!) 301 lb (136.5 kg)  04/23/16 291 lb 11.2 oz (132.3 kg)  01/22/16 288 lb 8 oz (130.9 kg)    Physical Exam  Constitutional: He is oriented to person, place, and time. He appears well-developed and well-nourished.  HENT:  Head: Normocephalic and atraumatic.  Neck: Neck supple.  Cardiovascular: Normal rate and regular rhythm.   Pulmonary/Chest: Effort normal and breath sounds normal. He has no wheezes.  Abdominal: Soft. Bowel sounds are normal. There is no hepatosplenomegaly. There is no tenderness.  Musculoskeletal: He exhibits no edema.  Lymphadenopathy:    He has no cervical adenopathy.  Neurological: He is alert and oriented to person, place, and time.  Skin: Skin is warm and dry.  Psychiatric: He has a normal mood and affect. His behavior is normal.  Vitals reviewed.   Results for orders placed or performed in visit on 08/18/16  Lipid Profile  Result Value Ref Range   Cholesterol 148 125 - 200 mg/dL   Triglycerides 161222 (H) <150 mg/dL   HDL 20 (L) >=09>=40 mg/dL   Total CHOL/HDL Ratio 7.4 (H) <=5.0 Ratio   VLDL 44 (H) <30 mg/dL   LDL Cholesterol 84 <604<130 mg/dL  COMPLETE METABOLIC PANEL WITH GFR  Result Value Ref Range   Sodium 138 135 - 146 mmol/L   Potassium 3.8 3.5 - 5.3 mmol/L   Chloride 102 98 - 110 mmol/L   CO2 22 20 - 31 mmol/L   Glucose, Bld 119 (H) 65 - 99 mg/dL   BUN 7 7 - 25 mg/dL   Creat 5.400.80 9.810.60 - 1.911.35 mg/dL   Total Bilirubin 0.5 0.2 - 1.2 mg/dL   Alkaline Phosphatase 65 40 - 115 U/L   AST 22 10 - 40 U/L   ALT 35 9 - 46 U/L   Total Protein 6.5 6.1 - 8.1 g/dL   Albumin 4.0 3.6 - 5.1 g/dL   Calcium 9.3 8.6 - 47.810.3 mg/dL   GFR, Est African American >89 >=60 mL/min   GFR, Est Non African American >89 >=60 mL/min  HgB A1c  Result Value Ref Range   Hgb A1c MFr Bld 7.4 (H) <5.7 %   Mean Plasma Glucose 166 mg/dL  Microalbumin, urine  Result Value Ref Range   Microalb, Ur 1.9 Not estab mg/dL       Assessment & Plan:   Encounter Diagnoses  Name Primary?  Marland Kitchen. Uncontrolled diabetes mellitus type 2 without complications, unspecified long term insulin use status (HCC) Yes  . Essential hypertension, benign   . Hyperlipidemia, unspecified hyperlipidemia type   . Cigarette nicotine dependence with nicotine-induced disorder   . Morbid obesity (HCC)   . Depression, unspecified depression type     -reviewed labs with pt -pt encouraged to Get back on fish oil for triglycerides and watch lowfat diet -Pt needs to watch diabetic diet. Discussed and he says he knows where he needs to improve -  counseled smoking cessation -continue with dr Evelene Croon for [facility] issues -F/u 3 months. RTO sooner prn

## 2016-09-16 ENCOUNTER — Other Ambulatory Visit: Payer: Self-pay | Admitting: Physician Assistant

## 2016-09-16 MED ORDER — AMLODIPINE BESYLATE 5 MG PO TABS: 5.0000 mg | ORAL_TABLET | Freq: Every day | ORAL | 3 refills | Status: DC

## 2016-09-16 MED ORDER — GLIPIZIDE 10 MG PO TABS: 10.0000 mg | ORAL_TABLET | Freq: Two times a day (BID) | ORAL | 2 refills | Status: DC

## 2016-10-06 ENCOUNTER — Other Ambulatory Visit: Payer: Self-pay | Admitting: Physician Assistant

## 2016-10-06 MED ORDER — LISINOPRIL 20 MG PO TABS: 20.0000 mg | ORAL_TABLET | Freq: Every day | ORAL | 1 refills | Status: DC

## 2016-10-16 ENCOUNTER — Other Ambulatory Visit: Payer: Self-pay | Admitting: Physician Assistant

## 2016-10-16 MED ORDER — LISINOPRIL 20 MG PO TABS: 20.0000 mg | ORAL_TABLET | Freq: Every day | ORAL | 1 refills | Status: DC

## 2016-10-16 MED ORDER — METOPROLOL TARTRATE 100 MG PO TABS: 100.0000 mg | ORAL_TABLET | Freq: Two times a day (BID) | ORAL | 3 refills | Status: DC

## 2016-10-16 MED ORDER — METFORMIN HCL 1000 MG PO TABS: 1000.0000 mg | ORAL_TABLET | Freq: Two times a day (BID) | ORAL | 1 refills | Status: DC

## 2016-10-16 MED ORDER — SITAGLIPTIN PHOSPHATE 100 MG PO TABS: 100.0000 mg | ORAL_TABLET | Freq: Every day | ORAL | 1 refills | Status: DC

## 2016-11-19 ENCOUNTER — Other Ambulatory Visit: Payer: Self-pay | Admitting: Student

## 2016-11-19 DIAGNOSIS — IMO0001 Reserved for inherently not codable concepts without codable children: Secondary | ICD-10-CM

## 2016-11-19 DIAGNOSIS — E785 Hyperlipidemia, unspecified: Secondary | ICD-10-CM

## 2016-11-19 DIAGNOSIS — I10 Essential (primary) hypertension: Secondary | ICD-10-CM

## 2016-11-19 DIAGNOSIS — E1165 Type 2 diabetes mellitus with hyperglycemia: Principal | ICD-10-CM

## 2016-11-24 ENCOUNTER — Ambulatory Visit: Payer: Self-pay | Admitting: Physician Assistant

## 2016-11-24 ENCOUNTER — Encounter: Payer: Self-pay | Admitting: Physician Assistant

## 2016-11-24 VITALS — BP 144/88 | HR 74 | Temp 97.7°F | Ht 72.0 in | Wt 303.5 lb

## 2016-11-24 DIAGNOSIS — IMO0001 Reserved for inherently not codable concepts without codable children: Secondary | ICD-10-CM

## 2016-11-24 DIAGNOSIS — E1165 Type 2 diabetes mellitus with hyperglycemia: Principal | ICD-10-CM

## 2016-11-24 DIAGNOSIS — I10 Essential (primary) hypertension: Secondary | ICD-10-CM

## 2016-11-24 DIAGNOSIS — F17219 Nicotine dependence, cigarettes, with unspecified nicotine-induced disorders: Secondary | ICD-10-CM

## 2016-11-24 DIAGNOSIS — E785 Hyperlipidemia, unspecified: Secondary | ICD-10-CM

## 2016-11-24 DIAGNOSIS — K219 Gastro-esophageal reflux disease without esophagitis: Secondary | ICD-10-CM

## 2016-11-24 DIAGNOSIS — F32A Depression, unspecified: Secondary | ICD-10-CM

## 2016-11-24 DIAGNOSIS — F329 Major depressive disorder, single episode, unspecified: Secondary | ICD-10-CM

## 2016-11-24 LAB — COMPREHENSIVE METABOLIC PANEL
ALK PHOS: 56 U/L (ref 40–115)
ALT: 56 U/L — AB (ref 9–46)
AST: 28 U/L (ref 10–40)
Albumin: 3.8 g/dL (ref 3.6–5.1)
BUN: 9 mg/dL (ref 7–25)
CO2: 26 mmol/L (ref 20–31)
CREATININE: 0.67 mg/dL (ref 0.60–1.35)
Calcium: 8.6 mg/dL (ref 8.6–10.3)
Chloride: 102 mmol/L (ref 98–110)
Glucose, Bld: 174 mg/dL — ABNORMAL HIGH (ref 65–99)
Potassium: 3.8 mmol/L (ref 3.5–5.3)
SODIUM: 138 mmol/L (ref 135–146)
TOTAL PROTEIN: 6.8 g/dL (ref 6.1–8.1)
Total Bilirubin: 0.4 mg/dL (ref 0.2–1.2)

## 2016-11-24 LAB — HEMOGLOBIN A1C
Hgb A1c MFr Bld: 7.8 % — ABNORMAL HIGH (ref <5.7)
MEAN PLASMA GLUCOSE: 177 mg/dL

## 2016-11-24 LAB — LIPID PANEL
CHOLESTEROL: 149 mg/dL (ref <200)
HDL: 20 mg/dL — ABNORMAL LOW (ref >40)
LDL Cholesterol: 72 mg/dL (ref <100)
Total CHOL/HDL Ratio: 7.5 Ratio — ABNORMAL HIGH (ref <5.0)
Triglycerides: 285 mg/dL — ABNORMAL HIGH (ref <150)
VLDL: 57 mg/dL — AB (ref <30)

## 2016-11-24 NOTE — Progress Notes (Signed)
BP (!) 144/88 (BP Location: Left Arm, Patient Position: Sitting, Cuff Size: Normal)   Pulse 74   Temp 97.7 F (36.5 C)   Ht 6' (1.829 m)   Wt (!) 303 lb 8 oz (137.7 kg)   SpO2 97%   BMI 41.16 kg/m    Subjective:    Patient ID: Edwin Ford, male    DOB: 1981/02/08, 36 y.o.   MRN: [id number]  HPI: Edwin Ford is a 36 y.o. male presenting on 11/24/2016 for Diabetes; Hypertension; and Hyperlipidemia   HPI  Pt just got his blood drawn this morning.    Pt hasn't been taking his insulin Ford/c he hasn't needed it.  He hasn't been taking the fish oil Ford/c he doesn't have the money for it.   Pt is still [facility].  Pt feels like he needs to get new provider Ford/c he thinks he needs more frequent visits.  He only sees dr Evelene CroonKaur twice/year due to it's expensive.  Pt a bit nervous about going somewhere else Ford/c he doesn't want to be off his xanax.   Relevant past medical, surgical, family and social history reviewed and updated as indicated. Interim medical history since our last visit reviewed. Allergies and medications reviewed and updated.   Current Outpatient Prescriptions:  .  albuterol (PROVENTIL HFA;VENTOLIN HFA) 108 (90 BASE) MCG/ACT inhaler, Inhale 2 puffs into the lungs every 6 (six) hours as needed for wheezing or shortness of breath., Disp: 3 Inhaler, Rfl: 1 .  ALPRAZolam (XANAX) 1 MG tablet, Take 1 tablet (1 mg total) by mouth 3 (three) times daily as needed for anxiety., Disp: 12 tablet, Rfl: 0 .  amLODipine (NORVASC) 5 MG tablet, Take 1 tablet (5 mg total) by mouth daily., Disp: 90 tablet, Rfl: 3 .  atorvastatin (LIPITOR) 20 MG tablet, Take 1 tablet (20 mg total) by mouth daily., Disp: 90 tablet, Rfl: 3 .  glipiZIDE (GLUCOTROL) 10 MG tablet, Take 1 tablet (10 mg total) by mouth 2 (two) times daily before a meal., Disp: 180 tablet, Rfl: 2 .  lisinopril (PRINIVIL,ZESTRIL) 20 MG tablet, Take 1 tablet (20 mg total) by mouth daily., Disp: 90 tablet, Rfl: 1 .   metFORMIN (GLUCOPHAGE) 1000 MG tablet, Take 1 tablet (1,000 mg total) by mouth 2 (two) times daily with a meal., Disp: 180 tablet, Rfl: 1 .  metoprolol (LOPRESSOR) 100 MG tablet, Take 1 tablet (100 mg total) by mouth 2 (two) times daily., Disp: 180 tablet, Rfl: 3 .  omeprazole (PRILOSEC) 20 MG capsule, TAKE 1 Capsule  BY MOUTH TWICE DAILY, Disp: 180 capsule, Rfl: 2 .  sertraline (ZOLOFT) 100 MG tablet, Take 2 tablets (200 mg total) by mouth daily., Disp: 60 tablet, Rfl: 1 .  sitaGLIPtin (JANUVIA) 100 MG tablet, Take 1 tablet (100 mg total) by mouth daily., Disp: 90 tablet, Rfl: 1 .  insulin glargine (LANTUS) 100 UNIT/ML injection, Inject 48 Units into the skin at bedtime. Reported on 04/23/2016, Disp: , Rfl:  .  Omega-3 Fatty Acids (FISH OIL) 1000 MG CAPS, Take by mouth. Reported on 04/23/2016, Disp: , Rfl:    Review of Systems  Constitutional: Negative for appetite change, chills, diaphoresis, fatigue, fever and unexpected weight change.  HENT: Negative for congestion, dental problem, drooling, ear pain, facial swelling, hearing loss, mouth sores, sneezing, sore throat, trouble swallowing and voice change.   Eyes: Negative for pain, discharge, redness, itching and visual disturbance.  Respiratory: Negative for cough, choking, shortness of breath  and wheezing.   Cardiovascular: Negative for chest pain, palpitations and leg swelling.  Gastrointestinal: Negative for abdominal pain, blood in stool, constipation, diarrhea and vomiting.  Endocrine: Negative for cold intolerance, heat intolerance and polydipsia.  Genitourinary: Negative for decreased urine volume, dysuria and hematuria.  Musculoskeletal: Positive for arthralgias. Negative for back pain and gait problem.  Skin: Negative for rash.  Allergic/Immunologic: Negative for environmental allergies.  Neurological: Negative for seizures, syncope, light-headedness and headaches.  Hematological: Negative for adenopathy.  Psychiatric/Behavioral:  Positive for dysphoric mood. Negative for agitation and suicidal ideas. The patient is nervous/anxious.     Per HPI unless specifically indicated above     Objective:    BP (!) 144/88 (BP Location: Left Arm, Patient Position: Sitting, Cuff Size: Normal)   Pulse 74   Temp 97.7 F (36.5 C)   Ht 6' (1.829 m)   Wt (!) 303 lb 8 oz (137.7 kg)   SpO2 97%   BMI 41.16 kg/m   Wt Readings from Last 3 Encounters:  11/24/16 (!) 303 lb 8 oz (137.7 kg)  08/21/16 (!) 301 lb (136.5 kg)  04/23/16 291 lb 11.2 oz (132.3 kg)    Physical Exam  Constitutional: He is oriented to person, place, and time. He appears well-developed and well-nourished.  HENT:  Head: Normocephalic and atraumatic.  Neck: Neck supple.  Cardiovascular: Normal rate and regular rhythm.   Pulmonary/Chest: Effort normal and breath sounds normal. He has no wheezes.  Abdominal: Soft. Bowel sounds are normal. There is no hepatosplenomegaly. There is no tenderness.  Musculoskeletal: He exhibits no edema.  Lymphadenopathy:    He has no cervical adenopathy.  Neurological: He is alert and oriented to person, place, and time.  Skin: Skin is warm and dry.  Psychiatric: He has a normal mood and affect. His behavior is normal.  Vitals reviewed.       Assessment & Plan:   Encounter Diagnoses  Name Primary?  Marland Kitchen Uncontrolled diabetes mellitus type 2 without complications, unspecified long term insulin use status (HCC) Yes  . Essential hypertension, benign   . Hyperlipidemia, unspecified hyperlipidemia type   . Cigarette nicotine dependence with nicotine-induced disorder   . Morbid obesity (HCC)   . Depression, unspecified depression type   . Gastroesophageal reflux disease, esophagitis presence not specified     -Gave pt card to call [facility] for [facility] referral.  -Will call with lab results -no changes to medications today -Follow up 1 month to recheck bp

## 2016-12-17 ENCOUNTER — Other Ambulatory Visit: Payer: Self-pay | Admitting: Physician Assistant

## 2016-12-23 ENCOUNTER — Encounter: Payer: Self-pay | Admitting: Physician Assistant

## 2016-12-23 ENCOUNTER — Ambulatory Visit: Payer: Self-pay | Admitting: Physician Assistant

## 2016-12-23 VITALS — BP 134/86 | HR 76 | Ht 72.0 in | Wt 302.0 lb

## 2016-12-23 DIAGNOSIS — F329 Major depressive disorder, single episode, unspecified: Secondary | ICD-10-CM

## 2016-12-23 DIAGNOSIS — IMO0001 Reserved for inherently not codable concepts without codable children: Secondary | ICD-10-CM

## 2016-12-23 DIAGNOSIS — E1165 Type 2 diabetes mellitus with hyperglycemia: Principal | ICD-10-CM

## 2016-12-23 DIAGNOSIS — F32A Depression, unspecified: Secondary | ICD-10-CM

## 2016-12-23 NOTE — Patient Instructions (Addendum)
Stop glipizide Increase lantus to 20 units nightly Call office for fasting bs < 70 or > 300

## 2016-12-23 NOTE — Progress Notes (Signed)
BP 134/86 (BP Location: Left Arm, Patient Position: Sitting, Cuff Size: Large)   Pulse 76   Ht 6' (1.829 m)   Wt (!) 302 lb (137 kg)   SpO2 95%   BMI 40.96 kg/m    Subjective:    Patient ID: Edwin Ford, male    DOB: 20-Sep-1981, 36 y.o.   MRN: [id number]  HPI: Edwin Ford is a 36 y.o. male presenting on 12/23/2016 for Hypertension   HPI   Pt did not contact [facility] for new MH provider.  Pt doing better b/c his girlfriend is working now so they aren't together 24/7.    He has been checking his insulin.  He says it is Running in the 200s.  States no higher than 250.  Once it was 190.   Pt did not bring bs log sheet.  Relevant past medical, surgical, family and social history reviewed and updated as indicated. Interim medical history since our last visit reviewed. Allergies and medications reviewed and updated.   Current Outpatient Prescriptions:  .  albuterol (PROVENTIL HFA;VENTOLIN HFA) 108 (90 BASE) MCG/ACT inhaler, Inhale 2 puffs into the lungs every 6 (six) hours as needed for wheezing or shortness of breath., Disp: 3 Inhaler, Rfl: 1 .  ALPRAZolam (XANAX) 1 MG tablet, Take 1 tablet (1 mg total) by mouth 3 (three) times daily as needed for anxiety., Disp: 12 tablet, Rfl: 0 .  amLODipine (NORVASC) 5 MG tablet, Take 1 tablet (5 mg total) by mouth daily., Disp: 90 tablet, Rfl: 3 .  atorvastatin (LIPITOR) 20 MG tablet, TAKE 1 Tablet BY MOUTH ONCE DAILY, Disp: 90 tablet, Rfl: 2 .  glipiZIDE (GLUCOTROL) 10 MG tablet, Take 1 tablet (10 mg total) by mouth 2 (two) times daily before a meal., Disp: 180 tablet, Rfl: 2 .  insulin glargine (LANTUS) 100 UNIT/ML injection, Inject 5 Units into the skin at bedtime. Reported on 04/23/2016, Disp: , Rfl:  .  lisinopril (PRINIVIL,ZESTRIL) 20 MG tablet, Take 1 tablet (20 mg total) by mouth daily., Disp: 90 tablet, Rfl: 1 .  metFORMIN (GLUCOPHAGE) 1000 MG tablet, Take 1 tablet (1,000 mg total) by mouth 2 (two) times daily with a meal., Disp: 180  tablet, Rfl: 1 .  metoprolol (LOPRESSOR) 100 MG tablet, Take 1 tablet (100 mg total) by mouth 2 (two) times daily., Disp: 180 tablet, Rfl: 3 .  omeprazole (PRILOSEC) 20 MG capsule, TAKE 1 Capsule  BY MOUTH TWICE DAILY, Disp: 180 capsule, Rfl: 2 .  sertraline (ZOLOFT) 100 MG tablet, Take 2 tablets (200 mg total) by mouth daily., Disp: 60 tablet, Rfl: 1 .  sitaGLIPtin (JANUVIA) 100 MG tablet, Take 1 tablet (100 mg total) by mouth daily., Disp: 90 tablet, Rfl: 1 .  Omega-3 Fatty Acids (FISH OIL) 1000 MG CAPS, Take by mouth. Reported on 04/23/2016, Disp: , Rfl:   Review of Systems  Constitutional: Negative for appetite change, chills, diaphoresis, fatigue, fever and unexpected weight change.  HENT: Negative for congestion, dental problem, drooling, ear pain, facial swelling, hearing loss, mouth sores, sneezing, sore throat, trouble swallowing and voice change.   Eyes: Positive for visual disturbance. Negative for pain, discharge, redness and itching.  Respiratory: Negative for cough, choking, shortness of breath and wheezing.   Cardiovascular: Negative for chest pain, palpitations and leg swelling.  Gastrointestinal: Negative for abdominal pain, blood in stool, constipation, diarrhea and vomiting.  Endocrine: Negative for cold intolerance, heat intolerance and polydipsia.  Genitourinary: Negative for decreased urine volume, dysuria and hematuria.  Musculoskeletal:  Positive for arthralgias. Negative for back pain and gait problem.  Skin: Negative for rash.  Allergic/Immunologic: Negative for environmental allergies.  Neurological: Negative for seizures, syncope, light-headedness and headaches.  Hematological: Negative for adenopathy.  Psychiatric/Behavioral: Negative for agitation, dysphoric mood and suicidal ideas. The patient is not nervous/anxious.     Per HPI unless specifically indicated above     Objective:    BP 134/86 (BP Location: Left Arm, Patient Position: Sitting, Cuff Size: Large)    Pulse 76   Ht 6' (1.829 m)   Wt (!) 302 lb (137 kg)   SpO2 95%   BMI 40.96 kg/m   Wt Readings from Last 3 Encounters:  12/23/16 (!) 302 lb (137 kg)  11/24/16 (!) 303 lb 8 oz (137.7 kg)  08/21/16 (!) 301 lb (136.5 kg)    Physical Exam  Constitutional: He is oriented to person, place, and time. He appears well-developed and well-nourished.  HENT:  Head: Normocephalic and atraumatic.  Neck: Neck supple.  Cardiovascular: Normal rate and regular rhythm.   Pulmonary/Chest: Effort normal and breath sounds normal. He has no wheezes.  Abdominal: Soft. Bowel sounds are normal. There is no hepatosplenomegaly. There is no tenderness.  Musculoskeletal: He exhibits no edema.  Lymphadenopathy:    He has no cervical adenopathy.  Neurological: He is alert and oriented to person, place, and time.  Skin: Skin is warm and dry.  Psychiatric: He has a normal mood and affect. His behavior is normal.  Vitals reviewed.   Results for orders placed or performed in visit on 11/19/16  HgB A1c  Result Value Ref Range   Hgb A1c MFr Bld 7.8 (H) <5.7 %   Mean Plasma Glucose 177 mg/dL  Comprehensive Metabolic Panel (CMET)  Result Value Ref Range   Sodium 138 135 - 146 mmol/L   Potassium 3.8 3.5 - 5.3 mmol/L   Chloride 102 98 - 110 mmol/L   CO2 26 20 - 31 mmol/L   Glucose, Bld 174 (H) 65 - 99 mg/dL   BUN 9 7 - 25 mg/dL   Creat 1.610.67 0.960.60 - 0.451.35 mg/dL   Total Bilirubin 0.4 0.2 - 1.2 mg/dL   Alkaline Phosphatase 56 40 - 115 U/L   AST 28 10 - 40 U/L   ALT 56 (H) 9 - 46 U/L   Total Protein 6.8 6.1 - 8.1 g/dL   Albumin 3.8 3.6 - 5.1 g/dL   Calcium 8.6 8.6 - 40.910.3 mg/dL  Lipid Profile  Result Value Ref Range   Cholesterol 149 <200 mg/dL   Triglycerides 811285 (H) <150 mg/dL   HDL 20 (L) >91>40 mg/dL   Total CHOL/HDL Ratio 7.5 (H) <5.0 Ratio   VLDL 57 (H) <30 mg/dL   LDL Cholesterol 72 <478<100 mg/dL      Assessment & Plan:    Encounter Diagnoses  Name Primary?  Marland Kitchen. Uncontrolled diabetes mellitus type 2  without complications, unspecified long term insulin use status (HCC) Yes  . Depression, unspecified depression type     Stop glipizide due to risks of hypoglycemia.  Increase lantus to 20u.   Pt to monitor bs and call office for fbs < 70 or > 300.  Pt to f/u with bs log in one month.  RTO sooner prn

## 2017-01-07 ENCOUNTER — Telehealth: Payer: Self-pay | Admitting: Student

## 2017-01-07 NOTE — Telephone Encounter (Signed)
Called and left voicemail for patient to call back.

## 2017-01-07 NOTE — Telephone Encounter (Signed)
Per PA, patient is to be on 20 Units of Lantus. If patient has no low blood sugars pt is to increase Lantus to 25 units and f/u with blood sugar log on 01-20-17.

## 2017-01-07 NOTE — Telephone Encounter (Signed)
Pt called clinic after hours on 01-06-17 and left a voicemail stating his blood sugar has not been under 200 since being taken off glipizide on 12-23-16. Pt states his blood sugar was over 300 over the weekend. Pt was calling to notify the office of his high blood sugar level as he was instructed to do if his blood sugar was over 300.

## 2017-01-08 ENCOUNTER — Telehealth: Payer: Self-pay | Admitting: Student

## 2017-01-08 ENCOUNTER — Encounter: Payer: Self-pay | Admitting: Physician Assistant

## 2017-01-08 NOTE — Telephone Encounter (Signed)
Called and left voicemail for patient to call back.  Will send letter out to patient for him to get in touch with the office.

## 2017-01-08 NOTE — Telephone Encounter (Signed)
Pt called back regarding 01-07-17 telephone note. Pt has been having high blood sugars lowest 198 and highest being 360. Pt states he has been using 20 units of Lantus as instructed at last OV.   Pt was advised to increase insulin to 25 units per PA's instructions. Pt is to bring blood sugar log to his f/u appointment 01-20-17. Pt verbalized understanding.

## 2017-01-20 ENCOUNTER — Ambulatory Visit: Payer: Self-pay | Admitting: Physician Assistant

## 2017-01-20 ENCOUNTER — Encounter: Payer: Self-pay | Admitting: Physician Assistant

## 2017-01-20 VITALS — BP 130/84 | HR 76 | Temp 97.3°F | Ht 72.0 in | Wt 298.0 lb

## 2017-01-20 DIAGNOSIS — I10 Essential (primary) hypertension: Secondary | ICD-10-CM

## 2017-01-20 DIAGNOSIS — IMO0001 Reserved for inherently not codable concepts without codable children: Secondary | ICD-10-CM

## 2017-01-20 DIAGNOSIS — E785 Hyperlipidemia, unspecified: Secondary | ICD-10-CM

## 2017-01-20 DIAGNOSIS — F32A Depression, unspecified: Secondary | ICD-10-CM

## 2017-01-20 DIAGNOSIS — F329 Major depressive disorder, single episode, unspecified: Secondary | ICD-10-CM

## 2017-01-20 DIAGNOSIS — F17219 Nicotine dependence, cigarettes, with unspecified nicotine-induced disorders: Secondary | ICD-10-CM

## 2017-01-20 DIAGNOSIS — E1165 Type 2 diabetes mellitus with hyperglycemia: Principal | ICD-10-CM

## 2017-01-20 NOTE — Patient Instructions (Signed)
Increase Lantus to 30units every night  Call office for fasting blood sugar over 300 or under 70

## 2017-01-20 NOTE — Progress Notes (Signed)
BP 130/84 (BP Location: Left Arm, Patient Position: Sitting, Cuff Size: Large)   Pulse 76   Temp 97.3 F (36.3 C) (Other (Comment))   Ht 6' (1.829 m)   Wt 298 lb (135.2 kg)   SpO2 97%   BMI 40.42 kg/m    Subjective:    Patient ID: Edwin Ford, male    DOB: 1981-03-22, 36 y.o.   MRN: [id number]  HPI: Edwin Ford is a 36 y.o. male presenting on 01/20/2017 for Diabetes   HPI Pt did not contact [facility] for MH  Pt has bs log- this am was 195. Yesterday was 232.  Lowest was 170  Pt still smoking  Pt feeling pretty well today  Relevant past medical, surgical, family and social history reviewed and updated as indicated. Interim medical history since our last visit reviewed. Allergies and medications reviewed and updated.   Current Outpatient Prescriptions:  .  albuterol (PROVENTIL HFA;VENTOLIN HFA) 108 (90 BASE) MCG/ACT inhaler, Inhale 2 puffs into the lungs every 6 (six) hours as needed for wheezing or shortness of breath., Disp: 3 Inhaler, Rfl: 1 .  ALPRAZolam (XANAX) 1 MG tablet, Take 1 tablet (1 mg total) by mouth 3 (three) times daily as needed for anxiety., Disp: 12 tablet, Rfl: 0 .  amLODipine (NORVASC) 5 MG tablet, Take 1 tablet (5 mg total) by mouth daily., Disp: 90 tablet, Rfl: 3 .  atorvastatin (LIPITOR) 20 MG tablet, TAKE 1 Tablet BY MOUTH ONCE DAILY, Disp: 90 tablet, Rfl: 2 .  clonazePAM (KLONOPIN) 1 MG tablet, Take 1 mg by mouth 3 (three) times daily as needed for anxiety., Disp: , Rfl:  .  insulin glargine (LANTUS) 100 UNIT/ML injection, Inject 5 Units into the skin at bedtime. Reported on 04/23/2016, Disp: , Rfl:  .  lisinopril (PRINIVIL,ZESTRIL) 20 MG tablet, Take 1 tablet (20 mg total) by mouth daily., Disp: 90 tablet, Rfl: 1 .  metFORMIN (GLUCOPHAGE) 1000 MG tablet, Take 1 tablet (1,000 mg total) by mouth 2 (two) times daily with a meal., Disp: 180 tablet, Rfl: 1 .  metoprolol (LOPRESSOR) 100 MG tablet, Take 1 tablet (100 mg total) by mouth 2 (two) times  daily., Disp: 180 tablet, Rfl: 3 .  Omega-3 Fatty Acids (FISH OIL) 1000 MG CAPS, Take by mouth. Reported on 04/23/2016, Disp: , Rfl:  .  omeprazole (PRILOSEC) 20 MG capsule, TAKE 1 Capsule  BY MOUTH TWICE DAILY, Disp: 180 capsule, Rfl: 2 .  sertraline (ZOLOFT) 100 MG tablet, Take 2 tablets (200 mg total) by mouth daily., Disp: 60 tablet, Rfl: 1 .  sitaGLIPtin (JANUVIA) 100 MG tablet, Take 1 tablet (100 mg total) by mouth daily., Disp: 90 tablet, Rfl: 1   Review of Systems  Constitutional: Negative for appetite change, chills, diaphoresis, fatigue, fever and unexpected weight change.  HENT: Negative for congestion, dental problem, drooling, ear pain, facial swelling, hearing loss, mouth sores, sneezing, sore throat, trouble swallowing and voice change.   Eyes: Positive for visual disturbance. Negative for pain, discharge, redness and itching.  Respiratory: Negative for cough, choking, shortness of breath and wheezing.   Cardiovascular: Negative for chest pain, palpitations and leg swelling.  Gastrointestinal: Negative for abdominal pain, blood in stool, constipation, diarrhea and vomiting.  Endocrine: Negative for cold intolerance, heat intolerance and polydipsia.  Genitourinary: Negative for decreased urine volume, dysuria and hematuria.  Musculoskeletal: Positive for arthralgias. Negative for back pain and gait problem.  Skin: Negative for rash.  Allergic/Immunologic: Negative for environmental allergies.  Neurological: Negative for  seizures, syncope, light-headedness and headaches.  Hematological: Negative for adenopathy.  Psychiatric/Behavioral: Negative for agitation, dysphoric mood and suicidal ideas. The patient is not nervous/anxious.     Per HPI unless specifically indicated above     Objective:    BP 130/84 (BP Location: Left Arm, Patient Position: Sitting, Cuff Size: Large)   Pulse 76   Temp 97.3 F (36.3 C) (Other (Comment))   Ht 6' (1.829 m)   Wt 298 lb (135.2 kg)   SpO2  97%   BMI 40.42 kg/m   Wt Readings from Last 3 Encounters:  01/20/17 298 lb (135.2 kg)  12/23/16 (!) 302 lb (137 kg)  11/24/16 (!) 303 lb 8 oz (137.7 kg)    Physical Exam  Constitutional: He is oriented to person, place, and time. He appears well-developed and well-nourished.  HENT:  Head: Normocephalic and atraumatic.  Neck: Neck supple.  Cardiovascular: Normal rate and regular rhythm.   Pulmonary/Chest: Effort normal and breath sounds normal. He has no wheezes.  Abdominal: Soft. Bowel sounds are normal. There is no hepatosplenomegaly. There is no tenderness.  Musculoskeletal: He exhibits no edema.  Lymphadenopathy:    He has no cervical adenopathy.  Neurological: He is alert and oriented to person, place, and time.  Skin: Skin is warm and dry.  Psychiatric: He has a normal mood and affect. His behavior is normal.  Vitals reviewed.       Assessment & Plan:   Encounter Diagnoses  Name Primary?  Marland Kitchen Uncontrolled diabetes mellitus type 2 without complications, unspecified long term insulin use status (HCC) Yes  . Essential hypertension, benign   . Hyperlipidemia, unspecified hyperlipidemia type   . Cigarette nicotine dependence with nicotine-induced disorder   . Depression, unspecified depression type   . Morbid obesity (HCC)      -incease to lantus to 30 units -Monitor bs. Pt reminded to call office for fbs < 70 or > 300 -Follow up 1 month.  RTO sooner prn

## 2017-02-12 ENCOUNTER — Other Ambulatory Visit: Payer: Self-pay

## 2017-02-12 DIAGNOSIS — IMO0001 Reserved for inherently not codable concepts without codable children: Secondary | ICD-10-CM

## 2017-02-12 DIAGNOSIS — E1165 Type 2 diabetes mellitus with hyperglycemia: Principal | ICD-10-CM

## 2017-02-12 DIAGNOSIS — E785 Hyperlipidemia, unspecified: Secondary | ICD-10-CM

## 2017-02-12 DIAGNOSIS — I10 Essential (primary) hypertension: Secondary | ICD-10-CM

## 2017-02-18 ENCOUNTER — Other Ambulatory Visit: Payer: Self-pay | Admitting: Physician Assistant

## 2017-02-19 LAB — COMPREHENSIVE METABOLIC PANEL
ALK PHOS: 69 U/L (ref 40–115)
ALT: 62 U/L — AB (ref 9–46)
AST: 34 U/L (ref 10–40)
Albumin: 4.2 g/dL (ref 3.6–5.1)
BUN: 9 mg/dL (ref 7–25)
CO2: 26 mmol/L (ref 20–31)
CREATININE: 0.75 mg/dL (ref 0.60–1.35)
Calcium: 9 mg/dL (ref 8.6–10.3)
Chloride: 102 mmol/L (ref 98–110)
Glucose, Bld: 186 mg/dL — ABNORMAL HIGH (ref 65–99)
Potassium: 3.6 mmol/L (ref 3.5–5.3)
SODIUM: 137 mmol/L (ref 135–146)
TOTAL PROTEIN: 6.9 g/dL (ref 6.1–8.1)
Total Bilirubin: 0.5 mg/dL (ref 0.2–1.2)

## 2017-02-19 LAB — LIPID PANEL
CHOL/HDL RATIO: 7 ratio — AB (ref <5.0)
CHOLESTEROL: 139 mg/dL (ref <200)
HDL: 20 mg/dL — ABNORMAL LOW (ref >40)
LDL Cholesterol: 67 mg/dL (ref <100)
TRIGLYCERIDES: 260 mg/dL — AB (ref <150)
VLDL: 52 mg/dL — ABNORMAL HIGH (ref <30)

## 2017-02-20 LAB — HEMOGLOBIN A1C
Hgb A1c MFr Bld: 7.7 % — ABNORMAL HIGH (ref <5.7)
Mean Plasma Glucose: 174 mg/dL

## 2017-02-25 ENCOUNTER — Encounter: Payer: Self-pay | Admitting: Physician Assistant

## 2017-02-25 ENCOUNTER — Ambulatory Visit: Payer: Self-pay | Admitting: Physician Assistant

## 2017-02-25 VITALS — BP 142/82 | HR 78 | Temp 97.3°F | Ht 72.0 in | Wt 299.8 lb

## 2017-02-25 DIAGNOSIS — F329 Major depressive disorder, single episode, unspecified: Secondary | ICD-10-CM

## 2017-02-25 DIAGNOSIS — K219 Gastro-esophageal reflux disease without esophagitis: Secondary | ICD-10-CM

## 2017-02-25 DIAGNOSIS — E1165 Type 2 diabetes mellitus with hyperglycemia: Principal | ICD-10-CM

## 2017-02-25 DIAGNOSIS — E785 Hyperlipidemia, unspecified: Secondary | ICD-10-CM

## 2017-02-25 DIAGNOSIS — F32A Depression, unspecified: Secondary | ICD-10-CM

## 2017-02-25 DIAGNOSIS — F17219 Nicotine dependence, cigarettes, with unspecified nicotine-induced disorders: Secondary | ICD-10-CM

## 2017-02-25 DIAGNOSIS — I10 Essential (primary) hypertension: Secondary | ICD-10-CM

## 2017-02-25 DIAGNOSIS — IMO0001 Reserved for inherently not codable concepts without codable children: Secondary | ICD-10-CM

## 2017-02-25 NOTE — Progress Notes (Signed)
BP (!) 142/82 (BP Location: Left Arm, Patient Position: Sitting, Cuff Size: Large)   Pulse 78   Temp 97.3 F (36.3 C) (Other (Comment))   Ht 6' (1.829 m)   Wt 299 lb 12 oz (136 kg)   SpO2 98%   BMI 40.65 kg/m    Subjective:    Patient ID: Edwin Ford, male    DOB: 08/07/1981, 36 y.o.   MRN: [id number]  HPI: DARRIC PLANTE is a 36 y.o. male presenting on 02/25/2017 for Diabetes   HPI   Pt has not been checking his bs regularly.  He didn't even write down anything on his bs log so he didn't bring it.    He didn't contact new MH provider.  He says he didn't due to transportation issues.    Relevant past medical, surgical, family and social history reviewed and updated as indicated. Interim medical history since our last visit reviewed. Allergies and medications reviewed and updated.   Current Outpatient Prescriptions:  .  albuterol (PROVENTIL HFA;VENTOLIN HFA) 108 (90 BASE) MCG/ACT inhaler, Inhale 2 puffs into the lungs every 6 (six) hours as needed for wheezing or shortness of breath., Disp: 3 Inhaler, Rfl: 1 .  ALPRAZolam (XANAX) 1 MG tablet, Take 1 tablet (1 mg total) by mouth 3 (three) times daily as needed for anxiety., Disp: 12 tablet, Rfl: 0 .  amLODipine (NORVASC) 5 MG tablet, Take 1 tablet (5 mg total) by mouth daily., Disp: 90 tablet, Rfl: 3 .  atorvastatin (LIPITOR) 20 MG tablet, TAKE 1 Tablet BY MOUTH ONCE DAILY, Disp: 90 tablet, Rfl: 2 .  clonazePAM (KLONOPIN) 1 MG tablet, Take 1 mg by mouth 3 (three) times daily as needed for anxiety., Disp: , Rfl:  .  insulin glargine (LANTUS) 100 UNIT/ML injection, Inject 5 Units into the skin at bedtime. Reported on 04/23/2016, Disp: , Rfl:  .  JANUVIA 100 MG tablet, TAKE 1 Tablet BY MOUTH ONCE DAILY, Disp: 90 tablet, Rfl: 2 .  lisinopril (PRINIVIL,ZESTRIL) 20 MG tablet, Take 1 tablet (20 mg total) by mouth daily., Disp: 90 tablet, Rfl: 1 .  metFORMIN (GLUCOPHAGE) 1000 MG tablet, TAKE 1 Tablet  BY MOUTH TWICE DAILY WITH MEALS,  Disp: 180 tablet, Rfl: 2 .  metoprolol (LOPRESSOR) 100 MG tablet, Take 1 tablet (100 mg total) by mouth 2 (two) times daily., Disp: 180 tablet, Rfl: 3 .  omeprazole (PRILOSEC) 20 MG capsule, TAKE 1 Capsule  BY MOUTH TWICE DAILY, Disp: 180 capsule, Rfl: 1 .  sertraline (ZOLOFT) 100 MG tablet, Take 2 tablets (200 mg total) by mouth daily., Disp: 60 tablet, Rfl: 1 .  Omega-3 Fatty Acids (FISH OIL) 1000 MG CAPS, Take by mouth. Reported on 04/23/2016, Disp: , Rfl:    Review of Systems  Constitutional: Negative for appetite change, chills, diaphoresis, fatigue, fever and unexpected weight change.  HENT: Negative for congestion, dental problem, drooling, ear pain, facial swelling, hearing loss, mouth sores, sneezing, sore throat, trouble swallowing and voice change.   Eyes: Negative for pain, discharge, redness, itching and visual disturbance.  Respiratory: Negative for cough, choking, shortness of breath and wheezing.   Cardiovascular: Negative for chest pain, palpitations and leg swelling.  Gastrointestinal: Negative for abdominal pain, blood in stool, constipation, diarrhea and vomiting.  Endocrine: Negative for cold intolerance, heat intolerance and polydipsia.  Genitourinary: Negative for decreased urine volume, dysuria and hematuria.  Musculoskeletal: Negative for arthralgias, back pain and gait problem.  Skin: Negative for rash.  Allergic/Immunologic: Negative for environmental allergies.  Neurological: Negative for seizures, syncope, light-headedness and headaches.  Hematological: Negative for adenopathy.  Psychiatric/Behavioral: Positive for dysphoric mood. Negative for agitation and suicidal ideas. The patient is not nervous/anxious.     Per HPI unless specifically indicated above     Objective:    BP (!) 142/82 (BP Location: Left Arm, Patient Position: Sitting, Cuff Size: Large)   Pulse 78   Temp 97.3 F (36.3 C) (Other (Comment))   Ht 6' (1.829 m)   Wt 299 lb 12 oz (136 kg)    SpO2 98%   BMI 40.65 kg/m   Wt Readings from Last 3 Encounters:  02/25/17 299 lb 12 oz (136 kg)  01/20/17 298 lb (135.2 kg)  12/23/16 (!) 302 lb (137 kg)    Physical Exam  Constitutional: He is oriented to person, place, and time. He appears well-developed and well-nourished.  HENT:  Head: Normocephalic and atraumatic.  Neck: Neck supple.  Cardiovascular: Normal rate and regular rhythm.   Pulmonary/Chest: Effort normal and breath sounds normal. He has no wheezes.  Abdominal: Soft. Bowel sounds are normal. There is no hepatosplenomegaly. There is no tenderness.  Musculoskeletal: He exhibits no edema.  Lymphadenopathy:    He has no cervical adenopathy.  Neurological: He is alert and oriented to person, place, and time.  Skin: Skin is warm and dry.  Psychiatric: He has a normal mood and affect. His behavior is normal.  Vitals reviewed.   Results for orders placed or performed in visit on 02/12/17  Comprehensive metabolic panel  Result Value Ref Range   Sodium 137 135 - 146 mmol/L   Potassium 3.6 3.5 - 5.3 mmol/L   Chloride 102 98 - 110 mmol/L   CO2 26 20 - 31 mmol/L   Glucose, Bld 186 (H) 65 - 99 mg/dL   BUN 9 7 - 25 mg/dL   Creat 4.09 8.11 - 9.14 mg/dL   Total Bilirubin 0.5 0.2 - 1.2 mg/dL   Alkaline Phosphatase 69 40 - 115 U/L   AST 34 10 - 40 U/L   ALT 62 (H) 9 - 46 U/L   Total Protein 6.9 6.1 - 8.1 g/dL   Albumin 4.2 3.6 - 5.1 g/dL   Calcium 9.0 8.6 - 78.2 mg/dL  Lipid panel  Result Value Ref Range   Cholesterol 139 <200 mg/dL   Triglycerides 956 (H) <150 mg/dL   HDL 20 (L) >21 mg/dL   Total CHOL/HDL Ratio 7.0 (H) <5.0 Ratio   VLDL 52 (H) <30 mg/dL   LDL Cholesterol 67 <308 mg/dL  Hemoglobin M5H  Result Value Ref Range   Hgb A1c MFr Bld 7.7 (H) <5.7 %   Mean Plasma Glucose 174 mg/dL      Assessment & Plan:   Encounter Diagnoses  Name Primary?  Marland Kitchen Uncontrolled diabetes mellitus type 2 without complications, unspecified long term insulin use status (HCC) Yes   . Essential hypertension, benign   . Hyperlipidemia, unspecified hyperlipidemia type   . Depression, unspecified depression type   . Morbid obesity (HCC)   . Cigarette nicotine dependence with nicotine-induced disorder   . Gastroesophageal reflux disease, esophagitis presence not specified      -reviewed labs with pt -incrase lantus to 35 u.  Monitor bs. Call if fbs < 70 or > 300 -pt to continue other medications -pt to Contact MH specialist.  -follow up in 3 months.  RTO sooner prn

## 2017-03-12 ENCOUNTER — Other Ambulatory Visit: Payer: Self-pay | Admitting: Physician Assistant

## 2017-03-12 MED ORDER — LISINOPRIL 20 MG PO TABS: 20.0000 mg | ORAL_TABLET | Freq: Every day | ORAL | 0 refills | Status: DC

## 2017-03-12 MED ORDER — METFORMIN HCL 1000 MG PO TABS: 1000.0000 mg | ORAL_TABLET | Freq: Two times a day (BID) | ORAL | 0 refills | Status: DC

## 2017-03-12 MED ORDER — METOPROLOL TARTRATE 100 MG PO TABS: 100.0000 mg | ORAL_TABLET | Freq: Two times a day (BID) | ORAL | 0 refills | Status: DC

## 2017-03-12 MED ORDER — SERTRALINE HCL 100 MG PO TABS: 200.0000 mg | ORAL_TABLET | Freq: Every day | ORAL | 0 refills | Status: DC

## 2017-03-12 MED ORDER — AMLODIPINE BESYLATE 5 MG PO TABS: 5.0000 mg | ORAL_TABLET | Freq: Every day | ORAL | 0 refills | Status: DC

## 2017-04-28 ENCOUNTER — Other Ambulatory Visit: Payer: Self-pay | Admitting: Physician Assistant

## 2017-04-28 DIAGNOSIS — I10 Essential (primary) hypertension: Secondary | ICD-10-CM

## 2017-04-28 DIAGNOSIS — E118 Type 2 diabetes mellitus with unspecified complications: Secondary | ICD-10-CM

## 2017-04-28 DIAGNOSIS — E785 Hyperlipidemia, unspecified: Secondary | ICD-10-CM

## 2017-05-19 IMAGING — CT CT ABD-PELV W/ CM
2 of 4 series · 17 of 46 positions shown, 19 images · IV contrast (Omni 300)
Comparison: 11/29/2006

CLINICAL DATA: Hot sweaty clammy nausea no appetite runny nose for
2 days

EXAM:
CT ABDOMEN AND PELVIS WITH CONTRAST
TECHNIQUE: Multidetector CT imaging of the abdomen and pelvis was performed
using the standard protocol following bolus administration of
intravenous contrast.
CONTRAST:  100mL OMNIPAQUE IOHEXOL 300 MG/ML  SOLN

[Series 2: a/p w/ 5mm · axial · 0.90mm/px · z∈[-556,-56]mm · 14 of 110 slices shown, 16 images]
[im 5/110  soft-tissue]
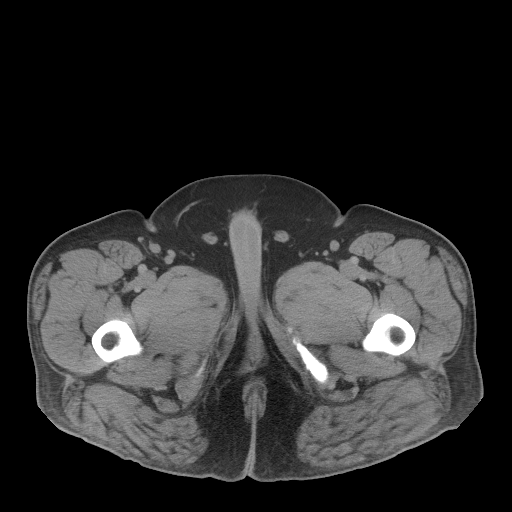
[im 5/110  bone]
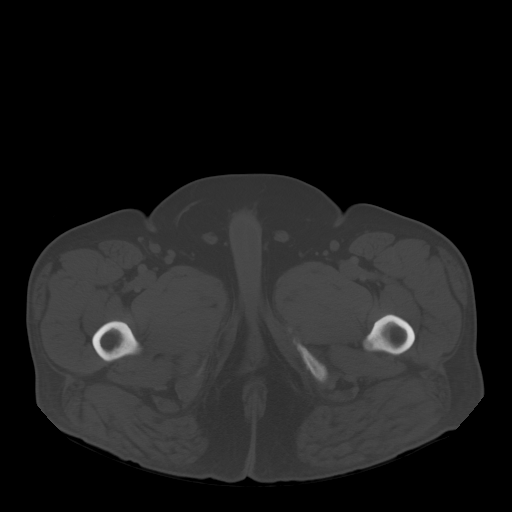
[im 15/110  soft-tissue]
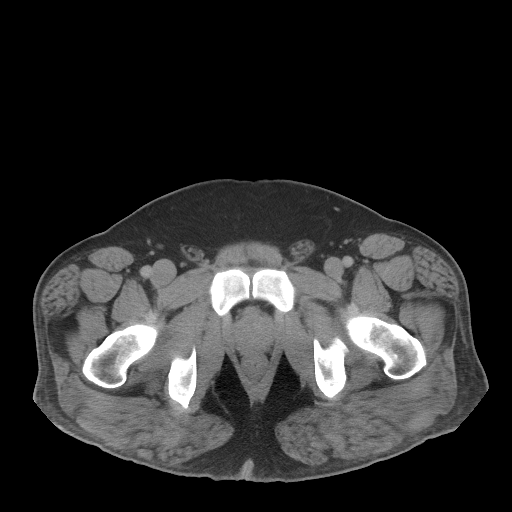
[im 20/110  soft-tissue]
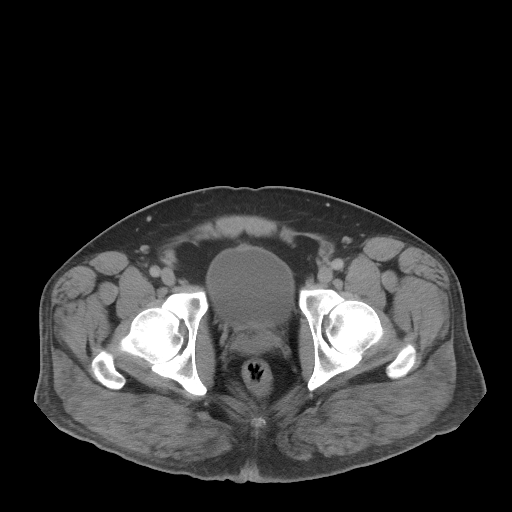
[im 30/110  soft-tissue]
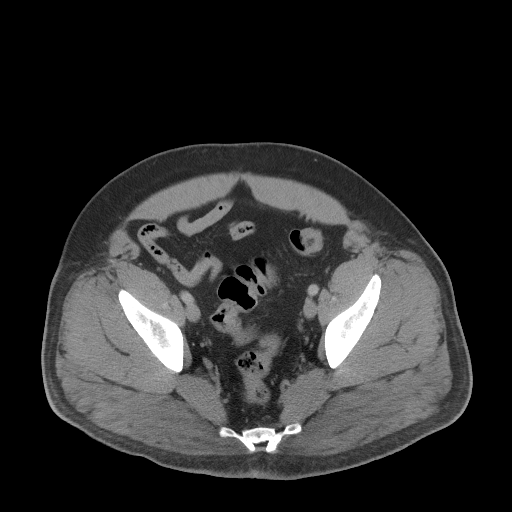
[im 35/110  soft-tissue]
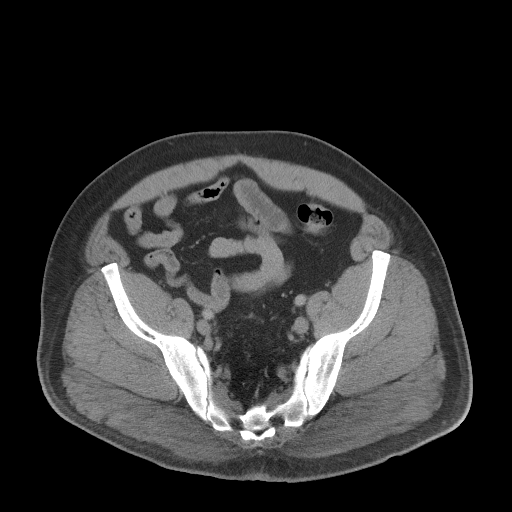
[im 45/110  soft-tissue]
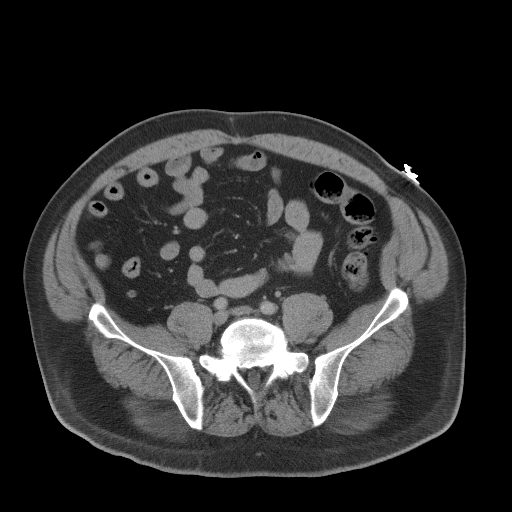
[im 50/110  soft-tissue]
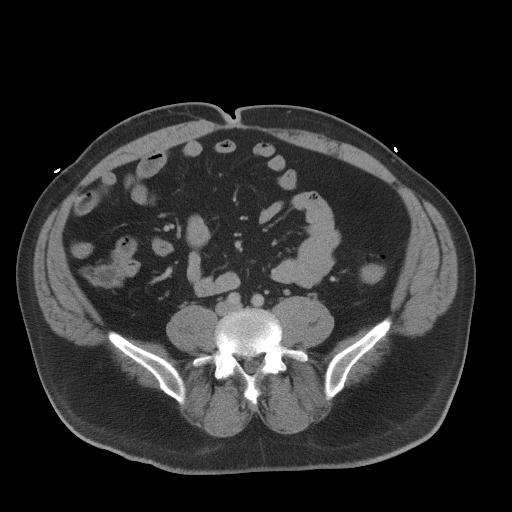
[im 60/110  soft-tissue]
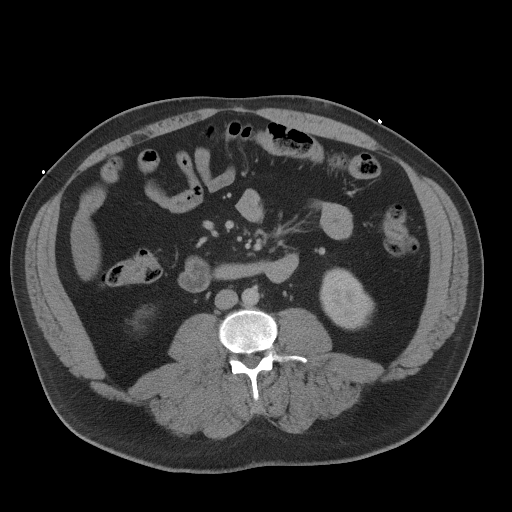
[im 65/110  soft-tissue]
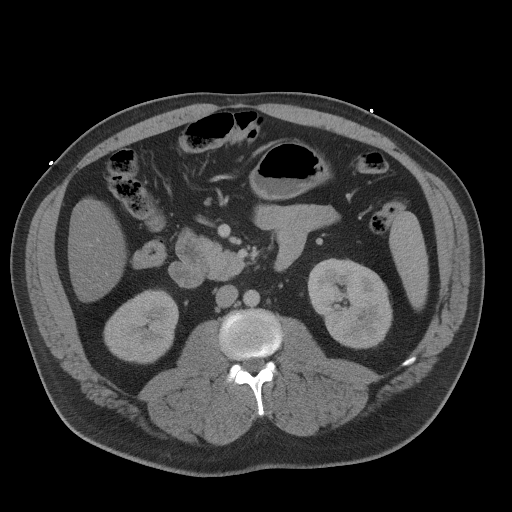
[im 65/110  bone]
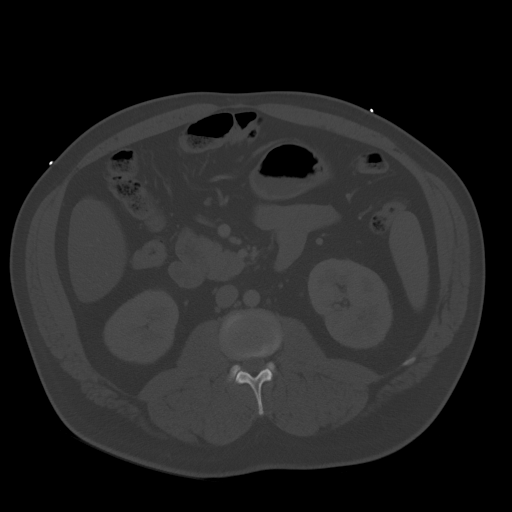
[im 75/110  soft-tissue]
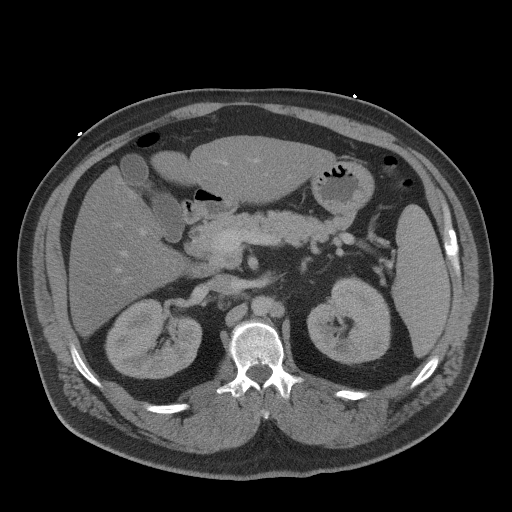
[im 80/110  soft-tissue]
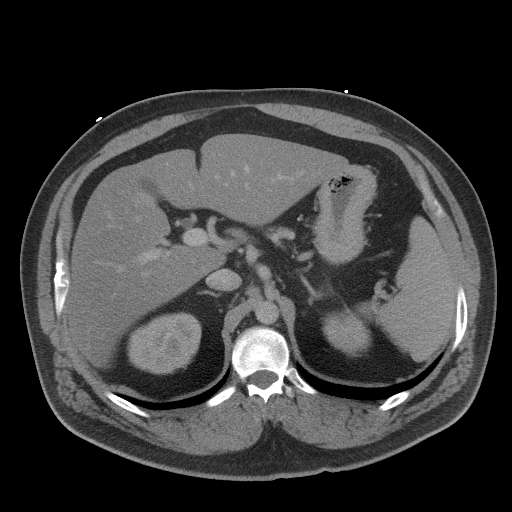
[im 90/110  soft-tissue]
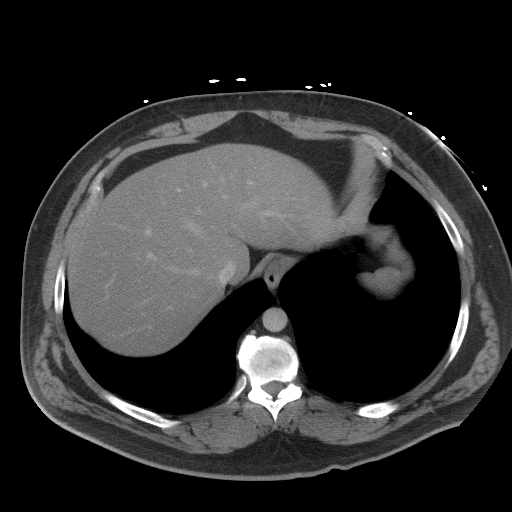
[im 95/110  soft-tissue]
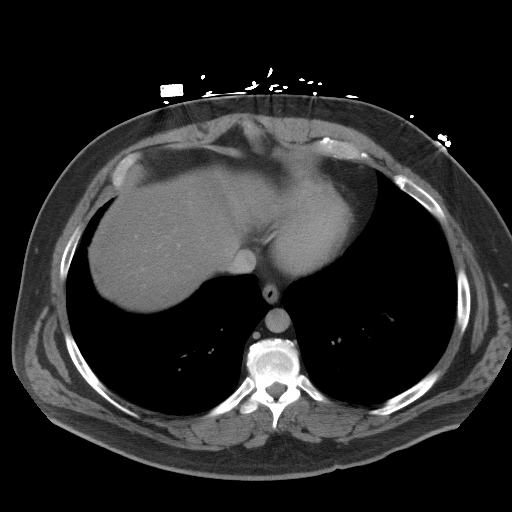
[im 105/110  soft-tissue]
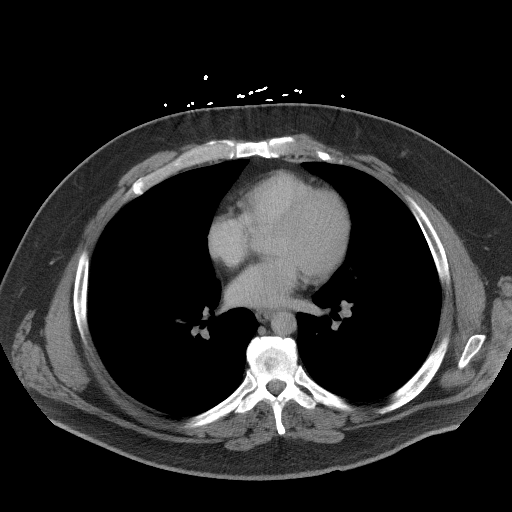

[Series 5: a/p w/ cor · coronal · 0.92mm/px · 3 of 157 slices shown]
[im 53/157  soft-tissue]
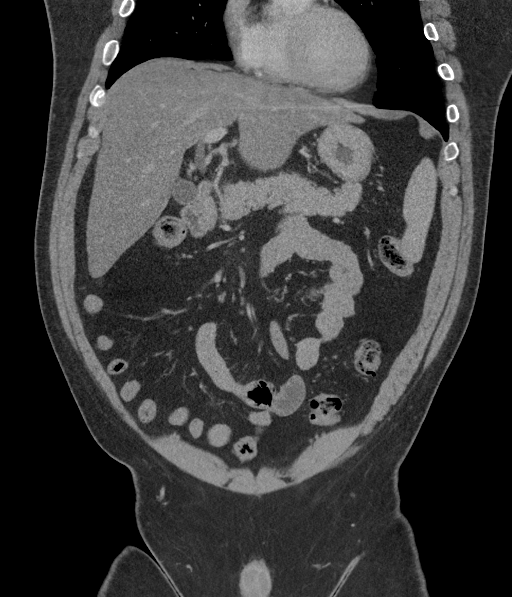
[im 70/157  soft-tissue]
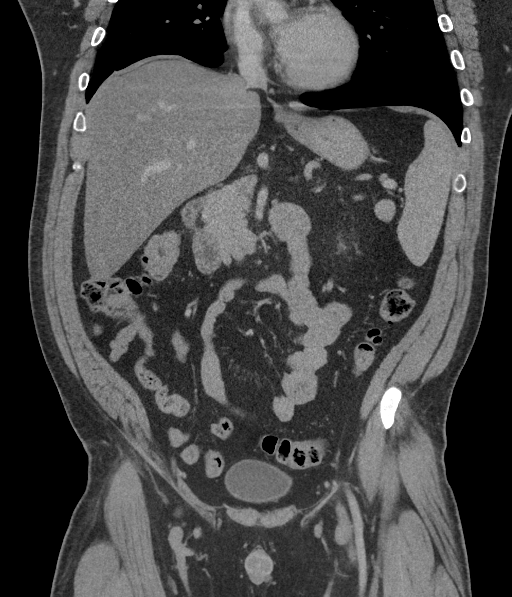
[im 87/157  soft-tissue]
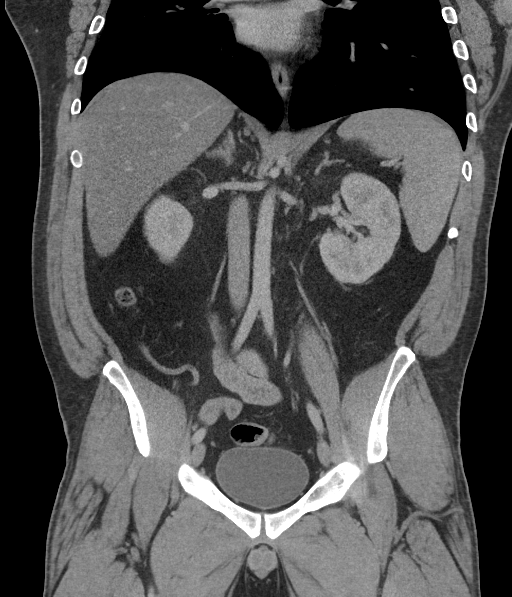

[17 of 46 positions shown; findings below may reference images not displayed]

FINDINGS: Lower chest:  Normal

Hepatobiliary: Moderate diffuse hepatic steatosis

Pancreas: Normal

Spleen: No significant abnormalities.  2 cm splenule noted.

Adrenals/Urinary Tract: Normal

Stomach/Bowel: Normal including appendix

Vascular/Lymphatic: No vascular abnormalities. No significant
abdominal or pelvic adenopathy.

Reproductive: Normal

Other: No ascites

Musculoskeletal: No acute findings. Moderate calcified disc
osteophytic bulge L5-S1 causing canal narrowing. Multilevel
degenerative disc disease lower thoracic spine and upper lumbar
spine.
IMPRESSION: No acute findings to explain the patient's symptoms. Hepatic
steatosis as previously described.

## 2017-05-27 ENCOUNTER — Other Ambulatory Visit (HOSPITAL_COMMUNITY)
Admission: RE | Admit: 2017-05-27 | Discharge: 2017-05-27 | Disposition: A | Discharge status: Home / Self Care | Payer: Self-pay | Source: Ambulatory Visit | Attending: Physician Assistant | Admitting: Physician Assistant

## 2017-05-27 ENCOUNTER — Ambulatory Visit: Payer: Self-pay | Admitting: Physician Assistant

## 2017-05-27 ENCOUNTER — Encounter: Payer: Self-pay | Admitting: Physician Assistant

## 2017-05-27 VITALS — BP 120/80 | HR 78 | Temp 97.2°F | Ht 72.0 in | Wt 292.2 lb

## 2017-05-27 DIAGNOSIS — E118 Type 2 diabetes mellitus with unspecified complications: Principal | ICD-10-CM

## 2017-05-27 DIAGNOSIS — IMO0002 Reserved for concepts with insufficient information to code with codable children: Secondary | ICD-10-CM

## 2017-05-27 DIAGNOSIS — I10 Essential (primary) hypertension: Secondary | ICD-10-CM | POA: Insufficient documentation

## 2017-05-27 DIAGNOSIS — E785 Hyperlipidemia, unspecified: Secondary | ICD-10-CM

## 2017-05-27 DIAGNOSIS — F17219 Nicotine dependence, cigarettes, with unspecified nicotine-induced disorders: Secondary | ICD-10-CM

## 2017-05-27 DIAGNOSIS — Z794 Long term (current) use of insulin: Principal | ICD-10-CM

## 2017-05-27 DIAGNOSIS — E1165 Type 2 diabetes mellitus with hyperglycemia: Secondary | ICD-10-CM

## 2017-05-27 DIAGNOSIS — F329 Major depressive disorder, single episode, unspecified: Secondary | ICD-10-CM

## 2017-05-27 DIAGNOSIS — E669 Obesity, unspecified: Secondary | ICD-10-CM

## 2017-05-27 DIAGNOSIS — F32A Depression, unspecified: Secondary | ICD-10-CM

## 2017-05-27 LAB — COMPREHENSIVE METABOLIC PANEL
ALK PHOS: 60 U/L (ref 38–126)
ALT: 58 U/L (ref 17–63)
ANION GAP: 10 (ref 5–15)
AST: 27 U/L (ref 15–41)
Albumin: 3.8 g/dL (ref 3.5–5.0)
BILIRUBIN TOTAL: 0.7 mg/dL (ref 0.3–1.2)
BUN: 8 mg/dL (ref 6–20)
CALCIUM: 9.1 mg/dL (ref 8.9–10.3)
CO2: 24 mmol/L (ref 22–32)
Chloride: 98 mmol/L — ABNORMAL LOW (ref 101–111)
Creatinine, Ser: 0.72 mg/dL (ref 0.61–1.24)
GLUCOSE: 297 mg/dL — AB (ref 65–99)
POTASSIUM: 3.9 mmol/L (ref 3.5–5.1)
Sodium: 132 mmol/L — ABNORMAL LOW (ref 135–145)
TOTAL PROTEIN: 6.9 g/dL (ref 6.5–8.1)

## 2017-05-27 LAB — LIPID PANEL
CHOL/HDL RATIO: 6.4 ratio
CHOLESTEROL: 134 mg/dL (ref 0–200)
HDL: 21 mg/dL — AB (ref >40)
LDL Cholesterol: 39 mg/dL (ref 0–99)
Triglycerides: 371 mg/dL — ABNORMAL HIGH (ref <150)
VLDL: 74 mg/dL — AB (ref 0–40)

## 2017-05-27 NOTE — Progress Notes (Signed)
BP 120/80 (BP Location: Left Arm, Patient Position: Sitting, Cuff Size: Normal)   Pulse 78   Temp (!) 97.2 F (36.2 C)   Ht 6' (1.829 m)   Wt 292 lb 4 oz (132.6 kg)   SpO2 97%   BMI 39.64 kg/m    Subjective:    Patient ID: Edwin Ford, male    DOB: 01-Aug-1981, 36 y.o.   MRN: [id number]  HPI: Edwin Ford is a 36 y.o. male presenting on 05/27/2017 for Diabetes; Hyperlipidemia; COPD; and Hypertension   HPI   Pt just went to the lab this morning before appointment.   Pt checking bs at home. He says it's not been over 300.   Pt has been out of insulin for about a week.  He says it was running in the 200's, and that was before he ran out of his lantus.    He says it seems like it is high every other day, not every day.  On the "not high" days, it's about 179.    Pt has still not contacted MH specialist.   Relevant past medical, surgical, family and social history reviewed and updated as indicated. Interim medical history since our last visit reviewed. Allergies and medications reviewed and updated.   Current Outpatient Prescriptions:  .  albuterol (PROVENTIL HFA;VENTOLIN HFA) 108 (90 BASE) MCG/ACT inhaler, Inhale 2 puffs into the lungs every 6 (six) hours as needed for wheezing or shortness of breath., Disp: 3 Inhaler, Rfl: 1 .  ALPRAZolam (XANAX) 1 MG tablet, Take 1 tablet (1 mg total) by mouth 3 (three) times daily as needed for anxiety. (Patient taking differently: Take 2 mg by mouth 3 (three) times daily as needed for anxiety. ), Disp: 12 tablet, Rfl: 0 .  amLODipine (NORVASC) 5 MG tablet, Take 1 tablet (5 mg total) by mouth daily., Disp: 30 tablet, Rfl: 0 .  atorvastatin (LIPITOR) 20 MG tablet, TAKE 1 Tablet BY MOUTH ONCE DAILY, Disp: 90 tablet, Rfl: 2 .  clonazePAM (KLONOPIN) 1 MG tablet, Take 1 mg by mouth 3 (three) times daily as needed for anxiety., Disp: , Rfl:  .  JANUVIA 100 MG tablet, TAKE 1 Tablet BY MOUTH ONCE DAILY, Disp: 90 tablet, Rfl: 2 .  lisinopril  (PRINIVIL,ZESTRIL) 20 MG tablet, Take 1 tablet (20 mg total) by mouth daily., Disp: 30 tablet, Rfl: 0 .  metFORMIN (GLUCOPHAGE) 1000 MG tablet, Take 1 tablet (1,000 mg total) by mouth 2 (two) times daily with a meal., Disp: 60 tablet, Rfl: 0 .  metoprolol (LOPRESSOR) 100 MG tablet, Take 1 tablet (100 mg total) by mouth 2 (two) times daily., Disp: 60 tablet, Rfl: 0 .  omeprazole (PRILOSEC) 20 MG capsule, TAKE 1 Capsule  BY MOUTH TWICE DAILY, Disp: 180 capsule, Rfl: 1 .  sertraline (ZOLOFT) 100 MG tablet, Take 2 tablets (200 mg total) by mouth daily., Disp: 60 tablet, Rfl: 0 .  insulin glargine (LANTUS) 100 UNIT/ML injection, Inject 35 Units into the skin at bedtime. Reported on 04/23/2016, Disp: , Rfl:  .  Omega-3 Fatty Acids (FISH OIL) 1000 MG CAPS, Take by mouth. Reported on 04/23/2016, Disp: , Rfl:    Review of Systems  Constitutional: Negative for appetite change, chills, diaphoresis, fatigue, fever and unexpected weight change.  HENT: Negative for congestion, dental problem, drooling, ear pain, facial swelling, hearing loss, mouth sores, sneezing, sore throat, trouble swallowing and voice change.   Eyes: Negative for pain, discharge, redness, itching and visual disturbance.  Respiratory: Negative for  cough, choking, shortness of breath and wheezing.   Cardiovascular: Negative for chest pain, palpitations and leg swelling.  Gastrointestinal: Negative for abdominal pain, blood in stool, constipation, diarrhea and vomiting.  Endocrine: Negative for cold intolerance, heat intolerance and polydipsia.  Genitourinary: Negative for decreased urine volume, dysuria and hematuria.  Musculoskeletal: Negative for arthralgias, back pain and gait problem.  Skin: Negative for rash.  Allergic/Immunologic: Negative for environmental allergies.  Neurological: Negative for seizures, syncope, light-headedness and headaches.  Hematological: Negative for adenopathy.  Psychiatric/Behavioral: Negative for agitation,  dysphoric mood and suicidal ideas. The patient is not nervous/anxious.     Per HPI unless specifically indicated above     Objective:    BP 120/80 (BP Location: Left Arm, Patient Position: Sitting, Cuff Size: Normal)   Pulse 78   Temp (!) 97.2 F (36.2 C)   Ht 6' (1.829 m)   Wt 292 lb 4 oz (132.6 kg)   SpO2 97%   BMI 39.64 kg/m   Wt Readings from Last 3 Encounters:  05/27/17 292 lb 4 oz (132.6 kg)  02/25/17 299 lb 12 oz (136 kg)  01/20/17 298 lb (135.2 kg)    Physical Exam  Constitutional: He is oriented to person, place, and time. He appears well-developed and well-nourished.  HENT:  Head: Normocephalic and atraumatic.  Neck: Neck supple.  Cardiovascular: Normal rate and regular rhythm.   Pulmonary/Chest: Effort normal and breath sounds normal. He has no wheezes.  Abdominal: Soft. Bowel sounds are normal. There is no hepatosplenomegaly. There is no tenderness.  Musculoskeletal: He exhibits no edema.  Feet skin healthy and intact with no ulcerations or wounds  Lymphadenopathy:    He has no cervical adenopathy.  Neurological: He is alert and oriented to person, place, and time.  Skin: Skin is warm and dry.  Psychiatric: He has a normal mood and affect. His behavior is normal.  Vitals reviewed.        Assessment & Plan:    Encounter Diagnoses  Name Primary?  Marland Kitchen Uncontrolled type 2 diabetes mellitus with complication, with long-term current use of insulin (HCC) Yes  . Essential hypertension, benign   . Hyperlipidemia, unspecified hyperlipidemia type   . Cigarette nicotine dependence with nicotine-induced disorder   . Obesity, unspecified classification, unspecified obesity type, unspecified whether serious comorbidity present   . Depression, unspecified depression type     -Gave [facility] card for pt to contact MH specialist.  -Counseled smoking cessation -will call pt with lab results.   -counseled pt to get back on fish oil -pt to monitor bs and notify office  for fbs < 70 or >300.  Encouraged pt to avoid running out of his insulin -follow up OV in 3 months.  RTO sooner prn

## 2017-05-28 LAB — HEMOGLOBIN A1C
HEMOGLOBIN A1C: 8.3 % — AB (ref 4.8–5.6)
MEAN PLASMA GLUCOSE: 192 mg/dL

## 2017-06-25 ENCOUNTER — Other Ambulatory Visit: Payer: Self-pay | Admitting: Physician Assistant

## 2017-06-25 MED ORDER — LISINOPRIL 20 MG PO TABS: 20.0000 mg | ORAL_TABLET | Freq: Every day | ORAL | 2 refills | Status: DC

## 2017-08-13 ENCOUNTER — Other Ambulatory Visit: Payer: Self-pay | Admitting: Physician Assistant

## 2017-08-13 MED ORDER — OMEPRAZOLE 20 MG PO CPDR: 20.0000 mg | DELAYED_RELEASE_CAPSULE | Freq: Two times a day (BID) | ORAL | 1 refills | Status: DC

## 2017-08-13 MED ORDER — METFORMIN HCL 1000 MG PO TABS: 1000.0000 mg | ORAL_TABLET | Freq: Two times a day (BID) | ORAL | 1 refills | Status: DC

## 2017-08-13 MED ORDER — ATORVASTATIN CALCIUM 20 MG PO TABS: 20.0000 mg | ORAL_TABLET | Freq: Every day | ORAL | 2 refills | Status: DC

## 2017-08-13 MED ORDER — SITAGLIPTIN PHOSPHATE 100 MG PO TABS: 100.0000 mg | ORAL_TABLET | Freq: Every day | ORAL | 1 refills | Status: DC

## 2017-08-13 MED ORDER — LISINOPRIL 20 MG PO TABS: 20.0000 mg | ORAL_TABLET | Freq: Every day | ORAL | 2 refills | Status: DC

## 2017-08-13 MED ORDER — METOPROLOL TARTRATE 100 MG PO TABS: 100.0000 mg | ORAL_TABLET | Freq: Two times a day (BID) | ORAL | 2 refills | Status: DC

## 2017-08-13 MED ORDER — AMLODIPINE BESYLATE 5 MG PO TABS: 5.0000 mg | ORAL_TABLET | Freq: Every day | ORAL | 2 refills | Status: DC

## 2017-08-13 MED ORDER — ALBUTEROL SULFATE HFA 108 (90 BASE) MCG/ACT IN AERS: 2.0000 | INHALATION_SPRAY | Freq: Four times a day (QID) | RESPIRATORY_TRACT | 1 refills | Status: DC | PRN

## 2017-08-17 ENCOUNTER — Ambulatory Visit: Payer: Self-pay | Admitting: Physician Assistant

## 2017-08-18 ENCOUNTER — Other Ambulatory Visit: Payer: Self-pay | Admitting: Physician Assistant

## 2017-08-18 DIAGNOSIS — IMO0002 Reserved for concepts with insufficient information to code with codable children: Secondary | ICD-10-CM

## 2017-08-18 DIAGNOSIS — E785 Hyperlipidemia, unspecified: Secondary | ICD-10-CM

## 2017-08-18 DIAGNOSIS — Z794 Long term (current) use of insulin: Principal | ICD-10-CM

## 2017-08-18 DIAGNOSIS — E118 Type 2 diabetes mellitus with unspecified complications: Principal | ICD-10-CM

## 2017-08-18 DIAGNOSIS — I10 Essential (primary) hypertension: Secondary | ICD-10-CM

## 2017-08-18 DIAGNOSIS — E1165 Type 2 diabetes mellitus with hyperglycemia: Secondary | ICD-10-CM

## 2017-08-19 ENCOUNTER — Other Ambulatory Visit: Payer: Self-pay | Admitting: Physician Assistant

## 2017-08-26 ENCOUNTER — Ambulatory Visit: Payer: Self-pay | Admitting: Physician Assistant

## 2017-08-31 ENCOUNTER — Ambulatory Visit: Payer: Self-pay | Admitting: Physician Assistant

## 2017-09-14 ENCOUNTER — Other Ambulatory Visit (HOSPITAL_COMMUNITY)
Admission: RE | Admit: 2017-09-14 | Discharge: 2017-09-14 | Disposition: A | Discharge status: Home / Self Care | Payer: Self-pay | Source: Ambulatory Visit | Attending: Physician Assistant | Admitting: Physician Assistant

## 2017-09-14 DIAGNOSIS — E1165 Type 2 diabetes mellitus with hyperglycemia: Secondary | ICD-10-CM | POA: Insufficient documentation

## 2017-09-14 DIAGNOSIS — E118 Type 2 diabetes mellitus with unspecified complications: Secondary | ICD-10-CM | POA: Insufficient documentation

## 2017-09-14 DIAGNOSIS — Z794 Long term (current) use of insulin: Secondary | ICD-10-CM | POA: Insufficient documentation

## 2017-09-14 DIAGNOSIS — E785 Hyperlipidemia, unspecified: Secondary | ICD-10-CM | POA: Insufficient documentation

## 2017-09-14 DIAGNOSIS — IMO0002 Reserved for concepts with insufficient information to code with codable children: Secondary | ICD-10-CM

## 2017-09-14 DIAGNOSIS — I10 Essential (primary) hypertension: Secondary | ICD-10-CM | POA: Insufficient documentation

## 2017-09-14 LAB — LIPID PANEL
CHOL/HDL RATIO: 7.1 ratio
Cholesterol: 150 mg/dL (ref 0–200)
HDL: 21 mg/dL — ABNORMAL LOW (ref >40)
LDL CALC: 90 mg/dL (ref 0–99)
Triglycerides: 195 mg/dL — ABNORMAL HIGH (ref <150)
VLDL: 39 mg/dL (ref 0–40)

## 2017-09-14 LAB — COMPREHENSIVE METABOLIC PANEL
ALBUMIN: 3.9 g/dL (ref 3.5–5.0)
ALT: 40 U/L (ref 17–63)
AST: 26 U/L (ref 15–41)
Alkaline Phosphatase: 58 U/L (ref 38–126)
Anion gap: 7 (ref 5–15)
BUN: 7 mg/dL (ref 6–20)
CHLORIDE: 100 mmol/L — AB (ref 101–111)
CO2: 27 mmol/L (ref 22–32)
CREATININE: 0.66 mg/dL (ref 0.61–1.24)
Calcium: 9.1 mg/dL (ref 8.9–10.3)
GFR calc Af Amer: >60 mL/min (ref >60)
GFR calc non Af Amer: >60 mL/min (ref >60)
Glucose, Bld: 177 mg/dL — ABNORMAL HIGH (ref 65–99)
POTASSIUM: 3.8 mmol/L (ref 3.5–5.1)
SODIUM: 134 mmol/L — AB (ref 135–145)
Total Bilirubin: 0.5 mg/dL (ref 0.3–1.2)
Total Protein: 7 g/dL (ref 6.5–8.1)

## 2017-09-15 ENCOUNTER — Encounter: Payer: Self-pay | Admitting: Physician Assistant

## 2017-09-15 ENCOUNTER — Ambulatory Visit: Payer: Self-pay | Admitting: Physician Assistant

## 2017-09-15 VITALS — BP 110/72 | HR 76 | Temp 97.7°F | Wt 288.2 lb

## 2017-09-15 DIAGNOSIS — E785 Hyperlipidemia, unspecified: Secondary | ICD-10-CM

## 2017-09-15 DIAGNOSIS — E118 Type 2 diabetes mellitus with unspecified complications: Secondary | ICD-10-CM

## 2017-09-15 DIAGNOSIS — F17219 Nicotine dependence, cigarettes, with unspecified nicotine-induced disorders: Secondary | ICD-10-CM

## 2017-09-15 DIAGNOSIS — I10 Essential (primary) hypertension: Secondary | ICD-10-CM

## 2017-09-15 DIAGNOSIS — Z794 Long term (current) use of insulin: Principal | ICD-10-CM

## 2017-09-15 DIAGNOSIS — E669 Obesity, unspecified: Secondary | ICD-10-CM

## 2017-09-15 LAB — HEMOGLOBIN A1C
HEMOGLOBIN A1C: 7.2 % — AB (ref 4.8–5.6)
Mean Plasma Glucose: 160 mg/dL

## 2017-09-15 LAB — MICROALBUMIN, URINE: Microalb, Ur: 100.8 ug/mL — ABNORMAL HIGH

## 2017-09-15 NOTE — Progress Notes (Signed)
BP 110/72 (BP Location: Left Arm, Patient Position: Sitting, Cuff Size: Normal)   Pulse 76   Temp 97.7 F (36.5 C)   Wt 288 lb 4 oz (130.7 kg)   SpO2 98%   BMI 39.09 kg/m    Subjective:    Patient ID: Edwin Ford, male    DOB: Nov 27, 1980, 36 y.o.   MRN: [id number]  HPI: Edwin Ford is a 36 y.o. male presenting on 09/15/2017 for Diabetes; Hypertension; Hyperlipidemia; and Fall (2 weeks ago. pt states he hurt his back but is healing now)   HPI   Chief Complaint  Patient presents with  . Diabetes  . Hypertension  . Hyperlipidemia  . Fall    2 weeks ago. pt states he hurt his back but is healing now    Pt is doing well.   He fell when he tripped over concrete block in parking lot.  He says he is doing better and only has mild discomfort now.    Relevant past medical, surgical, family and social history reviewed and updated as indicated. Interim medical history since our last visit reviewed. Allergies and medications reviewed and updated.   Current Outpatient Prescriptions:  .  albuterol (PROVENTIL HFA;VENTOLIN HFA) 108 (90 Base) MCG/ACT inhaler, Inhale 2 puffs into the lungs every 6 (six) hours as needed for wheezing or shortness of breath., Disp: 3 Inhaler, Rfl: 1 .  ALPRAZolam (XANAX) 1 MG tablet, Take 1 tablet (1 mg total) by mouth 3 (three) times daily as needed for anxiety. (Patient taking differently: Take 2 mg by mouth 3 (three) times daily as needed for anxiety. ), Disp: 12 tablet, Rfl: 0 .  amLODipine (NORVASC) 5 MG tablet, Take 1 tablet (5 mg total) by mouth daily., Disp: 90 tablet, Rfl: 2 .  atorvastatin (LIPITOR) 20 MG tablet, Take 1 tablet (20 mg total) by mouth daily., Disp: 90 tablet, Rfl: 2 .  clonazePAM (KLONOPIN) 1 MG tablet, Take 1 mg by mouth 3 (three) times daily as needed for anxiety., Disp: , Rfl:  .  insulin glargine (LANTUS) 100 UNIT/ML injection, Inject 35 Units into the skin at bedtime. Reported on 04/23/2016, Disp: , Rfl:  .  lisinopril  (PRINIVIL,ZESTRIL) 20 MG tablet, Take 1 tablet (20 mg total) by mouth daily., Disp: 90 tablet, Rfl: 2 .  metFORMIN (GLUCOPHAGE) 1000 MG tablet, Take 1 tablet (1,000 mg total) by mouth 2 (two) times daily with a meal., Disp: 180 tablet, Rfl: 1 .  metoprolol tartrate (LOPRESSOR) 100 MG tablet, Take 1 tablet (100 mg total) by mouth 2 (two) times daily., Disp: 180 tablet, Rfl: 2 .  omeprazole (PRILOSEC) 20 MG capsule, Take 1 capsule (20 mg total) by mouth 2 (two) times daily., Disp: 180 capsule, Rfl: 1 .  sertraline (ZOLOFT) 100 MG tablet, Take 2 tablets (200 mg total) by mouth daily., Disp: 60 tablet, Rfl: 0 .  sitaGLIPtin (JANUVIA) 100 MG tablet, Take 1 tablet (100 mg total) by mouth daily., Disp: 90 tablet, Rfl: 1 .  Omega-3 Fatty Acids (FISH OIL) 1000 MG CAPS, Take by mouth. Reported on 04/23/2016, Disp: , Rfl:    Review of Systems  Constitutional: Negative for appetite change, chills, diaphoresis, fatigue, fever and unexpected weight change.  HENT: Positive for congestion. Negative for dental problem, drooling, ear pain, facial swelling, hearing loss, mouth sores, sneezing, sore throat, trouble swallowing and voice change.   Eyes: Positive for visual disturbance. Negative for pain, discharge, redness and itching.  Respiratory: Negative for cough, choking, shortness  of breath and wheezing.   Cardiovascular: Negative for chest pain, palpitations and leg swelling.  Gastrointestinal: Negative for abdominal pain, blood in stool, constipation, diarrhea and vomiting.  Endocrine: Negative for cold intolerance, heat intolerance and polydipsia.  Genitourinary: Negative for decreased urine volume, dysuria and hematuria.  Musculoskeletal: Negative for arthralgias, back pain and gait problem.  Skin: Negative for rash.  Allergic/Immunologic: Positive for environmental allergies.  Neurological: Negative for seizures, syncope, light-headedness and headaches.  Hematological: Negative for adenopathy.   Psychiatric/Behavioral: Negative for agitation, dysphoric mood and suicidal ideas. The patient is not nervous/anxious.     Per HPI unless specifically indicated above     Objective:    BP 110/72 (BP Location: Left Arm, Patient Position: Sitting, Cuff Size: Normal)   Pulse 76   Temp 97.7 F (36.5 C)   Wt 288 lb 4 oz (130.7 kg)   SpO2 98%   BMI 39.09 kg/m   Wt Readings from Last 3 Encounters:  09/15/17 288 lb 4 oz (130.7 kg)  05/27/17 292 lb 4 oz (132.6 kg)  02/25/17 299 lb 12 oz (136 kg)    Physical Exam  Constitutional: He is oriented to person, place, and time. He appears well-developed and well-nourished.  HENT:  Head: Normocephalic and atraumatic.  Neck: Neck supple.  Cardiovascular: Normal rate and regular rhythm.   Pulmonary/Chest: Effort normal and breath sounds normal. He has no wheezes.  Abdominal: Soft. Bowel sounds are normal. There is no hepatosplenomegaly. There is no tenderness.  Musculoskeletal: He exhibits no edema.  Lymphadenopathy:    He has no cervical adenopathy.  Neurological: He is alert and oriented to person, place, and time.  Skin: Skin is warm and dry.  Psychiatric: He has a normal mood and affect. His behavior is normal.  Vitals reviewed.   Results for orders placed or performed during the hospital encounter of 09/14/17  HgB A1c  Result Value Ref Range   Hgb A1c MFr Bld 7.2 (H) 4.8 - 5.6 %   Mean Plasma Glucose 160 mg/dL  Comprehensive metabolic panel  Result Value Ref Range   Sodium 134 (L) 135 - 145 mmol/L   Potassium 3.8 3.5 - 5.1 mmol/L   Chloride 100 (L) 101 - 111 mmol/L   CO2 27 22 - 32 mmol/L   Glucose, Bld 177 (H) 65 - 99 mg/dL   BUN 7 6 - 20 mg/dL   Creatinine, Ser 1.610.66 0.61 - 1.24 mg/dL   Calcium 9.1 8.9 - 09.610.3 mg/dL   Total Protein 7.0 6.5 - 8.1 g/dL   Albumin 3.9 3.5 - 5.0 g/dL   AST 26 15 - 41 U/L   ALT 40 17 - 63 U/L   Alkaline Phosphatase 58 38 - 126 U/L   Total Bilirubin 0.5 0.3 - 1.2 mg/dL   GFR calc non Af Amer  >60 >60 mL/min   GFR calc Af Amer >60 >60 mL/min   Anion gap 7 5 - 15  Lipid Profile  Result Value Ref Range   Cholesterol 150 0 - 200 mg/dL   Triglycerides 045195 (H) <150 mg/dL   HDL 21 (L) >40>40 mg/dL   Total CHOL/HDL Ratio 7.1 RATIO   VLDL 39 0 - 40 mg/dL   LDL Cholesterol 90 0 - 99 mg/dL      Assessment & Plan:    Encounter Diagnoses  Name Primary?  . Type 2 diabetes mellitus with complication, with long-term current use of insulin (HCC) Yes  . Essential hypertension, benign   . Hyperlipidemia,  unspecified hyperlipidemia type   . Cigarette nicotine dependence with nicotine-induced disorder   . Obesity, unspecified classification, unspecified obesity type, unspecified whether serious comorbidity present     -reviewed labs with pt -pt will continue current medications -encouraged pt to continue weight loss through diet and exercise -encouraged pt to get back on his fish oil -counseled smoking cessation -pt to follow up 3 months. RTO sooner prn

## 2017-11-23 ENCOUNTER — Other Ambulatory Visit: Payer: Self-pay | Admitting: Physician Assistant

## 2017-12-16 ENCOUNTER — Ambulatory Visit: Payer: Self-pay | Admitting: Physician Assistant

## 2017-12-16 ENCOUNTER — Encounter: Payer: Self-pay | Admitting: Physician Assistant

## 2017-12-16 ENCOUNTER — Other Ambulatory Visit (HOSPITAL_COMMUNITY)
Admission: RE | Admit: 2017-12-16 | Discharge: 2017-12-16 | Disposition: A | Discharge status: Home / Self Care | Payer: Self-pay | Source: Ambulatory Visit | Attending: Physician Assistant | Admitting: Physician Assistant

## 2017-12-16 VITALS — BP 142/90 | HR 68 | Temp 97.1°F | Ht 72.0 in | Wt 295.8 lb

## 2017-12-16 DIAGNOSIS — E118 Type 2 diabetes mellitus with unspecified complications: Secondary | ICD-10-CM | POA: Insufficient documentation

## 2017-12-16 DIAGNOSIS — E785 Hyperlipidemia, unspecified: Secondary | ICD-10-CM

## 2017-12-16 DIAGNOSIS — K219 Gastro-esophageal reflux disease without esophagitis: Secondary | ICD-10-CM

## 2017-12-16 DIAGNOSIS — I10 Essential (primary) hypertension: Secondary | ICD-10-CM

## 2017-12-16 DIAGNOSIS — Z794 Long term (current) use of insulin: Principal | ICD-10-CM

## 2017-12-16 DIAGNOSIS — E669 Obesity, unspecified: Secondary | ICD-10-CM

## 2017-12-16 DIAGNOSIS — F17219 Nicotine dependence, cigarettes, with unspecified nicotine-induced disorders: Secondary | ICD-10-CM

## 2017-12-16 LAB — COMPREHENSIVE METABOLIC PANEL
ALBUMIN: 3.9 g/dL (ref 3.5–5.0)
ALK PHOS: 67 U/L (ref 38–126)
ALT: 53 U/L (ref 17–63)
ANION GAP: 15 (ref 5–15)
AST: 31 U/L (ref 15–41)
BUN: 11 mg/dL (ref 6–20)
CALCIUM: 9.3 mg/dL (ref 8.9–10.3)
CHLORIDE: 104 mmol/L (ref 101–111)
CO2: 17 mmol/L — AB (ref 22–32)
Creatinine, Ser: 0.82 mg/dL (ref 0.61–1.24)
GFR calc Af Amer: >60 mL/min (ref >60)
GFR calc non Af Amer: >60 mL/min (ref >60)
Glucose, Bld: 142 mg/dL — ABNORMAL HIGH (ref 65–99)
Potassium: 4.2 mmol/L (ref 3.5–5.1)
SODIUM: 136 mmol/L (ref 135–145)
Total Bilirubin: 0.8 mg/dL (ref 0.3–1.2)
Total Protein: 7.2 g/dL (ref 6.5–8.1)

## 2017-12-16 LAB — LIPID PANEL
CHOLESTEROL: 171 mg/dL (ref 0–200)
HDL: 26 mg/dL — ABNORMAL LOW (ref >40)
LDL Cholesterol: 105 mg/dL — ABNORMAL HIGH (ref 0–99)
TRIGLYCERIDES: 201 mg/dL — AB (ref <150)
Total CHOL/HDL Ratio: 6.6 RATIO
VLDL: 40 mg/dL (ref 0–40)

## 2017-12-16 LAB — HEMOGLOBIN A1C
Hgb A1c MFr Bld: 7.5 % — ABNORMAL HIGH (ref 4.8–5.6)
Mean Plasma Glucose: 168.55 mg/dL

## 2017-12-16 NOTE — Progress Notes (Signed)
BP (!) 142/90 (BP Location: Right Arm, Patient Position: Sitting, Cuff Size: Large)   Pulse 68   Temp (!) 97.1 F (36.2 C)   Ht 6' (1.829 m)   Wt 295 lb 12 oz (134.2 kg)   SpO2 98%   BMI 40.11 kg/m    Subjective:    Patient ID: Edwin Ford, male    DOB: 1980/12/30, 37 y.o.   MRN: [id number]  HPI: Edwin Ford is a 37 y.o. male presenting on 12/16/2017 for Diabetes; Hyperlipidemia; and Hypertension   HPI   Pt was out of his meds for just over a week.  He got back on them yesterday.     Pt did not get his blood drawn today.    Pt is doing well today  Relevant past medical, surgical, family and social history reviewed and updated as indicated. Interim medical history since our last visit reviewed. Allergies and medications reviewed and updated.   Current Outpatient Medications:  .  albuterol (PROVENTIL HFA;VENTOLIN HFA) 108 (90 Base) MCG/ACT inhaler, Inhale 2 puffs into the lungs every 6 (six) hours as needed for wheezing or shortness of breath., Disp: 3 Inhaler, Rfl: 1 .  ALPRAZolam (XANAX) 1 MG tablet, Take 1 tablet (1 mg total) by mouth 3 (three) times daily as needed for anxiety. (Patient taking differently: Take 2 mg by mouth 3 (three) times daily as needed for anxiety. ), Disp: 12 tablet, Rfl: 0 .  amLODipine (NORVASC) 5 MG tablet, Take 1 tablet (5 mg total) by mouth daily., Disp: 90 tablet, Rfl: 2 .  atorvastatin (LIPITOR) 20 MG tablet, Take 1 tablet (20 mg total) by mouth daily., Disp: 90 tablet, Rfl: 2 .  clonazePAM (KLONOPIN) 1 MG tablet, Take 1 mg by mouth 3 (three) times daily as needed for anxiety., Disp: , Rfl:  .  insulin glargine (LANTUS) 100 UNIT/ML injection, Inject 35 Units into the skin at bedtime. Reported on 04/23/2016, Disp: , Rfl:  .  JANUVIA 100 MG tablet, TAKE 1 Tablet BY MOUTH ONCE DAILY, Disp: 90 tablet, Rfl: 2 .  lisinopril (PRINIVIL,ZESTRIL) 20 MG tablet, Take 1 tablet (20 mg total) by mouth daily., Disp: 90 tablet, Rfl: 2 .  metFORMIN  (GLUCOPHAGE) 1000 MG tablet, Take 1 tablet (1,000 mg total) by mouth 2 (two) times daily with a meal., Disp: 180 tablet, Rfl: 1 .  metoprolol tartrate (LOPRESSOR) 100 MG tablet, Take 1 tablet (100 mg total) by mouth 2 (two) times daily., Disp: 180 tablet, Rfl: 2 .  omeprazole (PRILOSEC) 20 MG capsule, Take 1 capsule (20 mg total) by mouth 2 (two) times daily., Disp: 180 capsule, Rfl: 1 .  sertraline (ZOLOFT) 100 MG tablet, Take 2 tablets (200 mg total) by mouth daily., Disp: 60 tablet, Rfl: 0 .  Omega-3 Fatty Acids (FISH OIL) 1000 MG CAPS, Take by mouth. Reported on 04/23/2016, Disp: , Rfl:    Review of Systems  Constitutional: Negative for appetite change, chills, diaphoresis, fatigue, fever and unexpected weight change.  HENT: Negative for congestion, dental problem, drooling, ear pain, facial swelling, hearing loss, mouth sores, sneezing, sore throat, trouble swallowing and voice change.   Eyes: Negative for pain, discharge, redness, itching and visual disturbance.  Respiratory: Negative for cough, choking, shortness of breath and wheezing.   Cardiovascular: Negative for chest pain, palpitations and leg swelling.  Gastrointestinal: Negative for abdominal pain, blood in stool, constipation, diarrhea and vomiting.  Endocrine: Negative for cold intolerance, heat intolerance and polydipsia.  Genitourinary: Negative for decreased urine  volume, dysuria and hematuria.  Musculoskeletal: Negative for arthralgias, back pain and gait problem.  Skin: Negative for rash.  Allergic/Immunologic: Negative for environmental allergies.  Neurological: Negative for seizures, syncope, light-headedness and headaches.  Hematological: Negative for adenopathy.  Psychiatric/Behavioral: Negative for agitation, dysphoric mood and suicidal ideas. The patient is not nervous/anxious.     Per HPI unless specifically indicated above     Objective:    BP (!) 142/90 (BP Location: Right Arm, Patient Position: Sitting, Cuff  Size: Large)   Pulse 68   Temp (!) 97.1 F (36.2 C)   Ht 6' (1.829 m)   Wt 295 lb 12 oz (134.2 kg)   SpO2 98%   BMI 40.11 kg/m   Wt Readings from Last 3 Encounters:  12/16/17 295 lb 12 oz (134.2 kg)  09/15/17 288 lb 4 oz (130.7 kg)  05/27/17 292 lb 4 oz (132.6 kg)    Physical Exam  Constitutional: He is oriented to person, place, and time. He appears well-developed and well-nourished.  HENT:  Head: Normocephalic and atraumatic.  Neck: Neck supple.  Cardiovascular: Normal rate and regular rhythm.  Pulmonary/Chest: Effort normal and breath sounds normal. He has no wheezes.  Abdominal: Soft. Bowel sounds are normal. There is no hepatosplenomegaly. There is no tenderness.  Musculoskeletal: He exhibits no edema.  Lymphadenopathy:    He has no cervical adenopathy.  Neurological: He is alert and oriented to person, place, and time.  Skin: Skin is warm and dry.  Psychiatric: He has a normal mood and affect. His behavior is normal.  Vitals reviewed.       Assessment & Plan:   Encounter Diagnoses  Name Primary?  . Type 2 diabetes mellitus with complication, with long-term current use of insulin (HCC) Yes  . Essential hypertension, benign   . Hyperlipidemia, unspecified hyperlipidemia type   . Cigarette nicotine dependence with nicotine-induced disorder   . Obesity, unspecified classification, unspecified obesity type, unspecified whether serious comorbidity present   . Gastroesophageal reflux disease, esophagitis presence not specified     -pt to get labs done.  Will call with results -pt [facility] -pt encouraged to avoid running out of his medications -no changes to medications today -pt to follow up 3 months.  RTO sooner prn

## 2018-03-08 ENCOUNTER — Other Ambulatory Visit: Payer: Self-pay | Admitting: Physician Assistant

## 2018-03-16 ENCOUNTER — Ambulatory Visit: Payer: Self-pay | Admitting: Physician Assistant

## 2018-03-16 ENCOUNTER — Encounter: Payer: Self-pay | Admitting: Physician Assistant

## 2018-03-16 VITALS — BP 132/79 | HR 72 | Ht 72.0 in | Wt 288.0 lb

## 2018-03-16 DIAGNOSIS — F17219 Nicotine dependence, cigarettes, with unspecified nicotine-induced disorders: Secondary | ICD-10-CM

## 2018-03-16 DIAGNOSIS — E669 Obesity, unspecified: Secondary | ICD-10-CM

## 2018-03-16 DIAGNOSIS — F329 Major depressive disorder, single episode, unspecified: Secondary | ICD-10-CM

## 2018-03-16 DIAGNOSIS — E118 Type 2 diabetes mellitus with unspecified complications: Secondary | ICD-10-CM

## 2018-03-16 DIAGNOSIS — Z794 Long term (current) use of insulin: Principal | ICD-10-CM

## 2018-03-16 DIAGNOSIS — F32A Depression, unspecified: Secondary | ICD-10-CM

## 2018-03-16 DIAGNOSIS — I10 Essential (primary) hypertension: Secondary | ICD-10-CM

## 2018-03-16 DIAGNOSIS — E785 Hyperlipidemia, unspecified: Secondary | ICD-10-CM

## 2018-03-16 DIAGNOSIS — L723 Sebaceous cyst: Secondary | ICD-10-CM

## 2018-03-16 NOTE — Progress Notes (Signed)
BP 132/79 (BP Location: Right Arm, Patient Position: Sitting, Cuff Size: Large)   Pulse 72   Ht 6' (1.829 m)   Wt 288 lb (130.6 kg)   SpO2 98%   BMI 39.06 kg/m    Subjective:    Patient ID: Edwin Ford, male    DOB: 02-24-1981, 37 y.o.   MRN: [id number]  HPI: Edwin Ford is a 37 y.o. male presenting on 03/16/2018 for Diabetes; Hypertension; and Hyperlipidemia   HPI   Pt is seeing dr Sharl Ma for [facility].  He went in March.   Pt still smoking.   Pt started with CP about 2 week ago after clearing a lot-  He had done a bunch of raking.  It is improving with IBU.  Pt states constant aching worse with movement.  It has lessened over the past 2 weeks.  Pt states usual stress but nothing new.  Pt asks if bump on leg is a wart.  He has picked at it in the past and it returned.   Relevant past medical, surgical, family and social history reviewed and updated as indicated. Interim medical history since our last visit reviewed. Allergies and medications reviewed and updated.   Current Outpatient Medications:  .  ALPRAZolam (XANAX) 1 MG tablet, Take 1 tablet (1 mg total) by mouth 3 (three) times daily as needed for anxiety. (Patient taking differently: Take 2 mg by mouth 3 (three) times daily as needed for anxiety. ), Disp: 12 tablet, Rfl: 0 .  amLODipine (NORVASC) 5 MG tablet, Take 1 tablet (5 mg total) by mouth daily., Disp: 90 tablet, Rfl: 2 .  atorvastatin (LIPITOR) 20 MG tablet, Take 1 tablet (20 mg total) by mouth daily., Disp: 90 tablet, Rfl: 2 .  clonazePAM (KLONOPIN) 1 MG tablet, Take 1 mg by mouth 3 (three) times daily as needed for anxiety., Disp: , Rfl:  .  insulin glargine (LANTUS) 100 UNIT/ML injection, Inject 35 Units into the skin at bedtime. Reported on 04/23/2016, Disp: , Rfl:  .  JANUVIA 100 MG tablet, TAKE 1 Tablet BY MOUTH ONCE DAILY, Disp: 90 tablet, Rfl: 2 .  lisinopril (PRINIVIL,ZESTRIL) 20 MG tablet, Take 1 tablet (20 mg total) by mouth daily., Disp: 90 tablet, Rfl:  2 .  metFORMIN (GLUCOPHAGE) 1000 MG tablet, TAKE 1 Tablet  BY MOUTH TWICE DAILY WITH MEALS, Disp: 180 tablet, Rfl: 1 .  metoprolol tartrate (LOPRESSOR) 100 MG tablet, Take 1 tablet (100 mg total) by mouth 2 (two) times daily., Disp: 180 tablet, Rfl: 2 .  omeprazole (PRILOSEC) 20 MG capsule, TAKE 1 Capsule  BY MOUTH TWICE DAILY, Disp: 180 capsule, Rfl: 1 .  PROVENTIL HFA 108 (90 Base) MCG/ACT inhaler, INHALE 2 PUFFS EVERY 6 HOURS AS NEEDED FOR WHEEZING OR SHORTNESS OF BREATH, Disp: 20.1 g, Rfl: 1 .  sertraline (ZOLOFT) 100 MG tablet, Take 2 tablets (200 mg total) by mouth daily., Disp: 60 tablet, Rfl: 0   Review of Systems  Constitutional: Negative for appetite change, chills, diaphoresis, fatigue, fever and unexpected weight change.  HENT: Positive for dental problem. Negative for congestion, drooling, ear pain, facial swelling, hearing loss, mouth sores, sneezing, sore throat, trouble swallowing and voice change.   Eyes: Negative for pain, discharge, redness, itching and visual disturbance.  Respiratory: Negative for cough, choking, shortness of breath and wheezing.   Cardiovascular: Positive for chest pain. Negative for palpitations and leg swelling.  Gastrointestinal: Negative for abdominal pain, blood in stool, constipation, diarrhea and vomiting.  Endocrine: Negative  for cold intolerance, heat intolerance and polydipsia.  Genitourinary: Negative for decreased urine volume, dysuria and hematuria.  Musculoskeletal: Positive for arthralgias. Negative for back pain and gait problem.  Skin: Negative for rash.  Allergic/Immunologic: Positive for environmental allergies.  Neurological: Negative for seizures, syncope, light-headedness and headaches.  Hematological: Negative for adenopathy.  Psychiatric/Behavioral: Negative for agitation, dysphoric mood and suicidal ideas. The patient is not nervous/anxious.     Per HPI unless specifically indicated above     Objective:    BP 132/79 (BP  Location: Right Arm, Patient Position: Sitting, Cuff Size: Large)   Pulse 72   Ht 6' (1.829 m)   Wt 288 lb (130.6 kg)   SpO2 98%   BMI 39.06 kg/m   Wt Readings from Last 3 Encounters:  03/16/18 288 lb (130.6 kg)  12/16/17 295 lb 12 oz (134.2 kg)  09/15/17 288 lb 4 oz (130.7 kg)    Physical Exam  Constitutional: He is oriented to person, place, and time. He appears well-developed and well-nourished.  HENT:  Head: Normocephalic and atraumatic.  Neck: Neck supple.  Cardiovascular: Normal rate and regular rhythm.  Pulmonary/Chest: Effort normal and breath sounds normal. He has no wheezes.  Abdominal: Soft. Bowel sounds are normal. There is no hepatosplenomegaly. There is no tenderness.  Musculoskeletal: He exhibits no edema.       Left lower leg: He exhibits no tenderness, no swelling and no edema.  Approximately 5mm bump left lower leg on medial calf which is white and firm. Not tender. Not red.  Not draining.   Lymphadenopathy:    He has no cervical adenopathy.  Neurological: He is alert and oriented to person, place, and time.  Skin: Skin is warm and dry.  Psychiatric: He has a normal mood and affect. His behavior is normal.  Vitals reviewed.       Assessment & Plan:   Encounter Diagnoses  Name Primary?  . Type 2 diabetes mellitus with complication, with long-term current use of insulin (HCC) Yes  . Essential hypertension, benign   . Hyperlipidemia, unspecified hyperlipidemia type   . Cigarette nicotine dependence with nicotine-induced disorder   . Obesity, unspecified classification, unspecified obesity type, unspecified whether serious comorbidity present   . Depression, unspecified depression type   . Sebaceous cyst      -pt to Get fasting labs- will call with results -discussed with pt that bump on leg consistent with sebaceous cyst and encouraged him to avoid picking at it and not to mash it.  He is to RTO if it becomes red or he has other problems with it -pt to  continue current medications -pt to follow up 3 months.  RTO sooner prn

## 2018-04-01 ENCOUNTER — Other Ambulatory Visit (HOSPITAL_COMMUNITY)
Admission: RE | Admit: 2018-04-01 | Discharge: 2018-04-01 | Disposition: A | Discharge status: Home / Self Care | Payer: Self-pay | Source: Ambulatory Visit | Attending: Physician Assistant | Admitting: Physician Assistant

## 2018-04-01 DIAGNOSIS — E118 Type 2 diabetes mellitus with unspecified complications: Secondary | ICD-10-CM | POA: Insufficient documentation

## 2018-04-01 DIAGNOSIS — Z794 Long term (current) use of insulin: Secondary | ICD-10-CM | POA: Insufficient documentation

## 2018-04-01 DIAGNOSIS — E785 Hyperlipidemia, unspecified: Secondary | ICD-10-CM | POA: Insufficient documentation

## 2018-04-01 DIAGNOSIS — I10 Essential (primary) hypertension: Secondary | ICD-10-CM | POA: Insufficient documentation

## 2018-04-01 LAB — COMPREHENSIVE METABOLIC PANEL
ALBUMIN: 3.9 g/dL (ref 3.5–5.0)
ALT: 58 U/L (ref 17–63)
ANION GAP: 10 (ref 5–15)
AST: 35 U/L (ref 15–41)
Alkaline Phosphatase: 71 U/L (ref 38–126)
BILIRUBIN TOTAL: 0.8 mg/dL (ref 0.3–1.2)
BUN: 12 mg/dL (ref 6–20)
CHLORIDE: 101 mmol/L (ref 101–111)
CO2: 25 mmol/L (ref 22–32)
Calcium: 9.4 mg/dL (ref 8.9–10.3)
Creatinine, Ser: 0.6 mg/dL — ABNORMAL LOW (ref 0.61–1.24)
GFR calc Af Amer: >60 mL/min (ref >60)
GFR calc non Af Amer: >60 mL/min (ref >60)
Glucose, Bld: 155 mg/dL — ABNORMAL HIGH (ref 65–99)
POTASSIUM: 3.3 mmol/L — AB (ref 3.5–5.1)
Sodium: 136 mmol/L (ref 135–145)
TOTAL PROTEIN: 7 g/dL (ref 6.5–8.1)

## 2018-04-01 LAB — LIPID PANEL
CHOL/HDL RATIO: 6.2 ratio
CHOLESTEROL: 162 mg/dL (ref 0–200)
HDL: 26 mg/dL — ABNORMAL LOW (ref >40)
LDL CALC: 85 mg/dL (ref 0–99)
Triglycerides: 254 mg/dL — ABNORMAL HIGH (ref <150)
VLDL: 51 mg/dL — AB (ref 0–40)

## 2018-04-02 LAB — HEMOGLOBIN A1C
Hgb A1c MFr Bld: 8.7 % — ABNORMAL HIGH (ref 4.8–5.6)
Mean Plasma Glucose: 203 mg/dL

## 2018-04-05 ENCOUNTER — Other Ambulatory Visit: Payer: Self-pay | Admitting: Physician Assistant

## 2018-04-05 DIAGNOSIS — I10 Essential (primary) hypertension: Secondary | ICD-10-CM

## 2018-04-05 DIAGNOSIS — IMO0002 Reserved for concepts with insufficient information to code with codable children: Secondary | ICD-10-CM

## 2018-04-05 DIAGNOSIS — Z794 Long term (current) use of insulin: Principal | ICD-10-CM

## 2018-04-05 DIAGNOSIS — E785 Hyperlipidemia, unspecified: Secondary | ICD-10-CM

## 2018-04-05 DIAGNOSIS — E1165 Type 2 diabetes mellitus with hyperglycemia: Secondary | ICD-10-CM

## 2018-04-05 DIAGNOSIS — E118 Type 2 diabetes mellitus with unspecified complications: Secondary | ICD-10-CM

## 2018-04-19 ENCOUNTER — Telehealth: Payer: Self-pay | Admitting: Student

## 2018-04-19 NOTE — Telephone Encounter (Signed)
LPN called pt back regarding swelling. Pt was offered an appointment to be seen on tomorrow 04-20-18 at 11:15 or he could keep his legs raised and call back to be seen if swelling hadn't resolved.   Pt states he will keep his legs raised and if no relief will call back Thursday Morning to schedule appointment

## 2018-05-24 ENCOUNTER — Other Ambulatory Visit: Payer: Self-pay | Admitting: Physician Assistant

## 2018-06-15 ENCOUNTER — Ambulatory Visit: Payer: Self-pay | Admitting: Physician Assistant

## 2018-06-22 ENCOUNTER — Other Ambulatory Visit: Payer: Self-pay | Admitting: Physician Assistant

## 2018-06-22 MED ORDER — METFORMIN HCL 1000 MG PO TABS: 1000.0000 mg | ORAL_TABLET | Freq: Two times a day (BID) | ORAL | 0 refills | Status: DC

## 2018-06-22 MED ORDER — LISINOPRIL 20 MG PO TABS: 20.0000 mg | ORAL_TABLET | Freq: Every day | ORAL | 0 refills | Status: DC

## 2018-06-22 MED ORDER — SITAGLIPTIN PHOSPHATE 100 MG PO TABS: 100.0000 mg | ORAL_TABLET | Freq: Every day | ORAL | 0 refills | Status: DC

## 2018-06-22 MED ORDER — AMLODIPINE BESYLATE 5 MG PO TABS: 5.0000 mg | ORAL_TABLET | Freq: Every day | ORAL | 0 refills | Status: DC

## 2018-06-22 MED ORDER — METOPROLOL TARTRATE 100 MG PO TABS: 100.0000 mg | ORAL_TABLET | Freq: Two times a day (BID) | ORAL | 0 refills | Status: DC

## 2018-06-22 MED ORDER — ATORVASTATIN CALCIUM 20 MG PO TABS: 20.0000 mg | ORAL_TABLET | Freq: Every day | ORAL | 0 refills | Status: DC

## 2018-07-12 ENCOUNTER — Ambulatory Visit: Payer: Self-pay | Admitting: Physician Assistant

## 2018-07-12 ENCOUNTER — Other Ambulatory Visit (HOSPITAL_COMMUNITY)
Admission: RE | Admit: 2018-07-12 | Discharge: 2018-07-12 | Disposition: A | Discharge status: Home / Self Care | Payer: Self-pay | Source: Ambulatory Visit | Attending: Physician Assistant | Admitting: Physician Assistant

## 2018-07-12 ENCOUNTER — Encounter: Payer: Self-pay | Admitting: Physician Assistant

## 2018-07-12 VITALS — BP 124/88 | HR 76 | Temp 97.0°F | Ht 72.0 in | Wt 286.0 lb

## 2018-07-12 DIAGNOSIS — F17219 Nicotine dependence, cigarettes, with unspecified nicotine-induced disorders: Secondary | ICD-10-CM

## 2018-07-12 DIAGNOSIS — IMO0002 Reserved for concepts with insufficient information to code with codable children: Secondary | ICD-10-CM

## 2018-07-12 DIAGNOSIS — E1165 Type 2 diabetes mellitus with hyperglycemia: Secondary | ICD-10-CM | POA: Insufficient documentation

## 2018-07-12 DIAGNOSIS — E785 Hyperlipidemia, unspecified: Secondary | ICD-10-CM | POA: Insufficient documentation

## 2018-07-12 DIAGNOSIS — Z794 Long term (current) use of insulin: Secondary | ICD-10-CM | POA: Insufficient documentation

## 2018-07-12 DIAGNOSIS — I10 Essential (primary) hypertension: Secondary | ICD-10-CM

## 2018-07-12 DIAGNOSIS — E669 Obesity, unspecified: Secondary | ICD-10-CM

## 2018-07-12 DIAGNOSIS — E118 Type 2 diabetes mellitus with unspecified complications: Secondary | ICD-10-CM | POA: Insufficient documentation

## 2018-07-12 LAB — COMPREHENSIVE METABOLIC PANEL
ALT: 60 U/L — ABNORMAL HIGH (ref 0–44)
AST: 41 U/L (ref 15–41)
Albumin: 3.9 g/dL (ref 3.5–5.0)
Alkaline Phosphatase: 64 U/L (ref 38–126)
Anion gap: 11 (ref 5–15)
BUN: 8 mg/dL (ref 6–20)
CHLORIDE: 101 mmol/L (ref 98–111)
CO2: 25 mmol/L (ref 22–32)
Calcium: 9.4 mg/dL (ref 8.9–10.3)
Creatinine, Ser: 0.72 mg/dL (ref 0.61–1.24)
Glucose, Bld: 195 mg/dL — ABNORMAL HIGH (ref 70–99)
POTASSIUM: 3.7 mmol/L (ref 3.5–5.1)
Sodium: 137 mmol/L (ref 135–145)
Total Bilirubin: 0.6 mg/dL (ref 0.3–1.2)
Total Protein: 7.1 g/dL (ref 6.5–8.1)

## 2018-07-12 LAB — LIPID PANEL
CHOL/HDL RATIO: 6.7 ratio
Cholesterol: 154 mg/dL (ref 0–200)
HDL: 23 mg/dL — ABNORMAL LOW (ref >40)
LDL Cholesterol: UNDETERMINED mg/dL (ref 0–99)
Triglycerides: 439 mg/dL — ABNORMAL HIGH (ref <150)
VLDL: UNDETERMINED mg/dL (ref 0–40)

## 2018-07-12 LAB — HEMOGLOBIN A1C
HEMOGLOBIN A1C: 8 % — AB (ref 4.8–5.6)
MEAN PLASMA GLUCOSE: 182.9 mg/dL

## 2018-07-12 NOTE — Progress Notes (Signed)
BP 124/88 (BP Location: Left Arm, Patient Position: Sitting, Cuff Size: Large)   Pulse 76   Temp (!) 97 F (36.1 C) (Other (Comment))   Ht 6' (1.829 m)   Wt 286 lb (129.7 kg)   SpO2 97%   BMI 38.79 kg/m    Subjective:    Patient ID: Edwin Ford, male    DOB: 1981/09/23, [age] y.o.   MRN: [id number]  HPI: Edwin Ford is a 37 y.o. male presenting on 07/12/2018 for Diabetes; Hypertension; and Hyperlipidemia   HPI   Chief Complaint  Patient presents with  . Diabetes  . Hypertension  . Hyperlipidemia   Pt without complaint today  Relevant past medical, surgical, family and social history reviewed and updated as indicated. Interim medical history since our last visit reviewed. Allergies and medications reviewed and updated.   Current Outpatient Medications:  .  ALPRAZolam (XANAX) 1 MG tablet, Take 1 tablet (1 mg total) by mouth 3 (three) times daily as needed for anxiety. (Patient taking differently: Take 2 mg by mouth 3 (three) times daily as needed for anxiety. ), Disp: 12 tablet, Rfl: 0 .  amLODipine (NORVASC) 5 MG tablet, Take 1 tablet (5 mg total) by mouth daily., Disp: 30 tablet, Rfl: 0 .  atorvastatin (LIPITOR) 20 MG tablet, Take 1 tablet (20 mg total) by mouth daily., Disp: 30 tablet, Rfl: 0 .  clonazePAM (KLONOPIN) 1 MG tablet, Take 1 mg by mouth 3 (three) times daily as needed for anxiety., Disp: , Rfl:  .  insulin glargine (LANTUS) 100 UNIT/ML injection, Inject 35 Units into the skin at bedtime. Reported on 04/23/2016, Disp: , Rfl:  .  lisinopril (PRINIVIL,ZESTRIL) 20 MG tablet, Take 1 tablet (20 mg total) by mouth daily., Disp: 30 tablet, Rfl: 0 .  metFORMIN (GLUCOPHAGE) 1000 MG tablet, Take 1 tablet (1,000 mg total) by mouth 2 (two) times daily with a meal., Disp: 120 tablet, Rfl: 0 .  metoprolol tartrate (LOPRESSOR) 100 MG tablet, Take 1 tablet (100 mg total) by mouth 2 (two) times daily., Disp: 60 tablet, Rfl: 0 .  omeprazole (PRILOSEC) 20 MG capsule, TAKE 1  Capsule  BY MOUTH TWICE DAILY, Disp: 180 capsule, Rfl: 1 .  PROVENTIL HFA 108 (90 Base) MCG/ACT inhaler, INHALE 2 PUFFS EVERY 6 HOURS AS NEEDED FOR WHEEZING OR SHORTNESS OF BREATH, Disp: 20.1 g, Rfl: 1 .  sertraline (ZOLOFT) 100 MG tablet, Take 2 tablets (200 mg total) by mouth daily., Disp: 60 tablet, Rfl: 0 .  sitaGLIPtin (JANUVIA) 100 MG tablet, Take 1 tablet (100 mg total) by mouth daily., Disp: 90 tablet, Rfl: 0   Review of Systems  Constitutional: Negative for appetite change, chills, diaphoresis, fatigue, fever and unexpected weight change.  HENT: Negative for congestion, dental problem, drooling, ear pain, facial swelling, hearing loss, mouth sores, sneezing, sore throat, trouble swallowing and voice change.   Eyes: Negative for pain, discharge, redness, itching and visual disturbance.  Respiratory: Negative for cough, choking, shortness of breath and wheezing.   Cardiovascular: Negative for chest pain, palpitations and leg swelling.  Gastrointestinal: Negative for abdominal pain, blood in stool, constipation, diarrhea and vomiting.  Endocrine: Negative for cold intolerance, heat intolerance and polydipsia.  Genitourinary: Negative for decreased urine volume, dysuria and hematuria.  Musculoskeletal: Negative for arthralgias, back pain and gait problem.  Skin: Negative for rash.  Allergic/Immunologic: Negative for environmental allergies.  Neurological: Negative for seizures, syncope, light-headedness and headaches.  Hematological: Negative for adenopathy.  Psychiatric/Behavioral: Negative for agitation, dysphoric  mood and suicidal ideas. The patient is not nervous/anxious.     Per HPI unless specifically indicated above     Objective:    BP 124/88 (BP Location: Left Arm, Patient Position: Sitting, Cuff Size: Large)   Pulse 76   Temp (!) 97 F (36.1 C) (Other (Comment))   Ht 6' (1.829 m)   Wt 286 lb (129.7 kg)   SpO2 97%   BMI 38.79 kg/m   Wt Readings from Last 3  Encounters:  07/12/18 286 lb (129.7 kg)  03/16/18 288 lb (130.6 kg)  12/16/17 295 lb 12 oz (134.2 kg)    Physical Exam  Constitutional: He is oriented to person, place, and time. He appears well-developed and well-nourished.  HENT:  Head: Normocephalic and atraumatic.  Neck: Neck supple.  Cardiovascular: Normal rate and regular rhythm.  Pulmonary/Chest: Effort normal and breath sounds normal. He has no wheezes.  Abdominal: Soft. Bowel sounds are normal. There is no hepatosplenomegaly. There is no tenderness.  Musculoskeletal: He exhibits no edema.  Lymphadenopathy:    He has no cervical adenopathy.  Neurological: He is alert and oriented to person, place, and time.  Skin: Skin is warm and dry.  Psychiatric: He has a normal mood and affect. His behavior is normal.  Vitals reviewed.        Assessment & Plan:   Encounter Diagnoses  Name Primary?  Marland Kitchen Uncontrolled type 2 diabetes mellitus with complication, with long-term current use of insulin (HCC) Yes  . Essential hypertension, benign   . Hyperlipidemia, unspecified hyperlipidemia type   . Cigarette nicotine dependence with nicotine-induced disorder   . Obesity, unspecified classification, unspecified obesity type, unspecified whether serious comorbidity present      -pt to get fasting labs drawn -pt to continue current medications -pt to follow up 3 months.  RTO sooner prn

## 2018-07-13 ENCOUNTER — Other Ambulatory Visit: Payer: Self-pay | Admitting: Physician Assistant

## 2018-07-13 DIAGNOSIS — E118 Type 2 diabetes mellitus with unspecified complications: Principal | ICD-10-CM

## 2018-07-13 DIAGNOSIS — I10 Essential (primary) hypertension: Secondary | ICD-10-CM

## 2018-07-13 DIAGNOSIS — Z794 Long term (current) use of insulin: Principal | ICD-10-CM

## 2018-07-13 DIAGNOSIS — E785 Hyperlipidemia, unspecified: Secondary | ICD-10-CM

## 2018-07-13 DIAGNOSIS — E1165 Type 2 diabetes mellitus with hyperglycemia: Secondary | ICD-10-CM

## 2018-07-13 DIAGNOSIS — IMO0002 Reserved for concepts with insufficient information to code with codable children: Secondary | ICD-10-CM

## 2018-08-10 ENCOUNTER — Other Ambulatory Visit: Payer: Self-pay | Admitting: Physician Assistant

## 2018-09-22 ENCOUNTER — Other Ambulatory Visit: Payer: Self-pay | Admitting: Physician Assistant

## 2018-10-06 ENCOUNTER — Other Ambulatory Visit (HOSPITAL_COMMUNITY)
Admission: RE | Admit: 2018-10-06 | Discharge: 2018-10-06 | Disposition: A | Discharge status: Home / Self Care | Payer: Self-pay | Source: Ambulatory Visit | Attending: Physician Assistant | Admitting: Physician Assistant

## 2018-10-06 DIAGNOSIS — E785 Hyperlipidemia, unspecified: Secondary | ICD-10-CM | POA: Insufficient documentation

## 2018-10-06 DIAGNOSIS — E1165 Type 2 diabetes mellitus with hyperglycemia: Secondary | ICD-10-CM | POA: Insufficient documentation

## 2018-10-06 DIAGNOSIS — Z794 Long term (current) use of insulin: Secondary | ICD-10-CM | POA: Insufficient documentation

## 2018-10-06 DIAGNOSIS — I10 Essential (primary) hypertension: Secondary | ICD-10-CM | POA: Insufficient documentation

## 2018-10-06 DIAGNOSIS — E118 Type 2 diabetes mellitus with unspecified complications: Secondary | ICD-10-CM | POA: Insufficient documentation

## 2018-10-06 DIAGNOSIS — IMO0002 Reserved for concepts with insufficient information to code with codable children: Secondary | ICD-10-CM

## 2018-10-06 LAB — HEMOGLOBIN A1C
Hgb A1c MFr Bld: 7.1 % — ABNORMAL HIGH (ref 4.8–5.6)
MEAN PLASMA GLUCOSE: 157.07 mg/dL

## 2018-10-06 LAB — COMPREHENSIVE METABOLIC PANEL
ALT: 67 U/L — ABNORMAL HIGH (ref 0–44)
AST: 46 U/L — AB (ref 15–41)
Albumin: 3.9 g/dL (ref 3.5–5.0)
Alkaline Phosphatase: 67 U/L (ref 38–126)
Anion gap: 9 (ref 5–15)
BILIRUBIN TOTAL: 0.8 mg/dL (ref 0.3–1.2)
BUN: 8 mg/dL (ref 6–20)
CHLORIDE: 102 mmol/L (ref 98–111)
CO2: 24 mmol/L (ref 22–32)
Calcium: 8.6 mg/dL — ABNORMAL LOW (ref 8.9–10.3)
Creatinine, Ser: 0.61 mg/dL (ref 0.61–1.24)
Glucose, Bld: 176 mg/dL — ABNORMAL HIGH (ref 70–99)
POTASSIUM: 3.5 mmol/L (ref 3.5–5.1)
Sodium: 135 mmol/L (ref 135–145)
TOTAL PROTEIN: 7.3 g/dL (ref 6.5–8.1)

## 2018-10-06 LAB — LIPID PANEL
CHOLESTEROL: 128 mg/dL (ref 0–200)
HDL: 24 mg/dL — ABNORMAL LOW (ref >40)
LDL Cholesterol: 66 mg/dL (ref 0–99)
TRIGLYCERIDES: 189 mg/dL — AB (ref <150)
Total CHOL/HDL Ratio: 5.3 RATIO
VLDL: 38 mg/dL (ref 0–40)

## 2018-10-07 LAB — MICROALBUMIN, URINE: Microalb, Ur: 216 ug/mL — ABNORMAL HIGH

## 2018-10-12 ENCOUNTER — Encounter: Payer: Self-pay | Admitting: Physician Assistant

## 2018-10-12 ENCOUNTER — Ambulatory Visit: Payer: Self-pay | Admitting: Physician Assistant

## 2018-10-12 VITALS — BP 169/97 | HR 75 | Temp 97.7°F | Ht 72.0 in | Wt 293.5 lb

## 2018-10-12 DIAGNOSIS — E669 Obesity, unspecified: Secondary | ICD-10-CM

## 2018-10-12 DIAGNOSIS — K219 Gastro-esophageal reflux disease without esophagitis: Secondary | ICD-10-CM

## 2018-10-12 DIAGNOSIS — E785 Hyperlipidemia, unspecified: Secondary | ICD-10-CM

## 2018-10-12 DIAGNOSIS — E118 Type 2 diabetes mellitus with unspecified complications: Secondary | ICD-10-CM

## 2018-10-12 DIAGNOSIS — F17200 Nicotine dependence, unspecified, uncomplicated: Secondary | ICD-10-CM

## 2018-10-12 DIAGNOSIS — Z794 Long term (current) use of insulin: Secondary | ICD-10-CM

## 2018-10-12 DIAGNOSIS — I10 Essential (primary) hypertension: Secondary | ICD-10-CM

## 2018-10-12 NOTE — Progress Notes (Signed)
BP (!) 169/97 (BP Location: Left Arm, Patient Position: Sitting, Cuff Size: Normal)   Pulse 75   Temp 97.7 F (36.5 C)   Ht 6' (1.829 m)   Wt 293 lb 8 oz (133.1 kg)   SpO2 95%   BMI 39.81 kg/m    Subjective:    Patient ID: Edwin Ford, male    DOB: [birth year], 37 y.o.   MRN: [id number]  HPI: Edwin Ford is a 37 y.o. male presenting on 10/12/2018 for Diabetes and Hyperlipidemia   HPI   Pt complains of sinus drainage.  He used some OTC sinus medication.   Pt also with diarrhea.  Says it's going around the home with others at home sick.  The first sick are now getting well.  Relevant past medical, surgical, family and social history reviewed and updated as indicated. Interim medical history since our last visit reviewed. Allergies and medications reviewed and updated.   Current Outpatient Medications:  .  ALPRAZolam (XANAX) 1 MG tablet, Take 1 tablet (1 mg total) by mouth 3 (three) times daily as needed for anxiety. (Patient taking differently: Take 2 mg by mouth 3 (three) times daily as needed for anxiety. ), Disp: 12 tablet, Rfl: 0 .  amLODipine (NORVASC) 5 MG tablet, TAKE 1 Tablet BY MOUTH ONCE DAILY, Disp: 90 tablet, Rfl: 0 .  atorvastatin (LIPITOR) 20 MG tablet, TAKE 1 Tablet BY MOUTH ONCE DAILY, Disp: 90 tablet, Rfl: 0 .  clonazePAM (KLONOPIN) 1 MG tablet, Take 1 mg by mouth 3 (three) times daily as needed for anxiety., Disp: , Rfl:  .  insulin glargine (LANTUS) 100 UNIT/ML injection, Inject 40 Units into the skin at bedtime. Reported on 04/23/2016, Disp: , Rfl:  .  lisinopril (PRINIVIL,ZESTRIL) 20 MG tablet, TAKE 1 Tablet BY MOUTH ONCE DAILY, Disp: 90 tablet, Rfl: 0 .  metFORMIN (GLUCOPHAGE) 1000 MG tablet, TAKE 1 Tablet  BY MOUTH TWICE DAILY WITH MEALS, Disp: 180 tablet, Rfl: 1 .  metoprolol tartrate (LOPRESSOR) 100 MG tablet, TAKE 1 Tablet  BY MOUTH TWICE DAILY, Disp: 180 tablet, Rfl: 0 .  omeprazole (PRILOSEC) 20 MG capsule, TAKE 1 Capsule  BY MOUTH TWICE DAILY, Disp:  180 capsule, Rfl: 1 .  PROVENTIL HFA 108 (90 Base) MCG/ACT inhaler, INHALE 2 PUFFS EVERY 6 HOURS AS NEEDED FOR WHEEZING OR SHORTNESS OF BREATH, Disp: 20.1 g, Rfl: 1 .  sertraline (ZOLOFT) 100 MG tablet, Take 2 tablets (200 mg total) by mouth daily., Disp: 60 tablet, Rfl: 0 .  sitaGLIPtin (JANUVIA) 100 MG tablet, Take 1 tablet (100 mg total) by mouth daily., Disp: 90 tablet, Rfl: 0   Review of Systems  Constitutional: Negative for appetite change, chills, diaphoresis, fatigue, fever and unexpected weight change.  HENT: Negative for congestion, dental problem, drooling, ear pain, facial swelling, hearing loss, mouth sores, sneezing, sore throat, trouble swallowing and voice change.   Eyes: Negative for pain, discharge, redness, itching and visual disturbance.  Respiratory: Negative for cough, choking, shortness of breath and wheezing.   Cardiovascular: Negative for chest pain, palpitations and leg swelling.  Gastrointestinal: Negative for abdominal pain, blood in stool, constipation, diarrhea and vomiting.  Endocrine: Negative for cold intolerance, heat intolerance and polydipsia.  Genitourinary: Negative for decreased urine volume, dysuria and hematuria.  Musculoskeletal: Negative for arthralgias, back pain and gait problem.  Skin: Negative for rash.  Allergic/Immunologic: Negative for environmental allergies.  Neurological: Negative for seizures, syncope, light-headedness and headaches.  Hematological: Negative for adenopathy.  Psychiatric/Behavioral: Negative for agitation,  dysphoric mood and suicidal ideas. The patient is not nervous/anxious.     Per HPI unless specifically indicated above     Objective:    BP (!) 169/97 (BP Location: Left Arm, Patient Position: Sitting, Cuff Size: Normal)   Pulse 75   Temp 97.7 F (36.5 C)   Ht 6' (1.829 m)   Wt 293 lb 8 oz (133.1 kg)   SpO2 95%   BMI 39.81 kg/m   Wt Readings from Last 3 Encounters:  10/12/18 293 lb 8 oz (133.1 kg)  07/12/18  286 lb (129.7 kg)  03/16/18 288 lb (130.6 kg)    Physical Exam  Constitutional: He is oriented to person, place, and time. He appears well-developed and well-nourished.  HENT:  Head: Normocephalic and atraumatic.  Neck: Neck supple.  Cardiovascular: Normal rate and regular rhythm.  Pulmonary/Chest: Effort normal and breath sounds normal. He has no wheezes.  Abdominal: Soft. Bowel sounds are normal. There is no hepatosplenomegaly. There is no tenderness.  Musculoskeletal: He exhibits no edema.  Lymphadenopathy:    He has no cervical adenopathy.  Neurological: He is alert and oriented to person, place, and time.  Skin: Skin is warm and dry.  Psychiatric: He has a normal mood and affect. His behavior is normal.  Vitals reviewed.   Results for orders placed or performed during the hospital encounter of 10/06/18  Microalbumin, urine  Result Value Ref Range   Microalb, Ur 216.0 (H) Not Estab. ug/mL  Comprehensive metabolic panel  Result Value Ref Range   Sodium 135 135 - 145 mmol/L   Potassium 3.5 3.5 - 5.1 mmol/L   Chloride 102 98 - 111 mmol/L   CO2 24 22 - 32 mmol/L   Glucose, Bld 176 (H) 70 - 99 mg/dL   BUN 8 6 - 20 mg/dL   Creatinine, Ser 1.61 0.61 - 1.24 mg/dL   Calcium 8.6 (L) 8.9 - 10.3 mg/dL   Total Protein 7.3 6.5 - 8.1 g/dL   Albumin 3.9 3.5 - 5.0 g/dL   AST 46 (H) 15 - 41 U/L   ALT 67 (H) 0 - 44 U/L   Alkaline Phosphatase 67 38 - 126 U/L   Total Bilirubin 0.8 0.3 - 1.2 mg/dL   GFR calc non Af Amer >60 >60 mL/min   GFR calc Af Amer >60 >60 mL/min   Anion gap 9 5 - 15  Lipid panel  Result Value Ref Range   Cholesterol 128 0 - 200 mg/dL   Triglycerides 096 (H) <150 mg/dL   HDL 24 (L) >04 mg/dL   Total CHOL/HDL Ratio 5.3 RATIO   VLDL 38 0 - 40 mg/dL   LDL Cholesterol 66 0 - 99 mg/dL  Hemoglobin V4U  Result Value Ref Range   Hgb A1c MFr Bld 7.1 (H) 4.8 - 5.6 %   Mean Plasma Glucose 157.07 mg/dL      Assessment & Plan:    Encounter Diagnoses  Name Primary?   . Essential hypertension, benign Yes  . Type 2 diabetes mellitus with complication, with long-term current use of insulin (HCC)   . Hyperlipidemia, unspecified hyperlipidemia type   . Tobacco use disorder   . Obesity, unspecified classification, unspecified obesity type, unspecified whether serious comorbidity present   . Gastroesophageal reflux disease, esophagitis presence not specified     -reviewed labs with pt -pt to continue current medications -counseled to avoid OTC decongestants due to elevations in BP.  bp good at last OV -will recheck bp in 1 month.  RTO sooner prn

## 2018-10-12 NOTE — Patient Instructions (Signed)
DASH Eating Plan DASH stands for "Dietary Approaches to Stop Hypertension." The DASH eating plan is a healthy eating plan that has been shown to reduce high blood pressure (hypertension). It may also reduce your risk for type 2 diabetes, heart disease, and stroke. The DASH eating plan may also help with weight loss. What are tips for following this plan? General guidelines  Avoid eating more than 2,300 mg (milligrams) of salt (sodium) a day. If you have hypertension, you may need to reduce your sodium intake to 1,500 mg a day.  Limit alcohol intake to no more than 1 drink a day for nonpregnant women and 2 drinks a day for men. One drink equals 12 oz of beer, 5 oz of wine, or 1 oz of hard liquor.  Work with your health care provider to maintain a healthy body weight or to lose weight. Ask what an ideal weight is for you.  Get at least 30 minutes of exercise that causes your heart to beat faster (aerobic exercise) most days of the week. Activities may include walking, swimming, or biking.  Work with your health care provider or diet and nutrition specialist (dietitian) to adjust your eating plan to your individual calorie needs. Reading food labels  Check food labels for the amount of sodium per serving. Choose foods with less than 5 percent of the Daily Value of sodium. Generally, foods with less than 300 mg of sodium per serving fit into this eating plan.  To find whole grains, look for the word "whole" as the first word in the ingredient list. Shopping  Buy products labeled as "low-sodium" or "no salt added."  Buy fresh foods. Avoid canned foods and premade or frozen meals. Cooking  Avoid adding salt when cooking. Use salt-free seasonings or herbs instead of table salt or sea salt. Check with your health care provider or pharmacist before using salt substitutes.  Do not fry foods. Cook foods using healthy methods such as baking, boiling, grilling, and broiling instead.  Cook with  heart-healthy oils, such as olive, canola, soybean, or sunflower oil. Meal planning   Eat a balanced diet that includes: ? 5 or more servings of fruits and vegetables each day. At each meal, try to fill half of your plate with fruits and vegetables. ? Up to 6-8 servings of whole grains each day. ? Less than 6 oz of lean meat, poultry, or fish each day. A 3-oz serving of meat is about the same size as a deck of cards. One egg equals 1 oz. ? 2 servings of low-fat dairy each day. ? A serving of nuts, seeds, or beans 5 times each week. ? Heart-healthy fats. Healthy fats called Omega-3 fatty acids are found in foods such as flaxseeds and coldwater fish, like sardines, salmon, and mackerel.  Limit how much you eat of the following: ? Canned or prepackaged foods. ? Food that is high in trans fat, such as fried foods. ? Food that is high in saturated fat, such as fatty meat. ? Sweets, desserts, sugary drinks, and other foods with added sugar. ? Full-fat dairy products.  Do not salt foods before eating.  Try to eat at least 2 vegetarian meals each week.  Eat more home-cooked food and less restaurant, buffet, and fast food.  When eating at a restaurant, ask that your food be prepared with less salt or no salt, if possible. What foods are recommended? The items listed may not be a complete list. Talk with your dietitian about what   dietary choices are best for you. Grains Whole-grain or whole-wheat bread. Whole-grain or whole-wheat pasta. Brown rice. Oatmeal. Quinoa. Bulgur. Whole-grain and low-sodium cereals. Pita bread. Low-fat, low-sodium crackers. Whole-wheat flour tortillas. Vegetables Fresh or frozen vegetables (raw, steamed, roasted, or grilled). Low-sodium or reduced-sodium tomato and vegetable juice. Low-sodium or reduced-sodium tomato sauce and tomato paste. Low-sodium or reduced-sodium canned vegetables. Fruits All fresh, dried, or frozen fruit. Canned fruit in natural juice (without  added sugar). Meat and other protein foods Skinless chicken or turkey. Ground chicken or turkey. Pork with fat trimmed off. Fish and seafood. Egg whites. Dried beans, peas, or lentils. Unsalted nuts, nut butters, and seeds. Unsalted canned beans. Lean cuts of beef with fat trimmed off. Low-sodium, lean deli meat. Dairy Low-fat (1%) or fat-free (skim) milk. Fat-free, low-fat, or reduced-fat cheeses. Nonfat, low-sodium ricotta or cottage cheese. Low-fat or nonfat yogurt. Low-fat, low-sodium cheese. Fats and oils Soft margarine without trans fats. Vegetable oil. Low-fat, reduced-fat, or light mayonnaise and salad dressings (reduced-sodium). Canola, safflower, olive, soybean, and sunflower oils. Avocado. Seasoning and other foods Herbs. Spices. Seasoning mixes without salt. Unsalted popcorn and pretzels. Fat-free sweets. What foods are not recommended? The items listed may not be a complete list. Talk with your dietitian about what dietary choices are best for you. Grains Baked goods made with fat, such as croissants, muffins, or some breads. Dry pasta or rice meal packs. Vegetables Creamed or fried vegetables. Vegetables in a cheese sauce. Regular canned vegetables (not low-sodium or reduced-sodium). Regular canned tomato sauce and paste (not low-sodium or reduced-sodium). Regular tomato and vegetable juice (not low-sodium or reduced-sodium). Pickles. Olives. Fruits Canned fruit in a light or heavy syrup. Fried fruit. Fruit in cream or butter sauce. Meat and other protein foods Fatty cuts of meat. Ribs. Fried meat. Bacon. Sausage. Bologna and other processed lunch meats. Salami. Fatback. Hotdogs. Bratwurst. Salted nuts and seeds. Canned beans with added salt. Canned or smoked fish. Whole eggs or egg yolks. Chicken or turkey with skin. Dairy Whole or 2% milk, cream, and half-and-half. Whole or full-fat cream cheese. Whole-fat or sweetened yogurt. Full-fat cheese. Nondairy creamers. Whipped toppings.  Processed cheese and cheese spreads. Fats and oils Butter. Stick margarine. Lard. Shortening. Ghee. Bacon fat. Tropical oils, such as coconut, palm kernel, or palm oil. Seasoning and other foods Salted popcorn and pretzels. Onion salt, garlic salt, seasoned salt, table salt, and sea salt. Worcestershire sauce. Tartar sauce. Barbecue sauce. Teriyaki sauce. Soy sauce, including reduced-sodium. Steak sauce. Canned and packaged gravies. Fish sauce. Oyster sauce. Cocktail sauce. Horseradish that you find on the shelf. Ketchup. Mustard. Meat flavorings and tenderizers. Bouillon cubes. Hot sauce and Tabasco sauce. Premade or packaged marinades. Premade or packaged taco seasonings. Relishes. Regular salad dressings. Where to find more information:  National Heart, Lung, and Blood Institute: www.nhlbi.nih.gov  American Heart Association: www.heart.org Summary  The DASH eating plan is a healthy eating plan that has been shown to reduce high blood pressure (hypertension). It may also reduce your risk for type 2 diabetes, heart disease, and stroke.  With the DASH eating plan, you should limit salt (sodium) intake to 2,300 mg a day. If you have hypertension, you may need to reduce your sodium intake to 1,500 mg a day.  When on the DASH eating plan, aim to eat more fresh fruits and vegetables, whole grains, lean proteins, low-fat dairy, and heart-healthy fats.  Work with your health care provider or diet and nutrition specialist (dietitian) to adjust your eating plan to your individual   calorie needs. This information is not intended to replace advice given to you by your health care provider. Make sure you discuss any questions you have with your health care provider. Document Released: 10/23/2011 Document Revised: 10/27/2016 Document Reviewed: 10/27/2016 Elsevier Interactive Patient Education  2018 [employer] Inc.  

## 2018-10-22 ENCOUNTER — Other Ambulatory Visit: Payer: Self-pay | Admitting: Physician Assistant

## 2018-11-01 ENCOUNTER — Ambulatory Visit: Payer: Self-pay | Admitting: Physician Assistant

## 2018-11-01 ENCOUNTER — Encounter: Payer: Self-pay | Admitting: Physician Assistant

## 2018-11-01 VITALS — BP 155/101 | HR 68 | Temp 97.5°F | Ht 72.0 in | Wt 296.5 lb

## 2018-11-01 DIAGNOSIS — F329 Major depressive disorder, single episode, unspecified: Secondary | ICD-10-CM

## 2018-11-01 DIAGNOSIS — F32A Depression, unspecified: Secondary | ICD-10-CM

## 2018-11-01 DIAGNOSIS — Z794 Long term (current) use of insulin: Secondary | ICD-10-CM

## 2018-11-01 DIAGNOSIS — E118 Type 2 diabetes mellitus with unspecified complications: Secondary | ICD-10-CM

## 2018-11-01 DIAGNOSIS — K219 Gastro-esophageal reflux disease without esophagitis: Secondary | ICD-10-CM

## 2018-11-01 DIAGNOSIS — E785 Hyperlipidemia, unspecified: Secondary | ICD-10-CM

## 2018-11-01 DIAGNOSIS — F17200 Nicotine dependence, unspecified, uncomplicated: Secondary | ICD-10-CM

## 2018-11-01 DIAGNOSIS — I10 Essential (primary) hypertension: Secondary | ICD-10-CM

## 2018-11-01 MED ORDER — METOPROLOL TARTRATE 100 MG PO TABS: 100.0000 mg | ORAL_TABLET | Freq: Two times a day (BID) | ORAL | 1 refills | Status: DC

## 2018-11-01 MED ORDER — ALBUTEROL SULFATE HFA 108 (90 BASE) MCG/ACT IN AERS: INHALATION_SPRAY | RESPIRATORY_TRACT | 1 refills | Status: DC

## 2018-11-01 MED ORDER — AMLODIPINE BESYLATE 10 MG PO TABS: 10.0000 mg | ORAL_TABLET | Freq: Every day | ORAL | 3 refills | Status: DC

## 2018-11-01 MED ORDER — SITAGLIPTIN PHOSPHATE 100 MG PO TABS: 100.0000 mg | ORAL_TABLET | Freq: Every day | ORAL | 0 refills | Status: DC

## 2018-11-01 MED ORDER — LISINOPRIL 20 MG PO TABS: 20.0000 mg | ORAL_TABLET | Freq: Every day | ORAL | 1 refills | Status: DC

## 2018-11-01 MED ORDER — OMEPRAZOLE 20 MG PO CPDR: 20.0000 mg | DELAYED_RELEASE_CAPSULE | Freq: Two times a day (BID) | ORAL | 1 refills | Status: DC

## 2018-11-01 MED ORDER — ATORVASTATIN CALCIUM 20 MG PO TABS: 20.0000 mg | ORAL_TABLET | Freq: Every day | ORAL | 1 refills | Status: DC

## 2018-11-01 NOTE — Progress Notes (Signed)
BP (!) 155/101 (BP Location: Right Arm, Patient Position: Sitting, Cuff Size: Normal)   Pulse 68   Temp (!) 97.5 F (36.4 C)   Ht 6' (1.829 m)   Wt 296 lb 8 oz (134.5 kg)   SpO2 99%   BMI 40.21 kg/m    Subjective:    Patient ID: Edwin Ford, male    DOB: May 26, 1981, 37 y.o.   MRN: [id number]  HPI: Edwin Ford is a 37 y.o. male presenting on 11/01/2018 for Hypertension   HPI  Pt is no longer taking OTC decongestants.   He is feeling well today.   Relevant past medical, surgical, family and social history reviewed and updated as indicated. Interim medical history since our last visit reviewed. Allergies and medications reviewed and updated.   Current Outpatient Medications:  .  ALPRAZolam (XANAX) 1 MG tablet, Take 1 tablet (1 mg total) by mouth 3 (three) times daily as needed for anxiety. (Patient taking differently: Take 2 mg by mouth 3 (three) times daily as needed for anxiety. ), Disp: 12 tablet, Rfl: 0 .  amLODipine (NORVASC) 5 MG tablet, TAKE 1 Tablet BY MOUTH ONCE DAILY, Disp: 90 tablet, Rfl: 0 .  atorvastatin (LIPITOR) 20 MG tablet, TAKE 1 Tablet BY MOUTH ONCE DAILY, Disp: 90 tablet, Rfl: 0 .  clonazePAM (KLONOPIN) 1 MG tablet, Take 1 mg by mouth 3 (three) times daily as needed for anxiety., Disp: , Rfl:  .  insulin glargine (LANTUS) 100 UNIT/ML injection, Inject 40 Units into the skin at bedtime. Reported on 04/23/2016, Disp: , Rfl:  .  JANUVIA 100 MG tablet, TAKE 1 Tablet BY MOUTH ONCE DAILY, Disp: 90 tablet, Rfl: 0 .  lisinopril (PRINIVIL,ZESTRIL) 20 MG tablet, TAKE 1 Tablet BY MOUTH ONCE DAILY, Disp: 90 tablet, Rfl: 0 .  metFORMIN (GLUCOPHAGE) 1000 MG tablet, TAKE 1 Tablet  BY MOUTH TWICE DAILY WITH MEALS, Disp: 180 tablet, Rfl: 1 .  metoprolol tartrate (LOPRESSOR) 100 MG tablet, TAKE 1 Tablet  BY MOUTH TWICE DAILY, Disp: 180 tablet, Rfl: 0 .  omeprazole (PRILOSEC) 20 MG capsule, TAKE 1 Capsule  BY MOUTH TWICE DAILY, Disp: 180 capsule, Rfl: 1 .  PROVENTIL HFA 108  (90 Base) MCG/ACT inhaler, INHALE 2 PUFFS EVERY 6 HOURS AS NEEDED FOR WHEEZING OR SHORTNESS OF BREATH, Disp: 20.1 g, Rfl: 1 .  sertraline (ZOLOFT) 100 MG tablet, Take 2 tablets (200 mg total) by mouth daily., Disp: 60 tablet, Rfl: 0     Review of Systems  Constitutional: Negative for appetite change, chills, diaphoresis, fatigue, fever and unexpected weight change.  HENT: Negative for congestion, dental problem, drooling, ear pain, facial swelling, hearing loss, mouth sores, sneezing, sore throat, trouble swallowing and voice change.   Eyes: Negative for pain, discharge, redness, itching and visual disturbance.  Respiratory: Negative for cough, choking, shortness of breath and wheezing.   Cardiovascular: Negative for chest pain, palpitations and leg swelling.  Gastrointestinal: Negative for abdominal pain, blood in stool, constipation, diarrhea and vomiting.  Endocrine: Negative for cold intolerance, heat intolerance and polydipsia.  Genitourinary: Negative for decreased urine volume, dysuria and hematuria.  Musculoskeletal: Negative for arthralgias, back pain and gait problem.  Skin: Negative for rash.  Allergic/Immunologic: Negative for environmental allergies.  Neurological: Negative for seizures, syncope, light-headedness and headaches.  Hematological: Negative for adenopathy.  Psychiatric/Behavioral: Negative for agitation, dysphoric mood and suicidal ideas. The patient is not nervous/anxious.     Per HPI unless specifically indicated above     Objective:  BP (!) 155/101 (BP Location: Right Arm, Patient Position: Sitting, Cuff Size: Normal)   Pulse 68   Temp (!) 97.5 F (36.4 C)   Ht 6' (1.829 m)   Wt 296 lb 8 oz (134.5 kg)   SpO2 99%   BMI 40.21 kg/m   Wt Readings from Last 3 Encounters:  11/01/18 296 lb 8 oz (134.5 kg)  10/12/18 293 lb 8 oz (133.1 kg)  07/12/18 286 lb (129.7 kg)    Physical Exam Vitals signs reviewed.  Constitutional:      Appearance: He is  well-developed.  HENT:     Head: Normocephalic and atraumatic.  Neck:     Musculoskeletal: Neck supple.  Cardiovascular:     Rate and Rhythm: Normal rate and regular rhythm.  Pulmonary:     Effort: Pulmonary effort is normal.     Breath sounds: Normal breath sounds. No wheezing.  Abdominal:     General: Bowel sounds are normal.     Palpations: Abdomen is soft.     Tenderness: There is no abdominal tenderness.  Lymphadenopathy:     Cervical: No cervical adenopathy.  Skin:    General: Skin is warm and dry.  Neurological:     Mental Status: He is alert and oriented to person, place, and time.  Psychiatric:        Behavior: Behavior normal.         Assessment & Plan:   Encounter Diagnoses  Name Primary?  . Essential hypertension, benign Yes  . Type 2 diabetes mellitus with complication, with long-term current use of insulin (HCC)   . Hyperlipidemia, unspecified hyperlipidemia type   . Gastroesophageal reflux disease, esophagitis presence not specified   . Tobacco use disorder   . Depression, unspecified depression type   . Morbid obesity (HCC)      -will Increase amlodipine to 10mg  daily -continue other medications -pt to follow up 2 months.  RTO sooner prn

## 2018-12-27 ENCOUNTER — Other Ambulatory Visit (HOSPITAL_COMMUNITY)
Admission: RE | Admit: 2018-12-27 | Discharge: 2018-12-27 | Disposition: A | Discharge status: Home / Self Care | Payer: Self-pay | Source: Ambulatory Visit | Attending: Physician Assistant | Admitting: Physician Assistant

## 2018-12-27 DIAGNOSIS — I10 Essential (primary) hypertension: Secondary | ICD-10-CM

## 2018-12-27 DIAGNOSIS — Z794 Long term (current) use of insulin: Secondary | ICD-10-CM

## 2018-12-27 DIAGNOSIS — E118 Type 2 diabetes mellitus with unspecified complications: Secondary | ICD-10-CM | POA: Insufficient documentation

## 2018-12-27 DIAGNOSIS — E785 Hyperlipidemia, unspecified: Secondary | ICD-10-CM

## 2018-12-27 LAB — HEMOGLOBIN A1C
Hgb A1c MFr Bld: 8.2 % — ABNORMAL HIGH (ref 4.8–5.6)
Mean Plasma Glucose: 188.64 mg/dL

## 2018-12-27 LAB — LIPID PANEL
Cholesterol: 150 mg/dL (ref 0–200)
HDL: 27 mg/dL — AB (ref >40)
LDL CALC: 75 mg/dL (ref 0–99)
Total CHOL/HDL Ratio: 5.6 RATIO
Triglycerides: 239 mg/dL — ABNORMAL HIGH (ref <150)
VLDL: 48 mg/dL — ABNORMAL HIGH (ref 0–40)

## 2018-12-27 LAB — COMPREHENSIVE METABOLIC PANEL WITH GFR
ALT: 66 U/L — ABNORMAL HIGH (ref 0–44)
AST: 40 U/L (ref 15–41)
Albumin: 3.9 g/dL (ref 3.5–5.0)
Alkaline Phosphatase: 61 U/L (ref 38–126)
Anion gap: 10 (ref 5–15)
BUN: 9 mg/dL (ref 6–20)
CO2: 24 mmol/L (ref 22–32)
Calcium: 8.7 mg/dL — ABNORMAL LOW (ref 8.9–10.3)
Chloride: 101 mmol/L (ref 98–111)
Creatinine, Ser: 0.66 mg/dL (ref 0.61–1.24)
GFR calc Af Amer: >60 mL/min
GFR calc non Af Amer: >60 mL/min
Glucose, Bld: 148 mg/dL — ABNORMAL HIGH (ref 70–99)
Potassium: 3.7 mmol/L (ref 3.5–5.1)
Sodium: 135 mmol/L (ref 135–145)
Total Bilirubin: 0.5 mg/dL (ref 0.3–1.2)
Total Protein: 7.2 g/dL (ref 6.5–8.1)

## 2018-12-30 ENCOUNTER — Ambulatory Visit: Payer: Self-pay | Admitting: Physician Assistant

## 2019-02-03 ENCOUNTER — Encounter: Payer: Self-pay | Admitting: Physician Assistant

## 2019-02-03 ENCOUNTER — Ambulatory Visit: Payer: Self-pay | Admitting: Physician Assistant

## 2019-02-03 ENCOUNTER — Other Ambulatory Visit: Payer: Self-pay

## 2019-02-03 VITALS — BP 136/88 | HR 68 | Temp 97.2°F

## 2019-02-03 DIAGNOSIS — I10 Essential (primary) hypertension: Secondary | ICD-10-CM

## 2019-02-03 DIAGNOSIS — E1165 Type 2 diabetes mellitus with hyperglycemia: Secondary | ICD-10-CM

## 2019-02-03 DIAGNOSIS — E118 Type 2 diabetes mellitus with unspecified complications: Principal | ICD-10-CM

## 2019-02-03 DIAGNOSIS — K219 Gastro-esophageal reflux disease without esophagitis: Secondary | ICD-10-CM

## 2019-02-03 DIAGNOSIS — E785 Hyperlipidemia, unspecified: Secondary | ICD-10-CM

## 2019-02-03 DIAGNOSIS — IMO0002 Reserved for concepts with insufficient information to code with codable children: Secondary | ICD-10-CM

## 2019-02-03 DIAGNOSIS — Z794 Long term (current) use of insulin: Principal | ICD-10-CM

## 2019-02-03 DIAGNOSIS — F17200 Nicotine dependence, unspecified, uncomplicated: Secondary | ICD-10-CM

## 2019-02-03 NOTE — Progress Notes (Signed)
BP 136/88   Pulse 68   Temp (!) 97.2 F (36.2 C) (Oral)   SpO2 99%    Subjective:    Patient ID: Edwin Ford, male    DOB: 1981-08-30, 38 y.o.   MRN: [id number]  HPI: Edwin Ford is a 38 y.o. male presenting on 02/03/2019 for Follow-up   HPI   Pt has chronic cough- no recent changes.  Pt continues to smoke.  No fevers or SOB.   He says he is doing well and has no complaints.   Relevant past medical, surgical, family and social history reviewed and updated as indicated. Interim medical history since our last visit reviewed. Allergies and medications reviewed and updated.   Current Outpatient Medications:  .  albuterol (PROVENTIL HFA) 108 (90 Base) MCG/ACT inhaler, INHALE 2 PUFFS EVERY 6 HOURS AS NEEDED FOR WHEEZING OR SHORTNESS OF BREATH, Disp: 20.1 g, Rfl: 1 .  ALPRAZolam (XANAX) 1 MG tablet, Take 1 tablet (1 mg total) by mouth 3 (three) times daily as needed for anxiety. (Patient taking differently: Take 2 mg by mouth 3 (three) times daily as needed for anxiety. ), Disp: 12 tablet, Rfl: 0 .  amLODipine (NORVASC) 10 MG tablet, Take 1 tablet (10 mg total) by mouth daily., Disp: 90 tablet, Rfl: 3 .  atorvastatin (LIPITOR) 20 MG tablet, Take 1 tablet (20 mg total) by mouth daily., Disp: 90 tablet, Rfl: 1 .  clonazePAM (KLONOPIN) 1 MG tablet, Take 1 mg by mouth 3 (three) times daily as needed for anxiety., Disp: , Rfl:  .  insulin glargine (LANTUS) 100 UNIT/ML injection, Inject 40 Units into the skin at bedtime. Reported on 04/23/2016, Disp: , Rfl:  .  lisinopril (PRINIVIL,ZESTRIL) 20 MG tablet, Take 1 tablet (20 mg total) by mouth daily., Disp: 90 tablet, Rfl: 1 .  metFORMIN (GLUCOPHAGE) 1000 MG tablet, TAKE 1 Tablet  BY MOUTH TWICE DAILY WITH MEALS, Disp: 180 tablet, Rfl: 1 .  metoprolol tartrate (LOPRESSOR) 100 MG tablet, Take 1 tablet (100 mg total) by mouth 2 (two) times daily., Disp: 180 tablet, Rfl: 1 .  omeprazole (PRILOSEC) 20 MG capsule, Take 1 capsule (20 mg total) by  mouth 2 (two) times daily., Disp: 180 capsule, Rfl: 1 .  sertraline (ZOLOFT) 100 MG tablet, Take 2 tablets (200 mg total) by mouth daily., Disp: 60 tablet, Rfl: 0 .  sitaGLIPtin (JANUVIA) 100 MG tablet, Take 1 tablet (100 mg total) by mouth daily., Disp: 90 tablet, Rfl: 0   Review of Systems  Constitutional: Negative for appetite change, chills, diaphoresis, fatigue, fever and unexpected weight change.  HENT: Negative for congestion, dental problem, drooling, ear pain, facial swelling, hearing loss, mouth sores, sneezing, sore throat, trouble swallowing and voice change.   Eyes: Negative for pain, discharge, redness, itching and visual disturbance.  Respiratory: Negative for cough, choking, shortness of breath and wheezing.   Cardiovascular: Negative for chest pain, palpitations and leg swelling.  Gastrointestinal: Negative for abdominal pain, blood in stool, constipation, diarrhea and vomiting.  Endocrine: Negative for cold intolerance, heat intolerance and polydipsia.  Genitourinary: Negative for decreased urine volume, dysuria and hematuria.  Musculoskeletal: Negative for arthralgias, back pain and gait problem.  Skin: Negative for rash.  Allergic/Immunologic: Negative for environmental allergies.  Neurological: Negative for seizures, syncope, light-headedness and headaches.  Hematological: Negative for adenopathy.  Psychiatric/Behavioral: Negative for agitation, dysphoric mood and suicidal ideas. The patient is not nervous/anxious.     Per HPI unless specifically indicated above  Objective:    BP 136/88   Pulse 68   Temp (!) 97.2 F (36.2 C) (Oral)   SpO2 99%   Wt Readings from Last 3 Encounters:  11/01/18 296 lb 8 oz (134.5 kg)  10/12/18 293 lb 8 oz (133.1 kg)  07/12/18 286 lb (129.7 kg)    Physical Exam Vitals signs reviewed.  Constitutional:      Appearance: He is well-developed.  HENT:     Head: Normocephalic and atraumatic.  Neck:     Musculoskeletal: Neck  supple.  Cardiovascular:     Rate and Rhythm: Normal rate and regular rhythm.  Pulmonary:     Effort: Pulmonary effort is normal.     Breath sounds: Normal breath sounds. No wheezing.  Abdominal:     General: Bowel sounds are normal.     Palpations: Abdomen is soft.     Tenderness: There is no abdominal tenderness.  Lymphadenopathy:     Cervical: No cervical adenopathy.  Skin:    General: Skin is warm and dry.  Neurological:     Mental Status: He is alert and oriented to person, place, and time.  Psychiatric:        Behavior: Behavior normal.     Results for orders placed or performed during the hospital encounter of 12/27/18  Lipid panel  Result Value Ref Range   Cholesterol 150 0 - 200 mg/dL   Triglycerides 206 (H) <150 mg/dL   HDL 27 (L) >01 mg/dL   Total CHOL/HDL Ratio 5.6 RATIO   VLDL 48 (H) 0 - 40 mg/dL   LDL Cholesterol 75 0 - 99 mg/dL  Comprehensive metabolic panel  Result Value Ref Range   Sodium 135 135 - 145 mmol/L   Potassium 3.7 3.5 - 5.1 mmol/L   Chloride 101 98 - 111 mmol/L   CO2 24 22 - 32 mmol/L   Glucose, Bld 148 (H) 70 - 99 mg/dL   BUN 9 6 - 20 mg/dL   Creatinine, Ser 5.61 0.61 - 1.24 mg/dL   Calcium 8.7 (L) 8.9 - 10.3 mg/dL   Total Protein 7.2 6.5 - 8.1 g/dL   Albumin 3.9 3.5 - 5.0 g/dL   AST 40 15 - 41 U/L   ALT 66 (H) 0 - 44 U/L   Alkaline Phosphatase 61 38 - 126 U/L   Total Bilirubin 0.5 0.3 - 1.2 mg/dL   GFR calc non Af Amer >60 >60 mL/min   GFR calc Af Amer >60 >60 mL/min   Anion gap 10 5 - 15  Hemoglobin A1c  Result Value Ref Range   Hgb A1c MFr Bld 8.2 (H) 4.8 - 5.6 %   Mean Plasma Glucose 188.64 mg/dL      Assessment & Plan:   Encounter Diagnoses  Name Primary?  Marland Kitchen Uncontrolled type 2 diabetes mellitus with complication, with long-term current use of insulin (HCC) Yes  . Essential hypertension, benign   . Hyperlipidemia, unspecified hyperlipidemia type   . Gastroesophageal reflux disease, esophagitis presence not specified   .  Tobacco use disorder   . Morbid obesity (HCC)     -reviewed labs with pt -pt counseled to Watch diabetic diet -pt to conintue current medications -counseled smoking cessation -Pt has a smartphone -pt to follow up 3 months.  RTO sooner prn

## 2019-02-15 ENCOUNTER — Other Ambulatory Visit: Payer: Self-pay | Admitting: Physician Assistant

## 2019-04-21 ENCOUNTER — Other Ambulatory Visit: Payer: Self-pay | Admitting: Physician Assistant

## 2019-04-21 DIAGNOSIS — E1165 Type 2 diabetes mellitus with hyperglycemia: Secondary | ICD-10-CM

## 2019-04-21 DIAGNOSIS — IMO0002 Reserved for concepts with insufficient information to code with codable children: Secondary | ICD-10-CM

## 2019-04-21 DIAGNOSIS — E785 Hyperlipidemia, unspecified: Secondary | ICD-10-CM

## 2019-04-21 DIAGNOSIS — I10 Essential (primary) hypertension: Secondary | ICD-10-CM

## 2019-05-05 ENCOUNTER — Other Ambulatory Visit (HOSPITAL_COMMUNITY)
Admission: RE | Admit: 2019-05-05 | Discharge: 2019-05-05 | Disposition: A | Discharge status: Home / Self Care | Payer: Self-pay | Source: Ambulatory Visit | Attending: Physician Assistant | Admitting: Physician Assistant

## 2019-05-05 ENCOUNTER — Ambulatory Visit: Payer: Self-pay | Admitting: Physician Assistant

## 2019-05-05 ENCOUNTER — Other Ambulatory Visit: Payer: Self-pay

## 2019-05-05 DIAGNOSIS — I10 Essential (primary) hypertension: Secondary | ICD-10-CM | POA: Insufficient documentation

## 2019-05-05 DIAGNOSIS — IMO0002 Reserved for concepts with insufficient information to code with codable children: Secondary | ICD-10-CM

## 2019-05-05 DIAGNOSIS — E118 Type 2 diabetes mellitus with unspecified complications: Secondary | ICD-10-CM | POA: Insufficient documentation

## 2019-05-05 DIAGNOSIS — E785 Hyperlipidemia, unspecified: Secondary | ICD-10-CM | POA: Insufficient documentation

## 2019-05-05 DIAGNOSIS — Z794 Long term (current) use of insulin: Secondary | ICD-10-CM | POA: Insufficient documentation

## 2019-05-05 DIAGNOSIS — E1165 Type 2 diabetes mellitus with hyperglycemia: Secondary | ICD-10-CM | POA: Insufficient documentation

## 2019-05-05 LAB — COMPREHENSIVE METABOLIC PANEL
ALT: 43 U/L (ref 0–44)
AST: 24 U/L (ref 15–41)
Albumin: 4 g/dL (ref 3.5–5.0)
Alkaline Phosphatase: 66 U/L (ref 38–126)
Anion gap: 13 (ref 5–15)
BUN: 9 mg/dL (ref 6–20)
CO2: 22 mmol/L (ref 22–32)
Calcium: 9.3 mg/dL (ref 8.9–10.3)
Chloride: 102 mmol/L (ref 98–111)
Creatinine, Ser: 0.69 mg/dL (ref 0.61–1.24)
GFR calc Af Amer: >60 mL/min (ref >60)
GFR calc non Af Amer: >60 mL/min (ref >60)
Glucose, Bld: 142 mg/dL — ABNORMAL HIGH (ref 70–99)
Potassium: 3.7 mmol/L (ref 3.5–5.1)
Sodium: 137 mmol/L (ref 135–145)
Total Bilirubin: 0.5 mg/dL (ref 0.3–1.2)
Total Protein: 7.3 g/dL (ref 6.5–8.1)

## 2019-05-05 LAB — LIPID PANEL
Cholesterol: 157 mg/dL (ref 0–200)
HDL: 27 mg/dL — ABNORMAL LOW (ref >40)
LDL Cholesterol: 75 mg/dL (ref 0–99)
Total CHOL/HDL Ratio: 5.8 RATIO
Triglycerides: 274 mg/dL — ABNORMAL HIGH (ref <150)
VLDL: 55 mg/dL — ABNORMAL HIGH (ref 0–40)

## 2019-05-05 LAB — HEMOGLOBIN A1C
Hgb A1c MFr Bld: 7 % — ABNORMAL HIGH (ref 4.8–5.6)
Mean Plasma Glucose: 154.2 mg/dL

## 2019-05-19 ENCOUNTER — Other Ambulatory Visit: Payer: Self-pay | Admitting: Physician Assistant

## 2019-05-30 ENCOUNTER — Ambulatory Visit: Payer: Self-pay | Admitting: Physician Assistant

## 2019-05-30 ENCOUNTER — Encounter: Payer: Self-pay | Admitting: Physician Assistant

## 2019-05-30 DIAGNOSIS — F32A Depression, unspecified: Secondary | ICD-10-CM

## 2019-05-30 DIAGNOSIS — Z794 Long term (current) use of insulin: Secondary | ICD-10-CM

## 2019-05-30 DIAGNOSIS — E118 Type 2 diabetes mellitus with unspecified complications: Secondary | ICD-10-CM

## 2019-05-30 DIAGNOSIS — E785 Hyperlipidemia, unspecified: Secondary | ICD-10-CM

## 2019-05-30 DIAGNOSIS — I10 Essential (primary) hypertension: Secondary | ICD-10-CM

## 2019-05-30 DIAGNOSIS — K219 Gastro-esophageal reflux disease without esophagitis: Secondary | ICD-10-CM

## 2019-05-30 DIAGNOSIS — F329 Major depressive disorder, single episode, unspecified: Secondary | ICD-10-CM

## 2019-05-30 DIAGNOSIS — J302 Other seasonal allergic rhinitis: Secondary | ICD-10-CM

## 2019-05-30 DIAGNOSIS — F17200 Nicotine dependence, unspecified, uncomplicated: Secondary | ICD-10-CM

## 2019-05-30 NOTE — Progress Notes (Signed)
There were no vitals taken for this visit.   Subjective:    Patient ID: Edwin Ford, male    DOB: 09-Jan-1981, 38 y.o.   MRN: [id number]  HPI: Edwin Flackddie B Meek is a 38 y.o. male presenting on 05/30/2019 for No chief complaint on file.   HPI   This is a telemedicine appointment through [employer] due to coronavirus pandemic  I connected with  Edwin FlackEddie B Roach on 05/30/19 by a video enabled telemedicine application and verified that I am speaking with the correct person using two identifiers.   I discussed the limitations of evaluation and management by telemedicine. The patient expressed understanding and agreed to proceed.   Pt is at home.  Provider is at office.    [facility]  Pt c/o runny nose.  He thinks it's allergies.  He says it's been Going on all summer.    Pt notes to have  Wheezing when he laughs. he Denies SOB  No fevers.  He continues to smoke.  He has had no covid exposures and says he doesn't go out a lot.      Relevant past medical, surgical, family and social history reviewed and updated as indicated. Interim medical history since our last visit reviewed. Allergies and medications reviewed and updated.   Current Outpatient Medications:  .  albuterol (PROVENTIL HFA) 108 (90 Base) MCG/ACT inhaler, INHALE 2 PUFFS BY MOUTH EVERY 6 HOURS AS NEEDED FOR COUGHING, WHEEZING, OR SHORTNESS OF BREATH, Disp: 20.1 g, Rfl: 0 .  ALPRAZolam (XANAX) 1 MG tablet, Take 1 tablet (1 mg total) by mouth 3 (three) times daily as needed for anxiety. (Patient taking differently: Take 2 mg by mouth 3 (three) times daily as needed for anxiety. ), Disp: 12 tablet, Rfl: 0 .  amLODipine (NORVASC) 10 MG tablet, Take 1 tablet (10 mg total) by mouth daily., Disp: 90 tablet, Rfl: 3 .  atorvastatin (LIPITOR) 20 MG tablet, TAKE 1 Tablet BY MOUTH ONCE DAILY, Disp: 90 tablet, Rfl: 0 .  clonazePAM (KLONOPIN) 1 MG tablet, Take 1 mg by mouth 3 (three) times daily as needed for  anxiety., Disp: , Rfl:  .  insulin glargine (LANTUS) 100 UNIT/ML injection, Inject 40 Units into the skin at bedtime. Reported on 04/23/2016, Disp: , Rfl:  .  JANUVIA 100 MG tablet, TAKE 1 Tablet BY MOUTH ONCE DAILY, Disp: 90 tablet, Rfl: 1 .  lisinopril (ZESTRIL) 20 MG tablet, TAKE 1 Tablet BY MOUTH ONCE DAILY, Disp: 90 tablet, Rfl: 0 .  metFORMIN (GLUCOPHAGE) 1000 MG tablet, TAKE 1 Tablet  BY MOUTH TWICE DAILY WITH MEALS, Disp: 180 tablet, Rfl: 1 .  metoprolol tartrate (LOPRESSOR) 100 MG tablet, TAKE 1 Tablet  BY MOUTH TWICE DAILY, Disp: 180 tablet, Rfl: 0 .  omeprazole (PRILOSEC) 20 MG capsule, TAKE 1 Capsule  BY MOUTH TWICE DAILY, Disp: 180 capsule, Rfl: 0 .  sertraline (ZOLOFT) 100 MG tablet, Take 2 tablets (200 mg total) by mouth daily., Disp: 60 tablet, Rfl: 0    Review of Systems  Per HPI unless specifically indicated above     Objective:    There were no vitals taken for this visit.  Wt Readings from Last 3 Encounters:  11/01/18 296 lb 8 oz (134.5 kg)  10/12/18 293 lb 8 oz (133.1 kg)  07/12/18 286 lb (129.7 kg)    Physical Exam Constitutional:      General: He is not in acute distress.    Appearance:  He is obese. He is not ill-appearing.  HENT:     Head: Normocephalic and atraumatic.  Pulmonary:     Effort: Pulmonary effort is normal. No respiratory distress.  Neurological:     Mental Status: He is alert and oriented to person, place, and time.  Psychiatric:        Attention and Perception: Attention normal.        Speech: Speech normal.        Behavior: Behavior is cooperative.     Results for orders placed or performed during the hospital encounter of 05/05/19  Lipid panel  Result Value Ref Range   Cholesterol 157 0 - 200 mg/dL   Triglycerides 274 (H) <150 mg/dL   HDL 27 (L) >40 mg/dL   Total CHOL/HDL Ratio 5.8 RATIO   VLDL 55 (H) 0 - 40 mg/dL   LDL Cholesterol 75 0 - 99 mg/dL  Comprehensive metabolic panel  Result Value Ref Range   Sodium 137 135 - 145  mmol/L   Potassium 3.7 3.5 - 5.1 mmol/L   Chloride 102 98 - 111 mmol/L   CO2 22 22 - 32 mmol/L   Glucose, Bld 142 (H) 70 - 99 mg/dL   BUN 9 6 - 20 mg/dL   Creatinine, Ser 0.69 0.61 - 1.24 mg/dL   Calcium 9.3 8.9 - 10.3 mg/dL   Total Protein 7.3 6.5 - 8.1 g/dL   Albumin 4.0 3.5 - 5.0 g/dL   AST 24 15 - 41 U/L   ALT 43 0 - 44 U/L   Alkaline Phosphatase 66 38 - 126 U/L   Total Bilirubin 0.5 0.3 - 1.2 mg/dL   GFR calc non Af Amer >60 >60 mL/min   GFR calc Af Amer >60 >60 mL/min   Anion gap 13 5 - 15  Hemoglobin A1c  Result Value Ref Range   Hgb A1c MFr Bld 7.0 (H) 4.8 - 5.6 %   Mean Plasma Glucose 154.2 mg/dL      Assessment & Plan:    Encounter Diagnoses  Name Primary?  . Type 2 diabetes mellitus with complication, with long-term current use of insulin ([place]) Yes  . Hyperlipidemia, unspecified hyperlipidemia type   . Essential hypertension, benign   . Morbid obesity ([place])   . Tobacco use disorder   . Gastroesophageal reflux disease, esophagitis presence not specified   . Depression, unspecified depression type   . Seasonal allergies     -Reviewed labs with pt -pt to Continue current medication -encouraged pt to stop smoking to help his breathing.  He can use OTC antihistamine as needed.  Discussed covid testing but he declines because he feels it is allergies -pt counseled to exercise regularly to help weight management -pt to follow up 3 months.  He is to contact office sooner prn

## 2019-08-15 ENCOUNTER — Ambulatory Visit: Payer: Self-pay | Admitting: Physician Assistant

## 2019-08-16 ENCOUNTER — Other Ambulatory Visit: Payer: Self-pay | Admitting: Physician Assistant

## 2019-08-18 ENCOUNTER — Other Ambulatory Visit: Payer: Self-pay | Admitting: Physician Assistant

## 2019-08-18 DIAGNOSIS — I10 Essential (primary) hypertension: Secondary | ICD-10-CM

## 2019-08-18 DIAGNOSIS — E118 Type 2 diabetes mellitus with unspecified complications: Secondary | ICD-10-CM

## 2019-08-18 DIAGNOSIS — Z794 Long term (current) use of insulin: Secondary | ICD-10-CM

## 2019-08-18 DIAGNOSIS — E785 Hyperlipidemia, unspecified: Secondary | ICD-10-CM

## 2019-08-31 ENCOUNTER — Encounter: Payer: Self-pay | Admitting: Physician Assistant

## 2019-08-31 ENCOUNTER — Ambulatory Visit: Payer: Self-pay | Admitting: Physician Assistant

## 2019-08-31 ENCOUNTER — Other Ambulatory Visit (HOSPITAL_COMMUNITY)
Admission: RE | Admit: 2019-08-31 | Discharge: 2019-08-31 | Disposition: A | Discharge status: Home / Self Care | Payer: Self-pay | Source: Ambulatory Visit | Attending: Physician Assistant | Admitting: Physician Assistant

## 2019-08-31 ENCOUNTER — Other Ambulatory Visit: Payer: Self-pay

## 2019-08-31 VITALS — BP 140/90 | HR 72 | Temp 97.7°F

## 2019-08-31 DIAGNOSIS — E785 Hyperlipidemia, unspecified: Secondary | ICD-10-CM | POA: Insufficient documentation

## 2019-08-31 DIAGNOSIS — I10 Essential (primary) hypertension: Secondary | ICD-10-CM

## 2019-08-31 DIAGNOSIS — E118 Type 2 diabetes mellitus with unspecified complications: Secondary | ICD-10-CM

## 2019-08-31 DIAGNOSIS — F32A Depression, unspecified: Secondary | ICD-10-CM

## 2019-08-31 DIAGNOSIS — F17200 Nicotine dependence, unspecified, uncomplicated: Secondary | ICD-10-CM

## 2019-08-31 DIAGNOSIS — Z794 Long term (current) use of insulin: Secondary | ICD-10-CM

## 2019-08-31 DIAGNOSIS — F329 Major depressive disorder, single episode, unspecified: Secondary | ICD-10-CM

## 2019-08-31 LAB — COMPREHENSIVE METABOLIC PANEL
ALT: 46 U/L — ABNORMAL HIGH (ref 0–44)
AST: 26 U/L (ref 15–41)
Albumin: 4.2 g/dL (ref 3.5–5.0)
Alkaline Phosphatase: 66 U/L (ref 38–126)
Anion gap: 9 (ref 5–15)
BUN: 14 mg/dL (ref 6–20)
CO2: 25 mmol/L (ref 22–32)
Calcium: 9.4 mg/dL (ref 8.9–10.3)
Chloride: 101 mmol/L (ref 98–111)
Creatinine, Ser: 0.82 mg/dL (ref 0.61–1.24)
GFR calc Af Amer: >60 mL/min (ref >60)
GFR calc non Af Amer: >60 mL/min (ref >60)
Glucose, Bld: 214 mg/dL — ABNORMAL HIGH (ref 70–99)
Potassium: 3.8 mmol/L (ref 3.5–5.1)
Sodium: 135 mmol/L (ref 135–145)
Total Bilirubin: 0.7 mg/dL (ref 0.3–1.2)
Total Protein: 7.8 g/dL (ref 6.5–8.1)

## 2019-08-31 LAB — HEMOGLOBIN A1C
Hgb A1c MFr Bld: 6.7 % — ABNORMAL HIGH (ref 4.8–5.6)
Mean Plasma Glucose: 145.59 mg/dL

## 2019-08-31 LAB — LIPID PANEL
Cholesterol: 148 mg/dL (ref 0–200)
HDL: 24 mg/dL — ABNORMAL LOW (ref >40)
LDL Cholesterol: 82 mg/dL (ref 0–99)
Total CHOL/HDL Ratio: 6.2 RATIO
Triglycerides: 212 mg/dL — ABNORMAL HIGH (ref <150)
VLDL: 42 mg/dL — ABNORMAL HIGH (ref 0–40)

## 2019-08-31 MED ORDER — AMLODIPINE BESYLATE 10 MG PO TABS: 10.0000 mg | ORAL_TABLET | Freq: Every day | ORAL | 3 refills | Status: DC

## 2019-08-31 MED ORDER — LISINOPRIL 40 MG PO TABS: 40.0000 mg | ORAL_TABLET | Freq: Every day | ORAL | 1 refills | Status: DC

## 2019-08-31 NOTE — Progress Notes (Signed)
BP 140/90   Pulse 72   Temp 97.7 F (36.5 C)   SpO2 97%    Subjective:    Patient ID: Edwin Ford, male    DOB: Mar 26, 1981, 38 y.o.   MRN: [id number]  HPI: Edwin Ford is a 38 y.o. male presenting on 08/31/2019 for Diabetes, Hypertension, and Hyperlipidemia   HPI  Pt had a negative covid 19 screening questionnaire     Pt haivng some neuroathy.  He was on gabapenint in the past but stopped it when [facility] discontinued it  Patient denies any other problems.   Relevant past medical, surgical, family and social history reviewed and updated as indicated. Interim medical history since our last visit reviewed. Allergies and medications reviewed and updated.  CURRENT MEDS: Albuterol MDI prn Xanax prn Amlodipine 10mg  qd lipitor 20mg  qd Klonopin 1mg  tid lantus 40u qd januvia 100mg  qd Lisinopril 20mg  qd Metformin 1g bid Metoprolol 100mg  bid Omeprazole 20mg  bid zoloft 250mg  daily    Review of Systems  Per HPI unless specifically indicated above     Objective:    BP 140/90   Pulse 72   Temp 97.7 F (36.5 C)   SpO2 97%   Wt Readings from Last 3 Encounters:  11/01/18 296 lb 8 oz (134.5 kg)  10/12/18 293 lb 8 oz (133.1 kg)  07/12/18 286 lb (129.7 kg)    Physical Exam Vitals signs reviewed.  Constitutional:      General: He is not in acute distress.    Appearance: Normal appearance. He is well-developed. He is obese. He is not ill-appearing.  HENT:     Head: Normocephalic and atraumatic.  Neck:     Musculoskeletal: Neck supple.  Cardiovascular:     Rate and Rhythm: Normal rate and regular rhythm.  Pulmonary:     Effort: Pulmonary effort is normal.     Breath sounds: Normal breath sounds. No wheezing.  Abdominal:     General: Bowel sounds are normal.     Palpations: Abdomen is soft.     Tenderness: There is no abdominal tenderness.  Musculoskeletal:     Right lower leg: No edema.     Left lower leg: No edema.  Lymphadenopathy:     Cervical:  No cervical adenopathy.  Skin:    General: Skin is warm and dry.  Neurological:     Mental Status: He is alert and oriented to person, place, and time.  Psychiatric:        Attention and Perception: Attention normal.        Speech: Speech normal.        Behavior: Behavior normal. Behavior is cooperative.     Results for orders placed or performed during the hospital encounter of 08/31/19  Lipid panel  Result Value Ref Range   Cholesterol 148 0 - 200 mg/dL   Triglycerides (H) <150 mg/dL   HDL 24 (L) mg/dL   Total CHOL/HDL Ratio 6.2 RATIO   VLDL 42 (H) 0 - 40 mg/dL   LDL Cholesterol 82 0 - 99 mg/dL  Comprehensive metabolic panel  Result Value Ref Range   Sodium 135 135 - 145 mmol/L   Potassium 3.8 3.5 - 5.1 mmol/L   Chloride 101 98 - 111 mmol/L   CO2 25 22 - 32 mmol/L   Glucose, Bld 214 (H) 70 - 99 mg/dL   BUN 14 6 - 20 mg/dL   Creatinine, Ser 0.61 - 1.24 mg/dL   Calcium 9.4 8.9 -  10.3 mg/dL   Total Protein 7.8 6.5 - 8.1 g/dL   Albumin 4.2 3.5 - 5.0 g/dL   AST 26 15 - 41 U/L   ALT 46 (H) 0 - 44 U/L   Alkaline Phosphatase 66 38 - 126 U/L   Total Bilirubin 0.7 0.3 - 1.2 mg/dL   GFR calc non Af Amer >60 >60 mL/min   GFR calc Af Amer >60 >60 mL/min   Anion gap 9 5 - 15      Assessment & Plan:   Encounter Diagnoses  Name Primary?  . Type 2 diabetes mellitus with complication, with long-term current use of insulin ([place]) Yes  . Hyperlipidemia, unspecified hyperlipidemia type   . Essential hypertension, benign   . Morbid obesity ([place])   . Tobacco use disorder   . Depression, unspecified depression type      -Reviewed labs with patient.  A1c is still pending since he just had his blood drawn this morning.  Will call patient with those results when available.  -Will increase lisinopril to 40mg  daily  -He already got flu shot this season  -Encouraged smoking cessation.  -Diabetic foot exam is updated today.  -Patient is encouraged to wear a mask when  in public per CDC guidelines to reduce risk of COVID-19 transmission.  -Patient to follow-up in 4 months.  He is to contact office sooner for any problems.

## 2019-09-01 LAB — MICROALBUMIN, URINE: Microalb, Ur: 68 ug/mL — ABNORMAL HIGH

## 2019-10-06 ENCOUNTER — Encounter: Payer: Self-pay | Admitting: Student

## 2019-10-12 ENCOUNTER — Other Ambulatory Visit: Payer: Self-pay | Admitting: Physician Assistant

## 2019-12-28 ENCOUNTER — Other Ambulatory Visit (HOSPITAL_COMMUNITY)
Admission: RE | Admit: 2019-12-28 | Discharge: 2019-12-28 | Disposition: A | Discharge status: Home / Self Care | Payer: Self-pay | Source: Ambulatory Visit | Attending: Physician Assistant | Admitting: Physician Assistant

## 2019-12-28 DIAGNOSIS — E785 Hyperlipidemia, unspecified: Secondary | ICD-10-CM | POA: Insufficient documentation

## 2019-12-28 DIAGNOSIS — I10 Essential (primary) hypertension: Secondary | ICD-10-CM | POA: Insufficient documentation

## 2019-12-28 DIAGNOSIS — E118 Type 2 diabetes mellitus with unspecified complications: Secondary | ICD-10-CM | POA: Insufficient documentation

## 2019-12-28 DIAGNOSIS — Z794 Long term (current) use of insulin: Secondary | ICD-10-CM | POA: Insufficient documentation

## 2019-12-28 LAB — COMPREHENSIVE METABOLIC PANEL
ALT: 54 U/L — ABNORMAL HIGH (ref 0–44)
AST: 29 U/L (ref 15–41)
Albumin: 4.3 g/dL (ref 3.5–5.0)
Alkaline Phosphatase: 68 U/L (ref 38–126)
Anion gap: 12 (ref 5–15)
BUN: 10 mg/dL (ref 6–20)
CO2: 25 mmol/L (ref 22–32)
Calcium: 9.3 mg/dL (ref 8.9–10.3)
Chloride: 100 mmol/L (ref 98–111)
Creatinine, Ser: 0.75 mg/dL (ref 0.61–1.24)
GFR calc Af Amer: >60 mL/min (ref >60)
GFR calc non Af Amer: >60 mL/min (ref >60)
Glucose, Bld: 99 mg/dL (ref 70–99)
Potassium: 4.1 mmol/L (ref 3.5–5.1)
Sodium: 137 mmol/L (ref 135–145)
Total Bilirubin: 0.7 mg/dL (ref 0.3–1.2)
Total Protein: 7.7 g/dL (ref 6.5–8.1)

## 2019-12-28 LAB — LIPID PANEL
Cholesterol: 152 mg/dL (ref 0–200)
HDL: 31 mg/dL — ABNORMAL LOW (ref >40)
LDL Cholesterol: 87 mg/dL (ref 0–99)
Total CHOL/HDL Ratio: 4.9 RATIO
Triglycerides: 172 mg/dL — ABNORMAL HIGH (ref <150)
VLDL: 34 mg/dL (ref 0–40)

## 2019-12-29 LAB — HEMOGLOBIN A1C
Hgb A1c MFr Bld: 6.7 % — ABNORMAL HIGH (ref 4.8–5.6)
Mean Plasma Glucose: 146 mg/dL

## 2020-01-02 ENCOUNTER — Other Ambulatory Visit: Payer: Self-pay

## 2020-01-02 ENCOUNTER — Ambulatory Visit: Payer: Self-pay | Admitting: Physician Assistant

## 2020-01-02 ENCOUNTER — Encounter: Payer: Self-pay | Admitting: Physician Assistant

## 2020-01-02 VITALS — BP 136/90 | HR 74 | Temp 97.2°F | Wt 297.0 lb

## 2020-01-02 DIAGNOSIS — F32A Depression, unspecified: Secondary | ICD-10-CM

## 2020-01-02 DIAGNOSIS — E118 Type 2 diabetes mellitus with unspecified complications: Secondary | ICD-10-CM

## 2020-01-02 DIAGNOSIS — E785 Hyperlipidemia, unspecified: Secondary | ICD-10-CM

## 2020-01-02 DIAGNOSIS — I10 Essential (primary) hypertension: Secondary | ICD-10-CM

## 2020-01-02 DIAGNOSIS — F17200 Nicotine dependence, unspecified, uncomplicated: Secondary | ICD-10-CM

## 2020-01-02 DIAGNOSIS — F329 Major depressive disorder, single episode, unspecified: Secondary | ICD-10-CM

## 2020-01-02 NOTE — Progress Notes (Signed)
BP 136/90   Pulse 74   Temp (!) 97.2 F (36.2 C)   Wt 297 lb (134.7 kg)   SpO2 96%   BMI 40.28 kg/m    Subjective:    Patient ID: Edwin Ford, male    DOB: 07/31/1981, 39 y.o.   MRN: [id number]  HPI: OBE Edwin Ford is a 40 y.o. male presenting on 01/02/2020 for Diabetes, Hypertension, and Hyperlipidemia   HPI   Pt had a negative covid 19 screening questionnaire.    Pt is 38yoM with DM, HTN and dyslipidemia.   He is still going to dr Evelene Croon still for [facility] issues Pt says he is doing fine and he has no complaints today.   Relevant past medical, surgical, family and social history reviewed and updated as indicated. Interim medical history since our last visit reviewed. Allergies and medications reviewed and updated.   Current Outpatient Medications:  .  albuterol (PROVENTIL HFA) 108 (90 Base) MCG/ACT inhaler, INHALE 2 PUFFS BY MOUTH EVERY 6 HOURS AS NEEDED FOR COUGHING, WHEEZING, OR SHORTNESS OF BREATH, Disp: 20.1 g, Rfl: 0 .  ALPRAZolam (XANAX) 1 MG tablet, Take 1 tablet (1 mg total) by mouth 3 (three) times daily as needed for anxiety. (Patient taking differently: Take 2 mg by mouth 3 (three) times daily as needed for anxiety. ), Disp: 12 tablet, Rfl: 0 .  amLODipine (NORVASC) 10 MG tablet, Take 1 tablet (10 mg total) by mouth daily., Disp: 90 tablet, Rfl: 3 .  atorvastatin (LIPITOR) 20 MG tablet, TAKE 1 Tablet BY MOUTH ONCE DAILY, Disp: 90 tablet, Rfl: 0 .  clonazePAM (KLONOPIN) 1 MG tablet, Take 1 mg by mouth 3 (three) times daily as needed for anxiety., Disp: , Rfl:  .  insulin glargine (LANTUS) 100 UNIT/ML injection, Inject 40 Units into the skin at bedtime. Reported on 04/23/2016, Disp: , Rfl:  .  JANUVIA 100 MG tablet, TAKE 1 Tablet BY MOUTH ONCE DAILY, Disp: 90 tablet, Rfl: 1 .  lisinopril (ZESTRIL) 40 MG tablet, Take 1 tablet (40 mg total) by mouth daily., Disp: 90 tablet, Rfl: 1 .  metFORMIN (GLUCOPHAGE) 1000 MG tablet, TAKE 1 Tablet  BY MOUTH TWICE DAILY WITH MEALS,  Disp: 180 tablet, Rfl: 1 .  metoprolol tartrate (LOPRESSOR) 100 MG tablet, TAKE 1 Tablet  BY MOUTH TWICE DAILY, Disp: 180 tablet, Rfl: 0 .  omeprazole (PRILOSEC) 20 MG capsule, TAKE 1 Capsule  BY MOUTH TWICE DAILY, Disp: 180 capsule, Rfl: 0 .  sertraline (ZOLOFT) 100 MG tablet, Take 2 tablets (200 mg total) by mouth daily., Disp: 60 tablet, Rfl: 0 .  sertraline (ZOLOFT) 50 MG tablet, Take 50 mg by mouth daily., Disp: , Rfl:     Review of Systems  Per HPI unless specifically indicated above     Objective:    BP 136/90   Pulse 74   Temp (!) 97.2 F (36.2 C)   Wt 297 lb (134.7 kg)   SpO2 96%   BMI 40.28 kg/m   Wt Readings from Last 3 Encounters:  01/02/20 297 lb (134.7 kg)  11/01/18 296 lb 8 oz (134.5 kg)  10/12/18 293 lb 8 oz (133.1 kg)    Physical Exam Vitals reviewed.  Constitutional:      General: He is not in acute distress.    Appearance: He is well-developed. He is obese. He is not ill-appearing.  HENT:     Head: Normocephalic and atraumatic.  Cardiovascular:     Rate and Rhythm: Normal rate and  regular rhythm.  Pulmonary:     Effort: Pulmonary effort is normal.     Breath sounds: Normal breath sounds. No wheezing.  Abdominal:     General: Bowel sounds are normal.     Palpations: Abdomen is soft.     Tenderness: There is no abdominal tenderness.  Musculoskeletal:     Cervical back: Neck supple.     Right lower leg: No edema.     Left lower leg: No edema.  Lymphadenopathy:     Cervical: No cervical adenopathy.  Skin:    General: Skin is warm and dry.  Neurological:     Mental Status: He is alert and oriented to person, place, and time.  Psychiatric:        Attention and Perception: Attention normal.        Mood and Affect: Mood normal.        Speech: Speech normal.        Behavior: Behavior normal. Behavior is cooperative.     Results for orders placed or performed during the hospital encounter of 12/28/19  Lipid panel  Result Value Ref Range    Cholesterol 152 0 - 200 mg/dL   Triglycerides 172 (H) <150 mg/dL   HDL 31 (L) >40 mg/dL   Total CHOL/HDL Ratio 4.9 RATIO   VLDL 34 0 - 40 mg/dL   LDL Cholesterol 87 0 - 99 mg/dL  Comprehensive metabolic panel  Result Value Ref Range   Sodium 137 135 - 145 mmol/L   Potassium 4.1 3.5 - 5.1 mmol/L   Chloride 100 98 - 111 mmol/L   CO2 25 22 - 32 mmol/L   Glucose, Bld 99 70 - 99 mg/dL   BUN 10 6 - 20 mg/dL   Creatinine, Ser 0.75 0.61 - 1.24 mg/dL   Calcium 9.3 8.9 - 10.3 mg/dL   Total Protein 7.7 6.5 - 8.1 g/dL   Albumin 4.3 3.5 - 5.0 g/dL   AST 29 15 - 41 U/L   ALT 54 (H) 0 - 44 U/L   Alkaline Phosphatase 68 38 - 126 U/L   Total Bilirubin 0.7 0.3 - 1.2 mg/dL   GFR calc non Af Amer >60 >60 mL/min   GFR calc Af Amer >60 >60 mL/min   Anion gap 12 5 - 15  Hemoglobin A1c  Result Value Ref Range   Hgb A1c MFr Bld 6.7 (H) 4.8 - 5.6 %   Mean Plasma Glucose 146 mg/dL      Assessment & Plan:    Encounter Diagnoses  Name Primary?  . Type 2 diabetes mellitus with complication, with long-term current use of insulin ([place]) Yes  . Essential hypertension, benign   . Hyperlipidemia, unspecified hyperlipidemia type   . Morbid obesity ([place])   . Tobacco use disorder   . Depression, unspecified depression type      -reviewed labs with pt -pt to continue current medications -pt encouraged on smoking cessation -pt to continue with Dr [facility] for [facility] issues -pt to follow up 3 months.  He is to contact office sooner prn

## 2020-01-31 ENCOUNTER — Ambulatory Visit: Payer: Self-pay | Admitting: Physician Assistant

## 2020-02-10 ENCOUNTER — Other Ambulatory Visit: Payer: Self-pay | Admitting: Physician Assistant

## 2020-03-19 ENCOUNTER — Other Ambulatory Visit: Payer: Self-pay | Admitting: Physician Assistant

## 2020-04-26 ENCOUNTER — Other Ambulatory Visit (HOSPITAL_COMMUNITY)
Admission: RE | Admit: 2020-04-26 | Discharge: 2020-04-26 | Disposition: A | Discharge status: Home / Self Care | Payer: Self-pay | Source: Ambulatory Visit | Attending: Physician Assistant | Admitting: Physician Assistant

## 2020-04-26 ENCOUNTER — Other Ambulatory Visit: Payer: Self-pay

## 2020-04-26 DIAGNOSIS — E785 Hyperlipidemia, unspecified: Secondary | ICD-10-CM | POA: Insufficient documentation

## 2020-04-26 DIAGNOSIS — I10 Essential (primary) hypertension: Secondary | ICD-10-CM | POA: Insufficient documentation

## 2020-04-26 DIAGNOSIS — Z794 Long term (current) use of insulin: Secondary | ICD-10-CM | POA: Insufficient documentation

## 2020-04-26 DIAGNOSIS — E118 Type 2 diabetes mellitus with unspecified complications: Secondary | ICD-10-CM | POA: Insufficient documentation

## 2020-04-26 LAB — COMPREHENSIVE METABOLIC PANEL
ALT: 25 U/L (ref 0–44)
AST: 21 U/L (ref 15–41)
Albumin: 4.3 g/dL (ref 3.5–5.0)
Alkaline Phosphatase: 80 U/L (ref 38–126)
Anion gap: 13 (ref 5–15)
BUN: 15 mg/dL (ref 6–20)
CO2: 26 mmol/L (ref 22–32)
Calcium: 9.5 mg/dL (ref 8.9–10.3)
Chloride: 99 mmol/L (ref 98–111)
Creatinine, Ser: 0.8 mg/dL (ref 0.61–1.24)
GFR calc Af Amer: >60 mL/min (ref >60)
GFR calc non Af Amer: >60 mL/min (ref >60)
Glucose, Bld: 160 mg/dL — ABNORMAL HIGH (ref 70–99)
Potassium: 3.6 mmol/L (ref 3.5–5.1)
Sodium: 138 mmol/L (ref 135–145)
Total Bilirubin: 0.5 mg/dL (ref 0.3–1.2)
Total Protein: 7.3 g/dL (ref 6.5–8.1)

## 2020-04-26 LAB — HEMOGLOBIN A1C
Hgb A1c MFr Bld: 6.5 % — ABNORMAL HIGH (ref 4.8–5.6)
Mean Plasma Glucose: 139.85 mg/dL

## 2020-04-26 LAB — LIPID PANEL
Cholesterol: 154 mg/dL (ref 0–200)
HDL: 25 mg/dL — ABNORMAL LOW (ref >40)
LDL Cholesterol: 69 mg/dL (ref 0–99)
Total CHOL/HDL Ratio: 6.2 RATIO
Triglycerides: 299 mg/dL — ABNORMAL HIGH (ref <150)
VLDL: 60 mg/dL — ABNORMAL HIGH (ref 0–40)

## 2020-05-01 ENCOUNTER — Encounter: Payer: Self-pay | Admitting: Physician Assistant

## 2020-05-01 ENCOUNTER — Ambulatory Visit: Payer: Self-pay | Admitting: Physician Assistant

## 2020-05-01 DIAGNOSIS — E785 Hyperlipidemia, unspecified: Secondary | ICD-10-CM

## 2020-05-01 DIAGNOSIS — Z794 Long term (current) use of insulin: Secondary | ICD-10-CM

## 2020-05-01 DIAGNOSIS — E118 Type 2 diabetes mellitus with unspecified complications: Secondary | ICD-10-CM

## 2020-05-01 DIAGNOSIS — F32A Depression, unspecified: Secondary | ICD-10-CM

## 2020-05-01 DIAGNOSIS — I10 Essential (primary) hypertension: Secondary | ICD-10-CM

## 2020-05-01 DIAGNOSIS — F17200 Nicotine dependence, unspecified, uncomplicated: Secondary | ICD-10-CM

## 2020-05-01 NOTE — Progress Notes (Signed)
There were no vitals taken for this visit.   Subjective:    Patient ID: Edwin Ford, male    DOB: 01-Oct-1981, 39 y.o.   MRN: [id number]  HPI: Edwin Ford is a 39 y.o. male presenting on 05/01/2020 for No chief complaint on file.   HPI  This is a telemedicine appointment due to coronavirus pandemic.  It is via Telephone as pt does not have a video enabled device.  I connected with  Edwin Ford on 05/01/20 by a video enabled telemedicine application and verified that I am speaking with the correct person using two identifiers.   I discussed the limitations of evaluation and management by telemedicine. The patient expressed understanding and agreed to proceed.  Pt is at home.  Provider is at office.    Pt is 53yoM with DM, HTN and dyslipidemia and MH issues Pt says he is doing well and he has no complaints today.  He Hasn't gotten covid vaccination    Relevant past medical, surgical, family and social history reviewed and updated as indicated. Interim medical history since our last visit reviewed. Allergies and medications reviewed and updated.   Current Outpatient Medications:  .  albuterol (PROVENTIL HFA) 108 (90 Base) MCG/ACT inhaler, INHALE 2 PUFFS BY MOUTH EVERY 6 HOURS AS NEEDED FOR COUGHING, WHEEZING, OR SHORTNESS OF BREATH, Disp: 20.1 g, Rfl: 0 .  ALPRAZolam (XANAX) 1 MG tablet, Take 1 tablet (1 mg total) by mouth 3 (three) times daily as needed for anxiety. (Patient taking differently: Take 2 mg by mouth 3 (three) times daily as needed for anxiety. ), Disp: 12 tablet, Rfl: 0 .  amLODipine (NORVASC) 10 MG tablet, Take 1 tablet (10 mg total) by mouth daily., Disp: 90 tablet, Rfl: 3 .  atorvastatin (LIPITOR) 20 MG tablet, TAKE 1 Tablet BY MOUTH ONCE DAILY, Disp: 90 tablet, Rfl: 0 .  clonazePAM (KLONOPIN) 1 MG tablet, Take 1 mg by mouth 3 (three) times daily as needed for anxiety., Disp: , Rfl:  .  insulin glargine (LANTUS) 100 UNIT/ML injection, Inject 40 Units  into the skin at bedtime. Reported on 04/23/2016, Disp: , Rfl:  .  JANUVIA 100 MG tablet, TAKE 1 Tablet BY MOUTH ONCE DAILY, Disp: 90 tablet, Rfl: 1 .  lisinopril (ZESTRIL) 40 MG tablet, TAKE 1 Tablet BY MOUTH ONCE DAILY, Disp: 90 tablet, Rfl: 1 .  metFORMIN (GLUCOPHAGE) 1000 MG tablet, TAKE 1 Tablet  BY MOUTH TWICE DAILY WITH MEALS, Disp: 180 tablet, Rfl: 1 .  metoprolol tartrate (LOPRESSOR) 100 MG tablet, TAKE 1 Tablet  BY MOUTH TWICE DAILY, Disp: 180 tablet, Rfl: 0 .  omeprazole (PRILOSEC) 20 MG capsule, TAKE 1 Capsule  BY MOUTH TWICE DAILY, Disp: 180 capsule, Rfl: 0 .  sertraline (ZOLOFT) 100 MG tablet, Take 2 tablets (200 mg total) by mouth daily., Disp: 60 tablet, Rfl: 0 .  sertraline (ZOLOFT) 50 MG tablet, Take 50 mg by mouth daily., Disp: , Rfl:     Review of Systems  Per HPI unless specifically indicated above     Objective:    There were no vitals taken for this visit.  Wt Readings from Last 3 Encounters:  01/02/20 297 lb (134.7 kg)  11/01/18 296 lb 8 oz (134.5 kg)  10/12/18 293 lb 8 oz (133.1 kg)    Physical Exam Pulmonary:     Effort: No respiratory distress.  Neurological:     Mental Status: He is alert and oriented to person, place, and time.  Psychiatric:  Attention and Perception: Attention normal.        Mood and Affect: Mood normal.        Speech: Speech normal.        Behavior: Behavior is cooperative.     Results for orders placed or performed during the hospital encounter of 04/26/20  Hemoglobin A1c  Result Value Ref Range   Hgb A1c MFr Bld 6.5 (H) 4.8 - 5.6 %   Mean Plasma Glucose 139.85 mg/dL  Lipid panel  Result Value Ref Range   Cholesterol 154 0 - 200 mg/dL   Triglycerides 353 (H) <150 mg/dL   HDL 25 (L) >61 mg/dL   Total CHOL/HDL Ratio 6.2 RATIO   VLDL 60 (H) 0 - 40 mg/dL   LDL Cholesterol 69 0 - 99 mg/dL  Comprehensive metabolic panel  Result Value Ref Range   Sodium 138 135 - 145 mmol/L   Potassium 3.6 3.5 - 5.1 mmol/L   Chloride  99 98 - 111 mmol/L   CO2 26 22 - 32 mmol/L   Glucose, Bld 160 (H) 70 - 99 mg/dL   BUN 15 6 - 20 mg/dL   Creatinine, Ser 4.43 0.61 - 1.24 mg/dL   Calcium 9.5 8.9 - 15.4 mg/dL   Total Protein 7.3 6.5 - 8.1 g/dL   Albumin 4.3 3.5 - 5.0 g/dL   AST 21 15 - 41 U/L   ALT 25 0 - 44 U/L   Alkaline Phosphatase 80 38 - 126 U/L   Total Bilirubin 0.5 0.3 - 1.2 mg/dL   GFR calc non Af Amer >60 >60 mL/min   GFR calc Af Amer >60 >60 mL/min   Anion gap 13 5 - 15      Assessment & Plan:    Encounter Diagnoses  Name Primary?  . Type 2 diabetes mellitus with complication, with long-term current use of insulin ([facility]) Yes  . Essential hypertension, benign   . Hyperlipidemia, unspecified hyperlipidemia type   . Tobacco use disorder   . Depression, unspecified depression type   . Morbid obesity ([facility])       -reviewed labs with pt -pt to Contine current medications -discussed covid and vaccine and encouraged pt to Get vaccniated -pt to follow up in office in  4 months.  He is to contact office sooner prn

## 2020-07-30 ENCOUNTER — Ambulatory Visit: Payer: Self-pay | Admitting: Physician Assistant

## 2020-08-06 ENCOUNTER — Other Ambulatory Visit: Payer: Self-pay | Admitting: Physician Assistant

## 2020-09-03 ENCOUNTER — Other Ambulatory Visit: Payer: Self-pay

## 2020-09-03 ENCOUNTER — Ambulatory Visit: Payer: Self-pay | Admitting: Physician Assistant

## 2020-09-03 ENCOUNTER — Encounter: Payer: Self-pay | Admitting: Physician Assistant

## 2020-09-03 VITALS — BP 122/73 | HR 69 | Temp 98.0°F

## 2020-09-03 DIAGNOSIS — F32A Depression, unspecified: Secondary | ICD-10-CM

## 2020-09-03 DIAGNOSIS — F17200 Nicotine dependence, unspecified, uncomplicated: Secondary | ICD-10-CM

## 2020-09-03 DIAGNOSIS — E118 Type 2 diabetes mellitus with unspecified complications: Secondary | ICD-10-CM

## 2020-09-03 DIAGNOSIS — R059 Cough, unspecified: Secondary | ICD-10-CM

## 2020-09-03 DIAGNOSIS — Z794 Long term (current) use of insulin: Secondary | ICD-10-CM

## 2020-09-03 DIAGNOSIS — E785 Hyperlipidemia, unspecified: Secondary | ICD-10-CM

## 2020-09-03 DIAGNOSIS — I10 Essential (primary) hypertension: Secondary | ICD-10-CM

## 2020-09-03 MED ORDER — AMLODIPINE BESYLATE 10 MG PO TABS: 10.0000 mg | ORAL_TABLET | Freq: Every day | ORAL | 0 refills | Status: DC

## 2020-09-03 NOTE — Progress Notes (Signed)
BP 122/73    Pulse 69    Temp 98 F (36.7 C)    SpO2 95%    Subjective:    Patient ID: Edwin Ford, male    DOB: 1981-08-26, 38 y.o.   MRN: [id number]  HPI: Edwin Ford is a 39 y.o. male presenting on 09/03/2020 for No chief complaint on file.   HPI  Pt had a negative covid 19 screening questionnaire.    Pt is a 39yoM who present for routine follow up of DM, HTN and dyslipidemia.  Pt did not get his labs drawn because he says he only heard the message on Saturday.  He has recently moved. He also has had relationship/significant other change.  Pt says he is feeling better mentally with these changes.   Additionally, he now has a job and has Aeronautical engineer.  Pt got covid vaccination but doesn't have his card with him.  He states he has Chronic cough, over a year.  He states no recent changes in the cough.  He continues to smoke.  He uses his inhaler about once/week.     Relevant past medical, surgical, family and social history reviewed and updated as indicated. Interim medical history since our last visit reviewed. Allergies and medications reviewed and updated.   Current Outpatient Medications:    albuterol (PROVENTIL HFA) 108 (90 Base) MCG/ACT inhaler, INHALE 2 PUFFS BY MOUTH EVERY 6 HOURS AS NEEDED FOR COUGHING, WHEEZING, OR SHORTNESS OF BREATH, Disp: 20.1 g, Rfl: 0   ALPRAZolam (XANAX) 1 MG tablet, Take 1 tablet (1 mg total) by mouth 3 (three) times daily as needed for anxiety. (Patient taking differently: Take 2 mg by mouth 3 (three) times daily as needed for anxiety. ), Disp: 12 tablet, Rfl: 0   amLODipine (NORVASC) 10 MG tablet, Take 1 tablet (10 mg total) by mouth daily., Disp: 90 tablet, Rfl: 0   atorvastatin (LIPITOR) 20 MG tablet, TAKE 1 Tablet BY MOUTH ONCE DAILY, Disp: 90 tablet, Rfl: 0   clonazePAM (KLONOPIN) 1 MG tablet, Take 1 mg by mouth 3 (three) times daily as needed for anxiety., Disp: , Rfl:    insulin glargine (LANTUS) 100 UNIT/ML injection,  Inject 40 Units into the skin at bedtime. Reported on 04/23/2016, Disp: , Rfl:    JANUVIA 100 MG tablet, TAKE 1 Tablet BY MOUTH ONCE EVERY DAY, Disp: 90 tablet, Rfl: 0   lisinopril (ZESTRIL) 40 MG tablet, TAKE 1 Tablet BY MOUTH ONCE DAILY, Disp: 90 tablet, Rfl: 0   metFORMIN (GLUCOPHAGE) 1000 MG tablet, TAKE 1 Tablet  BY MOUTH TWICE DAILY WITH MEALS, Disp: 180 tablet, Rfl: 0   metoprolol tartrate (LOPRESSOR) 100 MG tablet, TAKE 1 Tablet  BY MOUTH TWICE DAILY, Disp: 180 tablet, Rfl: 0   omeprazole (PRILOSEC) 20 MG capsule, TAKE 1 Capsule  BY MOUTH TWICE DAILY, Disp: 180 capsule, Rfl: 0   sertraline (ZOLOFT) 100 MG tablet, Take 2 tablets (200 mg total) by mouth daily., Disp: 60 tablet, Rfl: 0   sertraline (ZOLOFT) 50 MG tablet, Take 50 mg by mouth daily., Disp: , Rfl:     Review of Systems  Per HPI unless specifically indicated above     Objective:    BP 122/73    Pulse 69    Temp 98 F (36.7 C)    SpO2 95%   Wt Readings from Last 3 Encounters:  01/02/20 297 lb (134.7 kg)  11/01/18 296 lb 8 oz (134.5 kg)  10/12/18 293 lb 8 oz (  133.1 kg)    Physical Exam Vitals reviewed.  Constitutional:      General: He is not in acute distress.    Appearance: He is well-developed. He is not ill-appearing.  HENT:     Head: Normocephalic and atraumatic.  Cardiovascular:     Rate and Rhythm: Normal rate and regular rhythm.  Pulmonary:     Effort: Pulmonary effort is normal. No respiratory distress.     Breath sounds: Wheezing present.     Comments: occasional soft expiratory wheeze.  Abdominal:     General: Bowel sounds are normal.     Palpations: Abdomen is soft.     Tenderness: There is no abdominal tenderness.  Musculoskeletal:     Cervical back: Neck supple.     Right lower leg: No edema.     Left lower leg: No edema.  Lymphadenopathy:     Cervical: No cervical adenopathy.  Skin:    General: Skin is warm and dry.  Neurological:     Mental Status: He is alert and oriented to  person, place, and time.  Psychiatric:        Attention and Perception: Attention normal.        Speech: Speech normal.        Behavior: Behavior normal. Behavior is cooperative.             Assessment & Plan:    Encounter Diagnoses  Name Primary?   Type 2 diabetes mellitus with complication, with long-term current use of insulin ([facility]) Yes   Essential hypertension, benign    Hyperlipidemia, unspecified hyperlipidemia type    Tobacco use disorder    Depression, unspecified depression type    Morbid obesity ([facility])    Cough       -discussed getting CXR in light of persistent cough.  This will be for pt's new provider to obtain since he now has insurance.  Pt has inhaler to use prn -discussed with pt that his labs are due for updating but since he has insurance now, that will be for his new PCP.   -pt says he has all of his medications and isn't about to run out as he recently got a shipment from [employer]. -pt is also due for diabetic foot exam -encouraged smoking cessation -recommended pt take a picture of his covid card so he will always have it with him (in his phone) -pt to continue with his current medications and get established with new PCP.  He states understanding.

## 2020-10-29 ENCOUNTER — Other Ambulatory Visit: Payer: Self-pay | Admitting: Physician Assistant

## 2020-11-05 ENCOUNTER — Ambulatory Visit: Payer: Self-pay | Admitting: Physician Assistant

## 2020-11-05 ENCOUNTER — Encounter: Payer: Self-pay | Admitting: Physician Assistant

## 2020-11-05 VITALS — BP 140/86 | HR 74 | Temp 96.1°F | Ht 71.5 in | Wt 254.4 lb

## 2020-11-05 DIAGNOSIS — F32A Depression, unspecified: Secondary | ICD-10-CM

## 2020-11-05 DIAGNOSIS — F17200 Nicotine dependence, unspecified, uncomplicated: Secondary | ICD-10-CM

## 2020-11-05 DIAGNOSIS — E118 Type 2 diabetes mellitus with unspecified complications: Secondary | ICD-10-CM

## 2020-11-05 DIAGNOSIS — E785 Hyperlipidemia, unspecified: Secondary | ICD-10-CM

## 2020-11-05 DIAGNOSIS — E669 Obesity, unspecified: Secondary | ICD-10-CM

## 2020-11-05 DIAGNOSIS — R059 Cough, unspecified: Secondary | ICD-10-CM

## 2020-11-05 DIAGNOSIS — I10 Essential (primary) hypertension: Secondary | ICD-10-CM

## 2020-11-05 MED ORDER — METOPROLOL TARTRATE 100 MG PO TABS: 100.0000 mg | ORAL_TABLET | Freq: Two times a day (BID) | ORAL | 0 refills | Status: DC

## 2020-11-05 MED ORDER — LISINOPRIL 40 MG PO TABS: 40.0000 mg | ORAL_TABLET | Freq: Every day | ORAL | 0 refills | Status: DC

## 2020-11-05 MED ORDER — AMLODIPINE BESYLATE 10 MG PO TABS: 10.0000 mg | ORAL_TABLET | Freq: Every day | ORAL | 0 refills | Status: DC

## 2020-11-05 MED ORDER — METFORMIN HCL 1000 MG PO TABS: 1000.0000 mg | ORAL_TABLET | Freq: Two times a day (BID) | ORAL | 0 refills | Status: DC

## 2020-11-05 MED ORDER — SITAGLIPTIN PHOSPHATE 100 MG PO TABS: ORAL_TABLET | ORAL | 0 refills | Status: DC

## 2020-11-05 MED ORDER — ALBUTEROL SULFATE HFA 108 (90 BASE) MCG/ACT IN AERS: INHALATION_SPRAY | RESPIRATORY_TRACT | 0 refills | Status: DC

## 2020-11-05 MED ORDER — ATORVASTATIN CALCIUM 20 MG PO TABS: 20.0000 mg | ORAL_TABLET | Freq: Every day | ORAL | 0 refills | Status: DC

## 2020-11-05 MED ORDER — OMEPRAZOLE 20 MG PO CPDR: 20.0000 mg | DELAYED_RELEASE_CAPSULE | Freq: Two times a day (BID) | ORAL | 0 refills | Status: DC

## 2020-11-05 NOTE — Progress Notes (Signed)
BP 140/86   Pulse 74   Temp (!) 96.1 F (35.6 C)   Ht 5' 11.5" (1.816 m)   Wt 254 lb 6.4 oz (115.4 kg)   SpO2 96%   BMI 34.99 kg/m    Subjective:    Patient ID: Edwin Ford, male    DOB: 1981-03-03, 39 y.o.   MRN: [id number]  HPI: Edwin Ford is a 39 y.o. male presenting on 11/05/2020 for No chief complaint on file.   HPI    Pt had a negative covid 19 screening questionnaire.    Pt is 39yoM who presents to re-establish care.  He was last seen at this office 09/03/20.   At that time he already had insurance.   He says he never went to another PCP or got seen by another provider.  Pt says he is no longer employed.  He says he is doing okay.  He says his bp is likely up today because he didn't take his meds yet.   He is still seeing his psychiatrist.  He has no complaints today.  He still has his cough that was discussed at his October appointment.  He is still smoking.      Relevant past medical, surgical, family and social history reviewed and updated as indicated. Interim medical history since our last visit reviewed. Allergies and medications reviewed and updated.   Current Outpatient Medications:  .  albuterol (PROVENTIL HFA) 108 (90 Base) MCG/ACT inhaler, INHALE 2 PUFFS BY MOUTH EVERY 6 HOURS AS NEEDED FOR COUGHING, WHEEZING, OR SHORTNESS OF BREATH, Disp: 20.1 g, Rfl: 0 .  alprazolam (XANAX) 2 MG tablet, Take 2 mg by mouth 3 (three) times daily as needed for sleep., Disp: , Rfl:  .  amLODipine (NORVASC) 10 MG tablet, Take 1 tablet (10 mg total) by mouth daily., Disp: 90 tablet, Rfl: 0 .  atorvastatin (LIPITOR) 20 MG tablet, TAKE 1 Tablet BY MOUTH ONCE DAILY, Disp: 90 tablet, Rfl: 0 .  clonazePAM (KLONOPIN) 1 MG tablet, Take 1 mg by mouth 3 (three) times daily as needed for anxiety., Disp: , Rfl:  .  insulin glargine (LANTUS) 100 UNIT/ML injection, Inject 40 Units into the skin at bedtime. Reported on 04/23/2016, Disp: , Rfl:  .  JANUVIA 100 MG tablet, TAKE 1  Tablet BY MOUTH ONCE EVERY DAY, Disp: 90 tablet, Rfl: 0 .  lisinopril (ZESTRIL) 40 MG tablet, TAKE 1 Tablet BY MOUTH ONCE DAILY, Disp: 90 tablet, Rfl: 0 .  metFORMIN (GLUCOPHAGE) 1000 MG tablet, TAKE 1 Tablet  BY MOUTH TWICE DAILY WITH MEALS, Disp: 180 tablet, Rfl: 0 .  metoprolol tartrate (LOPRESSOR) 100 MG tablet, TAKE 1 Tablet  BY MOUTH TWICE DAILY, Disp: 180 tablet, Rfl: 0 .  omeprazole (PRILOSEC) 20 MG capsule, TAKE 1 Capsule  BY MOUTH TWICE DAILY, Disp: 180 capsule, Rfl: 0 .  sertraline (ZOLOFT) 100 MG tablet, Take 2 tablets (200 mg total) by mouth daily., Disp: 60 tablet, Rfl: 0 .  sertraline (ZOLOFT) 50 MG tablet, Take 50 mg by mouth daily., Disp: , Rfl:  .  ALPRAZolam (XANAX) 1 MG tablet, Take 1 tablet (1 mg total) by mouth 3 (three) times daily as needed for anxiety. (Patient not taking: Reported on 11/05/2020), Disp: 12 tablet, Rfl: 0    Review of Systems  Per HPI unless specifically indicated above     Objective:    BP 140/86   Pulse 74   Temp (!) 96.1 F (35.6 C)   Ht 5' 11.5" (  1.816 m)   Wt 254 lb 6.4 oz (115.4 kg)   SpO2 96%   BMI 34.99 kg/m   Wt Readings from Last 3 Encounters:  11/05/20 254 lb 6.4 oz (115.4 kg)  01/02/20 297 lb (134.7 kg)  11/01/18 296 lb 8 oz (134.5 kg)    Physical Exam Vitals reviewed.  Constitutional:      General: He is not in acute distress.    Appearance: He is well-developed and well-nourished. He is obese. He is not ill-appearing.  HENT:     Head: Normocephalic and atraumatic.  Cardiovascular:     Rate and Rhythm: Normal rate and regular rhythm.  Pulmonary:     Effort: Pulmonary effort is normal.     Breath sounds: Normal breath sounds. No wheezing.  Abdominal:     General: Bowel sounds are normal.     Palpations: Abdomen is soft. There is no hepatosplenomegaly.     Tenderness: There is no abdominal tenderness.  Musculoskeletal:        General: No edema.     Cervical back: Neck supple.     Right lower leg: No edema.      Left lower leg: No edema.  Lymphadenopathy:     Cervical: No cervical adenopathy.  Skin:    General: Skin is warm and dry.  Neurological:     Mental Status: He is alert and oriented to person, place, and time.  Psychiatric:        Mood and Affect: Mood and affect normal.        Behavior: Behavior normal.            Assessment & Plan:    Encounter Diagnoses  Name Primary?  . Type 2 diabetes mellitus with complication, with long-term current use of insulin ([facility]) Yes  . Essential hypertension, benign   . Hyperlipidemia, unspecified hyperlipidemia type   . Cough   . Tobacco use disorder   . Depression, unspecified depression type   . Obesity, unspecified classification, unspecified obesity type, unspecified whether serious comorbidity present       -will Update labs -will Get cxr for chronic cough as discussed at previous appointment -medication refills sent to [facility]. -pt will be called with lab/x-ray resuls -pt to follow up 3 months.  He is to contact office sooner prn

## 2020-11-20 ENCOUNTER — Other Ambulatory Visit: Payer: Self-pay

## 2020-11-20 ENCOUNTER — Emergency Department (HOSPITAL_COMMUNITY): Admission: EM | Admit: 2020-11-20 | Discharge: 2020-11-20 | Discharge status: Against Medical Advice | Payer: Self-pay

## 2020-12-05 ENCOUNTER — Other Ambulatory Visit: Payer: Self-pay | Admitting: Physician Assistant

## 2020-12-05 MED ORDER — AMLODIPINE BESYLATE 10 MG PO TABS: 10.0000 mg | ORAL_TABLET | Freq: Every day | ORAL | 0 refills | Status: DC

## 2021-02-07 ENCOUNTER — Ambulatory Visit: Payer: Self-pay | Admitting: Physician Assistant

## 2021-02-07 ENCOUNTER — Other Ambulatory Visit: Payer: Self-pay

## 2021-02-07 ENCOUNTER — Encounter: Payer: Self-pay | Admitting: Physician Assistant

## 2021-02-07 ENCOUNTER — Other Ambulatory Visit (HOSPITAL_COMMUNITY)
Admission: RE | Admit: 2021-02-07 | Discharge: 2021-02-07 | Disposition: A | Discharge status: Home / Self Care | Payer: Self-pay | Source: Ambulatory Visit | Attending: Physician Assistant | Admitting: Physician Assistant

## 2021-02-07 ENCOUNTER — Ambulatory Visit (HOSPITAL_COMMUNITY)
Admission: RE | Admit: 2021-02-07 | Discharge: 2021-02-07 | Disposition: A | Discharge status: Home / Self Care | Payer: Self-pay | Source: Ambulatory Visit | Attending: Physician Assistant | Admitting: Physician Assistant

## 2021-02-07 VITALS — BP 130/85 | HR 71 | Temp 97.9°F

## 2021-02-07 DIAGNOSIS — E118 Type 2 diabetes mellitus with unspecified complications: Secondary | ICD-10-CM | POA: Insufficient documentation

## 2021-02-07 DIAGNOSIS — R059 Cough, unspecified: Secondary | ICD-10-CM | POA: Insufficient documentation

## 2021-02-07 DIAGNOSIS — E785 Hyperlipidemia, unspecified: Secondary | ICD-10-CM

## 2021-02-07 DIAGNOSIS — Z794 Long term (current) use of insulin: Secondary | ICD-10-CM

## 2021-02-07 DIAGNOSIS — R69 Illness, unspecified: Secondary | ICD-10-CM | POA: Insufficient documentation

## 2021-02-07 DIAGNOSIS — F17200 Nicotine dependence, unspecified, uncomplicated: Secondary | ICD-10-CM | POA: Insufficient documentation

## 2021-02-07 DIAGNOSIS — I10 Essential (primary) hypertension: Secondary | ICD-10-CM

## 2021-02-07 DIAGNOSIS — E669 Obesity, unspecified: Secondary | ICD-10-CM

## 2021-02-07 LAB — LIPID PANEL
Cholesterol: 182 mg/dL (ref 0–200)
HDL: 40 mg/dL — ABNORMAL LOW (ref >40)
LDL Cholesterol: 113 mg/dL — ABNORMAL HIGH (ref 0–99)
Total CHOL/HDL Ratio: 4.6 RATIO
Triglycerides: 143 mg/dL (ref <150)
VLDL: 29 mg/dL (ref 0–40)

## 2021-02-07 LAB — COMPREHENSIVE METABOLIC PANEL
ALT: 25 U/L (ref 0–44)
AST: 23 U/L (ref 15–41)
Albumin: 3.8 g/dL (ref 3.5–5.0)
Alkaline Phosphatase: 81 U/L (ref 38–126)
Anion gap: 10 (ref 5–15)
BUN: 12 mg/dL (ref 6–20)
CO2: 24 mmol/L (ref 22–32)
Calcium: 9.2 mg/dL (ref 8.9–10.3)
Chloride: 100 mmol/L (ref 98–111)
Creatinine, Ser: 0.74 mg/dL (ref 0.61–1.24)
GFR, Estimated: >60 mL/min (ref >60)
Glucose, Bld: 161 mg/dL — ABNORMAL HIGH (ref 70–99)
Potassium: 4.5 mmol/L (ref 3.5–5.1)
Sodium: 134 mmol/L — ABNORMAL LOW (ref 135–145)
Total Bilirubin: 0.4 mg/dL (ref 0.3–1.2)
Total Protein: 6.8 g/dL (ref 6.5–8.1)

## 2021-02-07 LAB — HEMOGLOBIN A1C
Hgb A1c MFr Bld: 6.7 % — ABNORMAL HIGH (ref 4.8–5.6)
Mean Plasma Glucose: 145.59 mg/dL

## 2021-02-07 NOTE — Progress Notes (Signed)
BP 130/85   Pulse 71   Temp 97.9 F (36.6 C)   SpO2 96%    Subjective:    Patient ID: Edwin Ford, male    DOB: [birth year], 40 y.o.   MRN: [id number]  HPI: Edwin Ford is a 40 y.o. male presenting on 02/07/2021 for No chief complaint on file.   HPI   Pt had a negative covid 19 screening questionnaire.   Pt is 40yoM who presents for routine follow up DM, HTN and dyslipidemia.  He got his labs drawn this morning.  He says he is Doing well.  He says he is having No problems.   He just this morning got the CXR that was ordered in December for evaluation of chronic cough.  When asked if he is still having the cough, he said I don't know, maybe  He got J&J covid vaccination but is Not boosted.  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   He thinks he will get insurance in 3 months.      Relevant past medical, surgical, family and social history reviewed and updated as indicated. Interim medical history since our last visit reviewed. Allergies and medications reviewed and updated.     Current Outpatient Medications:  .  albuterol (PROVENTIL HFA) 108 (90 Base) MCG/ACT inhaler, INHALE 2 PUFFS BY MOUTH EVERY 6 HOURS AS NEEDED FOR COUGHING, WHEEZING, OR SHORTNESS OF BREATH, Disp: 20.1 g, Rfl: 0 .  alprazolam (XANAX) 2 MG tablet, Take 2 mg by mouth 3 (three) times daily as needed for sleep., Disp: , Rfl:  .  amLODipine (NORVASC) 10 MG tablet, Take 1 tablet (10 mg total) by mouth daily., Disp: 90 tablet, Rfl: 0 .  atorvastatin (LIPITOR) 20 MG tablet, Take 1 tablet (20 mg total) by mouth daily., Disp: 90 tablet, Rfl: 0 .  clonazePAM (KLONOPIN) 1 MG tablet, Take 1 mg by mouth 3 (three) times daily as needed for anxiety., Disp: , Rfl:  .  insulin glargine (LANTUS) 100 UNIT/ML injection, Inject 40 Units into the skin at bedtime. Reported on 04/23/2016, Disp: , Rfl:  .  lisinopril (ZESTRIL) 40 MG tablet, Take 1 tablet (40 mg total) by mouth daily., Disp:  90 tablet, Rfl: 0 .  metFORMIN (GLUCOPHAGE) 1000 MG tablet, Take 1 tablet (1,000 mg total) by mouth 2 (two) times daily with a meal., Disp: 180 tablet, Rfl: 0 .  metoprolol tartrate (LOPRESSOR) 100 MG tablet, Take 1 tablet (100 mg total) by mouth 2 (two) times daily., Disp: 180 tablet, Rfl: 0 .  omeprazole (PRILOSEC) 20 MG capsule, Take 1 capsule (20 mg total) by mouth 2 (two) times daily., Disp: 180 capsule, Rfl: 0 .  sertraline (ZOLOFT) 100 MG tablet, Take 2 tablets (200 mg total) by mouth daily., Disp: 60 tablet, Rfl: 0 .  sertraline (ZOLOFT) 50 MG tablet, Take 50 mg by mouth daily., Disp: , Rfl:  .  sitaGLIPtin (JANUVIA) 100 MG tablet, TAKE 1 Tablet BY MOUTH ONCE EVERY DAY, Disp: 90 tablet, Rfl: 0    Review of Systems  Per HPI unless specifically indicated above     Objective:    BP 130/85   Pulse 71   Temp 97.9 F (36.6 C)   SpO2 96%   Wt Readings from Last 3 Encounters:  11/05/20 254 lb 6.4 oz (115.4 kg)  01/02/20 297 lb (134.7 kg)  11/01/18 296 lb 8 oz (134.5 kg)    Physical Exam Vitals reviewed.  Constitutional:      General: He is not in acute distress.    Appearance: He is well-developed. He is not ill-appearing.  HENT:     Head: Normocephalic and atraumatic.  Cardiovascular:     Rate and Rhythm: Normal rate and regular rhythm.  Pulmonary:     Effort: Pulmonary effort is normal.     Breath sounds: Normal breath sounds. No wheezing.  Abdominal:     General: Bowel sounds are normal.     Palpations: Abdomen is soft.     Tenderness: There is no abdominal tenderness.  Musculoskeletal:     Cervical back: Neck supple.     Right lower leg: No edema.     Left lower leg: No edema.  Lymphadenopathy:     Cervical: No cervical adenopathy.  Skin:    General: Skin is warm and dry.  Neurological:     Mental Status: He is alert and oriented to person, place, and time.  Psychiatric:        Attention and Perception: Attention normal.        Speech: Speech normal.         Behavior: Behavior normal. Behavior is cooperative.     Results for orders placed or performed during the hospital encounter of 02/07/21  Lipid panel  Result Value Ref Range   Cholesterol 182 0 - 200 mg/dL   Triglycerides 284 <132 mg/dL   HDL 40 (L) >44 mg/dL   Total CHOL/HDL Ratio 4.6 RATIO   VLDL 29 0 - 40 mg/dL   LDL Cholesterol 010 (H) 0 - 99 mg/dL  Comprehensive metabolic panel  Result Value Ref Range   Sodium 134 (L) 135 - 145 mmol/L   Potassium 4.5 3.5 - 5.1 mmol/L   Chloride 100 98 - 111 mmol/L   CO2 24 22 - 32 mmol/L   Glucose, Bld 161 (H) 70 - 99 mg/dL   BUN 12 6 - 20 mg/dL   Creatinine, Ser 2.72 0.61 - 1.24 mg/dL   Calcium 9.2 8.9 - 53.6 mg/dL   Total Protein 6.8 6.5 - 8.1 g/dL   Albumin 3.8 3.5 - 5.0 g/dL   AST 23 15 - 41 U/L   ALT 25 0 - 44 U/L   Alkaline Phosphatase 81 38 - 126 U/L   Total Bilirubin 0.4 0.3 - 1.2 mg/dL   GFR, Estimated >64 >40 mL/min   Anion gap 10 5 - 15      Assessment & Plan:    Encounter Diagnoses  Name Primary?  . Type 2 diabetes mellitus with complication, with long-term current use of insulin (HCC) Yes  . Essential hypertension, benign   . Hyperlipidemia, unspecified hyperlipidemia type   . Tobacco use disorder   . Obesity, unspecified classification, unspecified obesity type, unspecified whether serious comorbidity present       -Reviewed cmp and lipid panel with pt.  a1c test and cxr are still pending. -Discussed increasing atorvastatin but he says he will try to watch diet better.  His living circumstances have changed and thus likely what he eats. -he will be called with results a1c and cxr -no medication changes.  He is given insulin 4 vials of lantus -encouraged pt to get covid booster and encouraged smoking cesstaion -pt to continue with his MH provider -he will be scheduled to follow up in 3 months.  He is asked to notify office if he gets insurance prior to that time.

## 2021-02-08 LAB — MICROALBUMIN, URINE: Microalb, Ur: 54.6 ug/mL — ABNORMAL HIGH

## 2021-02-15 ENCOUNTER — Other Ambulatory Visit: Payer: Self-pay | Admitting: Physician Assistant

## 2021-04-25 ENCOUNTER — Other Ambulatory Visit: Payer: Self-pay | Admitting: Physician Assistant

## 2021-04-25 DIAGNOSIS — E118 Type 2 diabetes mellitus with unspecified complications: Secondary | ICD-10-CM

## 2021-04-25 DIAGNOSIS — I10 Essential (primary) hypertension: Secondary | ICD-10-CM

## 2021-04-25 DIAGNOSIS — E785 Hyperlipidemia, unspecified: Secondary | ICD-10-CM

## 2021-04-25 MED ORDER — INSULIN GLARGINE 100 UNIT/ML ~~LOC~~ SOLN: 40.0000 [IU] | Freq: Every day | SUBCUTANEOUS | 1 refills | Status: DC

## 2021-05-08 ENCOUNTER — Ambulatory Visit: Payer: Self-pay | Admitting: Physician Assistant

## 2021-05-12 ENCOUNTER — Other Ambulatory Visit: Payer: Self-pay | Admitting: Physician Assistant

## 2021-05-13 ENCOUNTER — Other Ambulatory Visit: Payer: Self-pay | Admitting: Physician Assistant

## 2021-05-23 ENCOUNTER — Other Ambulatory Visit: Payer: Self-pay | Admitting: Physician Assistant

## 2021-05-23 MED ORDER — OMEPRAZOLE 20 MG PO CPDR: 20.0000 mg | DELAYED_RELEASE_CAPSULE | Freq: Two times a day (BID) | ORAL | 0 refills | Status: DC

## 2021-05-23 MED ORDER — SITAGLIPTIN PHOSPHATE 100 MG PO TABS: ORAL_TABLET | ORAL | 0 refills | Status: DC

## 2021-05-23 MED ORDER — ATORVASTATIN CALCIUM 20 MG PO TABS: ORAL_TABLET | ORAL | 0 refills | Status: DC

## 2021-05-23 MED ORDER — METOPROLOL TARTRATE 100 MG PO TABS: 100.0000 mg | ORAL_TABLET | Freq: Two times a day (BID) | ORAL | 0 refills | Status: DC

## 2021-05-23 MED ORDER — LISINOPRIL 40 MG PO TABS: ORAL_TABLET | ORAL | 0 refills | Status: DC

## 2021-05-23 MED ORDER — METFORMIN HCL 1000 MG PO TABS: 1000.0000 mg | ORAL_TABLET | Freq: Two times a day (BID) | ORAL | 0 refills | Status: DC

## 2021-05-23 MED ORDER — ALBUTEROL SULFATE HFA 108 (90 BASE) MCG/ACT IN AERS: INHALATION_SPRAY | RESPIRATORY_TRACT | 0 refills | Status: AC

## 2021-05-23 MED ORDER — AMLODIPINE BESYLATE 10 MG PO TABS: ORAL_TABLET | ORAL | 0 refills | Status: DC

## 2021-06-06 ENCOUNTER — Ambulatory Visit: Payer: Self-pay | Admitting: Physician Assistant

## 2022-04-11 ENCOUNTER — Ambulatory Visit: Payer: 59 | Admitting: Student

## 2022-04-11 ENCOUNTER — Encounter: Payer: Self-pay | Admitting: Student

## 2022-04-11 VITALS — BP 146/89 | HR 80 | Ht 72.0 in | Wt 251.1 lb

## 2022-04-11 DIAGNOSIS — Z7689 Persons encountering health services in other specified circumstances: Secondary | ICD-10-CM | POA: Diagnosis not present

## 2022-04-11 DIAGNOSIS — E118 Type 2 diabetes mellitus with unspecified complications: Secondary | ICD-10-CM

## 2022-04-11 LAB — POCT GLYCOSYLATED HEMOGLOBIN (HGB A1C): HbA1c, POC (controlled diabetic range): 11.7 % — AB (ref 0.0–7.0)

## 2022-04-11 MED ORDER — OMEPRAZOLE 20 MG PO CPDR: 20.0000 mg | DELAYED_RELEASE_CAPSULE | Freq: Two times a day (BID) | ORAL | 1 refills | Status: DC

## 2022-04-11 MED ORDER — INSULIN GLARGINE 100 UNIT/ML ~~LOC~~ SOLN: 25.0000 [IU] | Freq: Every day | SUBCUTANEOUS | 1 refills | Status: DC

## 2022-04-11 MED ORDER — METOPROLOL TARTRATE 100 MG PO TABS: 100.0000 mg | ORAL_TABLET | Freq: Two times a day (BID) | ORAL | 1 refills | Status: DC

## 2022-04-11 MED ORDER — AMLODIPINE BESYLATE 10 MG PO TABS: ORAL_TABLET | ORAL | 1 refills | Status: DC

## 2022-04-11 MED ORDER — METFORMIN HCL ER 500 MG PO TB24: 500.0000 mg | ORAL_TABLET | Freq: Two times a day (BID) | ORAL | 1 refills | Status: DC

## 2022-04-11 MED ORDER — ATORVASTATIN CALCIUM 20 MG PO TABS: ORAL_TABLET | ORAL | 1 refills | Status: DC

## 2022-04-11 MED ORDER — LISINOPRIL 40 MG PO TABS: ORAL_TABLET | ORAL | 1 refills | Status: DC

## 2022-04-11 NOTE — Progress Notes (Unsigned)
  SUBJECTIVE:   CHIEF COMPLAINT / HPI:   Complaints today: Vision - bothering him for 2-3 months, some days better than others, looks like a cloud is sitting on eyes, happens with far away/close up. Does where glasses, and they don't help. Saw eye doctor in October, and got new lenses. Describes vision as blurry, straining to see. No eye pain. Early morning eyes are ok, worse during the day. Some double vision/blurriness.  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 Interested in testing in future.   PERTINENT  PMH / PSH: HTN, HLD, DM   OBJECTIVE:  There were no vitals taken for this visit.  General: NAD, pleasant, able to participate in exam Cardiac: RRR, no murmurs auscultated. Respiratory: CTAB, normal effort, no wheezes, rales or rhonchi Abdomen: soft, non-tender, non-distended, normoactive bowel sounds Extremities: warm and well perfused, no edema or cyanosis. Skin: warm and dry, no rashes noted Neuro: alert, no obvious focal deficits, speech normal Psych: Normal affect and mood  ASSESSMENT/PLAN:  No  problem-specific Assessment & Plan notes found for this encounter.   No orders of the defined types were placed in this encounter.  No orders of the defined types were placed in this encounter.  No follow-ups on file. @SIGNNOTE @ {    This will disappear when note is signed, click to select method of visit    :1}

## 2022-04-11 NOTE — Patient Instructions (Addendum)
It was great to see you! Thank you for allowing me to participate in your care!  I recommend that you always bring your medications to each appointment as this makes it easy to ensure we are on the correct medications and helps [place] not miss when refills are needed.  Our plans for today:  - Vision Problems - likely caused by your elevated blood sugar - [place] - Use over the counter hydrocortisone cream as needed - Initial Visit  Meds refilled - Follow up in 1 week for health maintenance  Discuss diabetes and other screenings  Check and record your blood sugars  Take care and seek immediate care sooner if you develop any concerns.   Dr. Bess Kinds, MD [facility]

## 2022-04-14 ENCOUNTER — Encounter: Payer: Self-pay | Admitting: Student

## 2022-04-14 MED ORDER — ALCOHOL SWABSTICK 70 % PADS: MEDICATED_PAD | 2 refills | Status: DC

## 2022-04-14 MED ORDER — BLOOD GLUCOSE MONITOR KIT: PACK | 2 refills | Status: DC

## 2022-04-18 ENCOUNTER — Ambulatory Visit: Payer: 59 | Admitting: Student

## 2022-04-18 ENCOUNTER — Encounter: Payer: Self-pay | Admitting: Student

## 2022-04-18 VITALS — BP 128/78 | HR 71 | Ht 72.0 in | Wt 257.4 lb

## 2022-04-18 DIAGNOSIS — E118 Type 2 diabetes mellitus with unspecified complications: Secondary | ICD-10-CM

## 2022-04-18 DIAGNOSIS — E785 Hyperlipidemia, unspecified: Secondary | ICD-10-CM

## 2022-04-18 DIAGNOSIS — Z114 Encounter for screening for human immunodeficiency virus [HIV]: Secondary | ICD-10-CM | POA: Diagnosis not present

## 2022-04-18 DIAGNOSIS — Z1159 Encounter for screening for other viral diseases: Secondary | ICD-10-CM

## 2022-04-18 DIAGNOSIS — I10 Essential (primary) hypertension: Secondary | ICD-10-CM

## 2022-04-18 MED ORDER — INSULIN GLARGINE 100 UNIT/ML ~~LOC~~ SOLN
30.0000 [IU] | Freq: Every day | SUBCUTANEOUS | 1 refills | Status: DC
Start: 2022-04-18 — End: 2022-06-13

## 2022-04-18 MED ORDER — METFORMIN HCL ER 500 MG PO TB24: ORAL_TABLET | ORAL | 1 refills | Status: DC

## 2022-04-18 NOTE — Assessment & Plan Note (Signed)
Lipid panel today -Continue Lipitor 20

## 2022-04-18 NOTE — Addendum Note (Signed)
Addended by: Bess Kinds T on: 04/18/2022 11:47 AM   Modules accepted: Orders

## 2022-04-18 NOTE — Assessment & Plan Note (Addendum)
BP in range today 127/78. Will continue antihypertensive regimen -Amlodipine 10 -Lisinopril 40 -Metoprolol tartrate 100

## 2022-04-18 NOTE — Progress Notes (Signed)
  SUBJECTIVE:   CHIEF COMPLAINT / HPI:   F/u DM and health maintenance  DM: Regimen  Lantus 25 units, previously 40 units daily  Januvia 100 mg: Not taking  Metformin 500 mg BID Compliance: Daily CBG ranges: 150-350, fasting CBG 240-260's Symptoms: no symptoms of Hypoglycemia, does feel thirsty frequently and is peeing frequently.  A1c: 11.7  - 04/11/22  HTN: BP:150/92 Meds: Amlodipine 10, lisinopril 40, metoprolol tartate 100 Symptoms: No CP or SOB Compliance: Daily   HLD Lab Results  Component Value Date   CHOL 182 02/07/2021   HDL 40 (L) 02/07/2021   LDLCALC 113 (H) 02/07/2021   TRIG 143 02/07/2021   CHOLHDL 4.6 02/07/2021  Meds: Lipitor 20 Compliance daily   Screening: Hep C HIV    PERTINENT  PMH / PSH: DM, HTN, HLD, MDD, Anxiety   OBJECTIVE:  BP 128/78   Pulse 71   Ht 6' (1.829 m)   Wt 257 lb 6.4 oz (116.8 kg)   SpO2 99%   BMI 34.91 kg/m   General: NAD, pleasant, able to participate in exam Cardiac: RRR, no murmurs auscultated. Respiratory: CTAB, normal effort, no wheezes, rales or rhonchi Abdomen: soft, non-tender, non-distended, normoactive bowel sounds Extremities: warm and well perfused, no edema or cyanosis. Skin: warm and dry, no rashes noted Neuro: alert, no obvious focal deficits, speech normal Psych: Normal affect and mood  ASSESSMENT/PLAN:  Essential hypertension, benign BP in range today 127/78. Will continue antihypertensive regimen -Amlodipine 10 -Lisinopril 40 -Metoprolol tartrate 100  Type 2 diabetes mellitus with complication (HCC) Patient continues to complain of issues with vision and increased urination, both likely stemming from elevated CBGs. CBG range 150-350, with fasting CBG's ranging from 240-260. Will adjust lantus and metformin. -Lantus 30 units daily -Metformin  -1000 mg am, 500 mg pm, for 1 week  -1000 mg am, 1000 mg pm  -Continue CBG monitoring   Hyperlipidemia Lipid panel today -Continue Lipitor  20  Health Maintenance HIV testing Hep C testing   Orders Placed This Encounter  Procedures   Basic Metabolic Panel   Lipid Panel   Hepatitis C antibody   HIV antibody (with reflex)

## 2022-04-18 NOTE — Assessment & Plan Note (Signed)
Patient continues to complain of issues with vision and increased urination, both likely stemming from elevated CBGs. CBG range 150-350, with fasting CBG's ranging from 240-260. Will adjust lantus and metformin. -Lantus 30 units daily -Metformin  -1000 mg am, 500 mg pm, for 1 week  -1000 mg am, 1000 mg pm  -Continue CBG monitoring

## 2022-04-18 NOTE — Patient Instructions (Addendum)
It was great to see you! Thank you for allowing me to participate in your care!  I recommend that you always bring your medications to each appointment as this makes it easy to ensure we are on the correct medications and helps [place] not miss when refills are needed.  Our plans for today:  - Adjust Diabetes Medicine  Increase Metformin   Starting Monday 04/21/22:  1000 mg in am, 500 mg in pm  Starting Monday 04/28/22:  1000 mg in am, 1000 mg in pm   Increase Insulin   Take 30 units daily  - Cholesterol Check - Electrolyte Check - HIV screen - Hep C screen  We are checking some labs today, I will call you if they are abnormal will send you a MyChart message or a letter if they are normal.  If you do not hear about your labs in the next 2 weeks please let us know.  Take care and seek immediate care sooner if you develop any concerns.   Dr. Bess Kinds, MD [facility]

## 2022-04-19 LAB — BASIC METABOLIC PANEL
BUN/Creatinine Ratio: 15 (ref 9–20)
BUN: 11 mg/dL (ref 6–24)
CO2: 21 mmol/L (ref 20–29)
Calcium: 9.6 mg/dL (ref 8.7–10.2)
Chloride: 101 mmol/L (ref 96–106)
Creatinine, Ser: 0.71 mg/dL — ABNORMAL LOW (ref 0.76–1.27)
Glucose: 284 mg/dL — ABNORMAL HIGH (ref 70–99)
Potassium: 4.5 mmol/L (ref 3.5–5.2)
Sodium: 137 mmol/L (ref 134–144)
eGFR: 117 mL/min/{1.73_m2} (ref >59)

## 2022-04-19 LAB — LIPID PANEL
Chol/HDL Ratio: 5.3 ratio — ABNORMAL HIGH (ref 0.0–5.0)
Cholesterol, Total: 169 mg/dL (ref 100–199)
HDL: 32 mg/dL — ABNORMAL LOW (ref >39)
LDL Chol Calc (NIH): 93 mg/dL (ref 0–99)
Triglycerides: 260 mg/dL — ABNORMAL HIGH (ref 0–149)
VLDL Cholesterol Cal: 44 mg/dL — ABNORMAL HIGH (ref 5–40)

## 2022-04-19 LAB — HEPATITIS C ANTIBODY: Hep C Virus Ab: NONREACTIVE

## 2022-04-19 LAB — HIV ANTIBODY (ROUTINE TESTING W REFLEX): HIV Screen 4th Generation wRfx: NONREACTIVE

## 2022-04-21 ENCOUNTER — Encounter: Payer: Self-pay | Admitting: Student

## 2022-06-13 ENCOUNTER — Other Ambulatory Visit: Payer: Self-pay | Admitting: Student

## 2022-06-13 ENCOUNTER — Encounter: Payer: Self-pay | Admitting: Student

## 2022-06-13 ENCOUNTER — Ambulatory Visit (INDEPENDENT_AMBULATORY_CARE_PROVIDER_SITE_OTHER): Payer: 59 | Admitting: Student

## 2022-06-13 VITALS — BP 155/94 | HR 77 | Ht 72.0 in | Wt 251.6 lb

## 2022-06-13 DIAGNOSIS — E118 Type 2 diabetes mellitus with unspecified complications: Secondary | ICD-10-CM

## 2022-06-13 DIAGNOSIS — Z23 Encounter for immunization: Secondary | ICD-10-CM

## 2022-06-13 DIAGNOSIS — I10 Essential (primary) hypertension: Secondary | ICD-10-CM

## 2022-06-13 MED ORDER — METFORMIN HCL ER 500 MG PO TB24
ORAL_TABLET | ORAL | 1 refills | Status: DC
Start: 2022-06-13 — End: 2022-09-20

## 2022-06-13 MED ORDER — METOPROLOL TARTRATE 100 MG PO TABS: 100.0000 mg | ORAL_TABLET | Freq: Two times a day (BID) | ORAL | 1 refills | Status: DC

## 2022-06-13 MED ORDER — INSULIN GLARGINE 100 UNIT/ML ~~LOC~~ SOLN
30.0000 [IU] | Freq: Every day | SUBCUTANEOUS | 1 refills | Status: DC
Start: 2022-06-13 — End: 2022-06-14

## 2022-06-13 MED ORDER — LISINOPRIL 40 MG PO TABS
ORAL_TABLET | ORAL | 1 refills | Status: DC
Start: 2022-06-13 — End: 2023-05-30

## 2022-06-13 MED ORDER — ATORVASTATIN CALCIUM 20 MG PO TABS
ORAL_TABLET | ORAL | 1 refills | Status: DC
Start: 2022-06-13 — End: 2023-05-30

## 2022-06-13 MED ORDER — AMLODIPINE BESYLATE 10 MG PO TABS
ORAL_TABLET | ORAL | 1 refills | Status: DC
Start: 2022-06-13 — End: 2023-05-30

## 2022-06-13 NOTE — Assessment & Plan Note (Addendum)
Patient BP elevated today, 164/100, but patient reports that he hasn't been taking his lisinopril, for issues with filling at the pharmacy. Will refill meds at new pharmacy. Patient denies any chest pain or SOB. -Continue lisinopril 40 mg, Amlodipine 10, Metoprolol 100 BID

## 2022-06-13 NOTE — Assessment & Plan Note (Addendum)
Patient reports frequent loose with dose of metformin. Will decrease dose to 500 mg in am and 1000 mg in the pm. Also gave instructions to have patient decrease dose to 500 mg BID if issues persisted. Patient reports that he hasn't been taking his insulin, and has been frustrated with getting his meds from the pharmacy. Patient having no issues with blurry vision, or increased thirst or increased frequency of urination. Encouraged patient to restart meds and follow up in 1 month. Refilled all meds at new pharmacy. Foot exam was normal.  - Metformin 500 mg am, 1000 mg pm - Lantus 30 units daily - Track CBG's - F/u in 1 mo

## 2022-06-13 NOTE — Progress Notes (Signed)
SUBJECTIVE:   CHIEF COMPLAINT / HPI:   F/u DM Meds: lantus 30 units daily, metformin 1000 mg BID Compliance: Not taking insulin for last month.  Symptoms: Having GI side effects from the metformin, having loose stools. No symptoms of thirst or blurry vision, not peeing to frequently. Fasting CBG: Not tracking CBG range: Not tracking Foot Exam: Normal Vaccines: T-dap and Prevnar 20 - patient does want Had issue with meds being filled at the wrong time, having to go back to the pharmacy multiple times. Patient frustrated and hasn't been taking his medicine. Encouraged to continue health and medicine. Changing pharamcy to [facility] on [address].     PERTINENT  PMH / PSH: DM, HTN   OBJECTIVE:  BP (!) 155/94   Pulse 77   Ht 6' (1.829 m)   Wt 114.1 kg   SpO2 99%   BMI 34.12 kg/m   General: NAD, pleasant, able to participate in exam Cardiac: RRR, no murmurs auscultated. Respiratory: CTAB, normal effort, no wheezes, rales or rhonchi Abdomen: soft, non-tender, non-distended Extremities: warm and well perfused, no edema or cyanosis. Skin: warm and dry, no rashes noted Neuro: alert, no obvious focal deficits, speech normal Psych: Normal affect and mood  Physical Exam Musculoskeletal:     Right foot: Normal. No swelling.     Left foot: Normal. No swelling.      ASSESSMENT/PLAN:  Essential hypertension, benign Patient BP elevated today, 164/100, but patient reports that he hasn't been taking his lisinopril, for issues with filling at the pharmacy. Will refill meds at new pharmacy. Patient denies any chest pain or SOB. -Continue lisinopril 40 mg, Amlodipine 10, Metoprolol 100 BID  Type 2 diabetes mellitus with complication (HCC) Patient reports frequent loose with dose of metformin. Will decrease dose to 500 mg in am and 1000 mg in the pm. Also gave instructions to have patient decrease dose to 500 mg BID if issues persisted. Patient reports that he hasn't been taking his insulin,  and has been frustrated with getting his meds from the pharmacy. Patient having no issues with blurry vision, or increased thirst or increased frequency of urination. Encouraged patient to restart meds and follow up in 1 month. Refilled all meds at new pharmacy. Foot exam was normal.  - Metformin 500 mg am, 1000 mg pm - Lantus 30 units daily - Track CBG's - F/u in 1 mo  Pharmacy Issues: Patient had issues with getting meds filled from pharmacy and was very frustrated/stopped taking them. Changed pharmacy and encouraged patient to reach out to clinic if having issues with med refills.    Orders Placed This Encounter  Procedures   Pneumococcal conjugate vaccine 20-valent (Prevnar 20)   Tdap vaccine greater than or equal to 7yo IM   Meds ordered this encounter  Medications   amLODipine (NORVASC) 10 MG tablet    Sig: TAKE 1 Tablet BY MOUTH ONCE EVERY DAY    Dispense:  90 tablet    Refill:  1   atorvastatin (LIPITOR) 20 MG tablet    Sig: TAKE 1 Tablet BY MOUTH ONCE EVERY DAY    Dispense:  90 tablet    Refill:  1   insulin glargine (LANTUS) 100 UNIT/ML injection    Sig: Inject 0.3 mLs (30 Units total) into the skin at bedtime. Reported on 04/23/2016    Dispense:  10 mL    Refill:  1   metFORMIN (GLUCOPHAGE-XR) 500 MG 24 hr tablet    Sig: Starting 6/5 - 1000 mg am  and 500 mg pm Starting 6/12 - 1000 mg am and 1000 mg pm    Dispense:  90 tablet    Refill:  1   metoprolol tartrate (LOPRESSOR) 100 MG tablet    Sig: Take 1 tablet (100 mg total) by mouth 2 (two) times daily.    Dispense:  180 tablet    Refill:  1   lisinopril (ZESTRIL) 40 MG tablet    Sig: TAKE 1 Tablet BY MOUTH ONCE EVERY DAY    Dispense:  90 tablet    Refill:  1   No follow-ups on file. @SIGNNOTE @

## 2022-06-13 NOTE — Patient Instructions (Signed)
It was great to see you! Thank you for allowing me to participate in your care!  I recommend that you always bring your medications to each appointment as this makes it easy to ensure we are on the correct medications and helps [place] not miss when refills are needed.  Our plans for today:  - Metformin: 500 mg in the morning, 1000 mg in the evening. IF GI symptoms continue after 1 day, decrease dose to 500 mg in the am and 500 mg in the pm. - Insulin:  Restart 30 units daily - Pharmacy issues: If having issues with the pharmacy, call clinic to see if there is anything we can assist with. - Follow up in 1 month.    Take care and seek immediate care sooner if you develop any concerns.   Dr. Bess Kinds, MD [facility]

## 2022-07-10 ENCOUNTER — Other Ambulatory Visit: Payer: Self-pay | Admitting: Student

## 2022-07-10 MED ORDER — LANTUS SOLOSTAR 100 UNIT/ML ~~LOC~~ SOPN
30.0000 [IU] | PEN_INJECTOR | Freq: Every day | SUBCUTANEOUS | 0 refills | Status: DC
Start: 2022-07-10 — End: 2022-08-15

## 2022-07-10 NOTE — Addendum Note (Signed)
Addended by: Bess Kinds T on: 07/10/2022 05:22 PM   Modules accepted: Orders

## 2022-07-10 NOTE — Addendum Note (Signed)
Addended by: Latrelle Dodrill on: 07/10/2022 05:20 PM   Modules accepted: Orders

## 2022-07-15 ENCOUNTER — Ambulatory Visit (HOSPITAL_COMMUNITY)
Admission: EM | Admit: 2022-07-15 | Discharge: 2022-07-15 | Disposition: A | Discharge status: Home / Self Care | Payer: 59 | Attending: Internal Medicine | Admitting: Internal Medicine

## 2022-07-15 ENCOUNTER — Encounter (HOSPITAL_COMMUNITY): Payer: Self-pay | Admitting: *Deleted

## 2022-07-15 DIAGNOSIS — I10 Essential (primary) hypertension: Secondary | ICD-10-CM

## 2022-07-15 DIAGNOSIS — Z79899 Other long term (current) drug therapy: Secondary | ICD-10-CM

## 2022-07-15 MED ORDER — BLOOD PRESSURE CUFF MISC: 0 refills | Status: DC

## 2022-07-15 NOTE — Discharge Instructions (Signed)
Start taking your blood pressure medications.  They are all at the pharmacy for you. Take your blood pressure with the provided blood pressure cuff at the pharmacy twice daily for the next 3 to 4 days while you are starting these medications again.  Schedule an appointment for follow-up with your primary care provider for reevaluation in the next 2 to 3 weeks to follow-up on your blood pressure for medication adjustment if needed.  Take blood pressure 3-4 times weekly and write these numbers down.  Take your list of blood pressures to your primary care doctor so that they are able to make decisions about your medications based on these blood pressures that you have at home.  Return to urgent care if you experience any severe headache, dizziness, lightheadedness, nausea, vomiting, or any new or worsening symptoms.  If your symptoms are severe, please go to the nearest emergency department. I hope you feel better!

## 2022-07-15 NOTE — ED Provider Notes (Signed)
[facility]    CSN: [id number] Arrival date & time: 07/15/22  1220      History   Chief Complaint Chief Complaint  Patient presents with   Hypertension    HPI Edwin Ford is a 40 y.o. male.   Patient presents to urgent care for evaluation of elevated blood pressure after being off of his blood pressure medications for the last approximately 5 weeks.  Patient was seen by his primary care provider on June 13, 2022, instructed to restart his amlodipine 10 mg, lisinopril 40 mg, and metoprolol 100 mg.  Patient did not start these medications and has not had any antihypertensive medications in his body for the last 5 weeks.  Blood pressure is stable today at 151/89.  Blood pressure at last PCP visit was 155/94.  Patient was scheduled to see his PCP soon for follow-up on blood pressure management but states that he had to cancel this appointment since he has not been taking his blood pressure medications as advised.  Reports difficulty affording BP/diabetes meds but states that he recently got insurance back and is now able to pay for them.  Denies headache, dizziness, shortness of breath, chest pain, nausea, vomiting, abdominal pain, urinary symptoms, low back pain, neck pain, vision changes, and URI symptoms.  No fever/chills.  Patient states that he has been attempting to lose weight over the last few months and has successfully lost approximately 30 pounds with trying so that he is able to come off of some of his blood pressure medications and diabetes medications. Requesting that medications are sent to 1 single pharmacy instead of 2 separate pharmacies so that it is easier for him to pick up medications all at one time as he takes multiple different medications for hypertension and diabetes as well.  Patient does not own a blood pressure cuff at home and does not check his blood pressure frequently.   Hypertension    Past Medical History:  Diagnosis Date   Allergies     Anxiety    Asthma    Depression    Essential hypertension    GERD (gastroesophageal reflux disease)    Hyperlipidemia    Type 2 diabetes mellitus ([place])     Patient Active Problem List   Diagnosis Date Noted   Depression 01/22/2016   Type 2 diabetes mellitus with complication ([place]) 01/60/1093   Essential hypertension, benign 09/19/2015   Hyperlipidemia 09/19/2015   Cigarette nicotine dependence, uncomplicated 23/55/7322   Morbid obesity ([place]) 09/19/2015    Past Surgical History:  Procedure Laterality Date   TOOTH EXTRACTION N/A        Home Medications    Prior to Admission medications   Medication Sig Start Date End Date Taking? Authorizing Provider  albuterol (PROVENTIL HFA) 108 (90 Base) MCG/ACT inhaler INHALE 2 PUFFS BY MOUTH EVERY 6 HOURS AS NEEDED FOR COUGHING, WHEEZING, OR SHORTNESS OF BREATH 05/23/21  Yes Soyla Dryer, PA-C  Alcohol Swabstick 70 % PADS Clean finger with swab before testing blood sugar 04/14/22  Yes Sowell, Erlene Quan, MD  Blood Pressure Monitoring (BLOOD PRESSURE CUFF) MISC Take BP 3-4 times weekly. 07/15/22  Yes Talbot Grumbling, FNP  alprazolam Duanne Moron) 2 MG tablet Take 2 mg by mouth 3 (three) times daily as needed for sleep.    [provider]  amLODipine (NORVASC) 10 MG tablet TAKE 1 Tablet BY MOUTH ONCE EVERY DAY 06/13/22   Holley Bouche, MD  atorvastatin (LIPITOR) 20 MG tablet TAKE 1 Tablet BY MOUTH  ONCE EVERY DAY 06/13/22   Holley Bouche, MD  blood glucose meter kit and supplies KIT Dispense based on patient and insurance preference. Use up to four times daily as directed. 04/14/22   Holley Bouche, MD  clonazePAM (KLONOPIN) 1 MG tablet Take 1 mg by mouth 3 (three) times daily as needed for anxiety.    [provider]  insulin glargine (LANTUS SOLOSTAR) 100 UNIT/ML Solostar Pen Inject 30 Units into the skin daily. 07/10/22   Holley Bouche, MD  lisinopril (ZESTRIL) 40 MG tablet TAKE 1 Tablet BY MOUTH ONCE EVERY DAY 06/13/22    Holley Bouche, MD  metFORMIN (GLUCOPHAGE-XR) 500 MG 24 hr tablet Starting 6/5 - 1000 mg am and 500 mg pm Starting 6/12 - 1000 mg am and 1000 mg pm 06/13/22   Holley Bouche, MD  metoprolol tartrate (LOPRESSOR) 100 MG tablet Take 1 tablet (100 mg total) by mouth 2 (two) times daily. 06/13/22   Holley Bouche, MD  omeprazole (PRILOSEC) 20 MG capsule Take 1 capsule (20 mg total) by mouth 2 (two) times daily. 04/11/22   Holley Bouche, MD  sertraline (ZOLOFT) 100 MG tablet Take 2 tablets (200 mg total) by mouth daily. 03/12/17   Soyla Dryer, PA-C  sertraline (ZOLOFT) 50 MG tablet Take 50 mg by mouth daily.    [provider]  sitaGLIPtin (JANUVIA) 100 MG tablet TAKE 1 Tablet BY MOUTH ONCE EVERY DAY 05/23/21   Soyla Dryer, PA-C    Family History Family History  Problem Relation Age of Onset   CAD Mother        CABG at age 81   Hypertension Mother    Diabetes Mellitus II Mother    High Cholesterol Mother    Kidney disease Father    Diabetes Mellitus II Father    Depression Father    High Cholesterol Father    Hypertension Father     Social History Social History   Tobacco Use   Smoking status: Every Day    Packs/day: 1.00    Years: 25.00    Total pack years: 25.00    Types: Cigarettes    Start date: 11/17/1994   Smokeless tobacco: Former    Types: Chew  Substance Use Topics   Alcohol use: No   Drug use: Yes    Frequency: 7.0 times per week    Types: Marijuana    Comment: daily use     Allergies   Bee pollen and Pollen extract   Review of Systems Review of Systems Per HPI  Physical Exam Triage Vital Signs ED Triage Vitals  Enc Vitals Group     BP 07/15/22 1256 (!) 151/89     Pulse Rate 07/15/22 1256 87     Resp 07/15/22 1256 20     Temp 07/15/22 1256 98.7 F (37.1 C)     Temp Source 07/15/22 1256 Oral     SpO2 07/15/22 1256 97 %     Weight --      Height --      Head Circumference --      Peak Flow --      Pain Score 07/15/22 1253 0      Pain Loc --      Pain Edu? --      Excl. in [place]? --    No data found.  Updated Vital Signs BP (!) 151/89 (BP Location: Right Arm)   Pulse 87   Temp 98.7 F (37.1 C) (Oral)   Resp 20  SpO2 97%   Visual Acuity Right Eye Distance:   Left Eye Distance:   Bilateral Distance:    Right Eye Near:   Left Eye Near:    Bilateral Near:     Physical Exam Vitals and nursing note reviewed.  Constitutional:      Appearance: Normal appearance. He is not ill-appearing or toxic-appearing.     Comments: Very pleasant patient sitting on exam in position of comfort table in no acute distress.   HENT:     Head: Normocephalic and atraumatic.     Right Ear: Hearing and external ear normal.     Left Ear: Hearing and external ear normal.     Nose: Nose normal.     Mouth/Throat:     Lips: Pink.     Mouth: Mucous membranes are moist.  Eyes:     General: Lids are normal. Vision grossly intact. Gaze aligned appropriately.     Extraocular Movements: Extraocular movements intact.     Conjunctiva/sclera: Conjunctivae normal.  Cardiovascular:     Rate and Rhythm: Normal rate and regular rhythm.     Heart sounds: Normal heart sounds, S1 normal and S2 normal.  Pulmonary:     Effort: Pulmonary effort is normal. No respiratory distress.     Breath sounds: Normal breath sounds and air entry.  Abdominal:     Palpations: Abdomen is soft.  Musculoskeletal:     Cervical back: Neck supple.  Skin:    General: Skin is warm and dry.     Capillary Refill: Capillary refill takes less than 2 seconds.     Findings: No rash.  Neurological:     General: No focal deficit present.     Mental Status: He is alert and oriented to person, place, and time. Mental status is at baseline.     Cranial Nerves: No dysarthria or facial asymmetry.     Gait: Gait is intact.  Psychiatric:        Mood and Affect: Mood normal.        Speech: Speech normal.        Behavior: Behavior normal.        Thought Content: Thought  content normal.        Judgment: Judgment normal.      UC Treatments / Results  Labs (all labs ordered are listed, but only abnormal results are displayed) Labs Reviewed - No data to display  EKG   Radiology No results found.  Procedures Procedures (including critical care time)  Medications Ordered in UC Medications - No data to display  Initial Impression / Assessment and Plan / UC Course  I have reviewed the triage vital signs and the nursing notes.  Pertinent labs & imaging results that were available during my care of the patient were reviewed by me and considered in my medical decision making (see chart for details).   1.  Essential hypertension and medication management Advised patient to pick up medications from pharmacy.  He is to begin taking his amlodipine 10 mg, lisinopril 40 mg, and metoprolol 100 mg.  He is also to begin taking his metformin.  BP cuff sent to pharmacy.  Advised patient to take blood pressure twice daily for the next 3 to 4 days while starting blood pressure medications again and return to clinic if he develops a significant headache, dizziness, chest pain, shortness of breath, or blurry vision.  He is agreeable with this plan.  Advised patient to schedule a follow-up appointment with PCP  in the next 2 to 3 weeks for reevaluation of blood pressure and medication management.   Discussed physical exam and available lab work findings in clinic with patient.  Counseled patient regarding appropriate use of medications and potential side effects for all medications recommended or prescribed today. Discussed red flag signs and symptoms of worsening condition,when to call the PCP office, return to urgent care, and when to seek higher level of care in the emergency department. Patient verbalizes understanding and agreement with plan. All questions answered. Patient discharged in stable condition.  Final Clinical Impressions(s) / UC Diagnoses   Final diagnoses:   Essential hypertension, benign  Medication management     Discharge Instructions      Start taking your blood pressure medications.  They are all at the pharmacy for you. Take your blood pressure with the provided blood pressure cuff at the pharmacy twice daily for the next 3 to 4 days while you are starting these medications again.  Schedule an appointment for follow-up with your primary care provider for reevaluation in the next 2 to 3 weeks to follow-up on your blood pressure for medication adjustment if needed.  Take blood pressure 3-4 times weekly and write these numbers down.  Take your list of blood pressures to your primary care doctor so that they are able to make decisions about your medications based on these blood pressures that you have at home.  Return to urgent care if you experience any severe headache, dizziness, lightheadedness, nausea, vomiting, or any new or worsening symptoms.  If your symptoms are severe, please go to the nearest emergency department. I hope you feel better!    ED Prescriptions     Medication Sig Dispense Auth. Provider   Blood Pressure Monitoring (BLOOD PRESSURE CUFF) MISC Take BP 3-4 times weekly. 1 each Talbot Grumbling, FNP      PDMP not reviewed this encounter.   Talbot Grumbling, [place] 07/15/22 1354

## 2022-07-15 NOTE — ED Triage Notes (Signed)
Pt states that he is out of his BP meds for about a month. He has been feeling off and clamy. He has been checking his BP at work and it was 201/129 pulse 113.   He states that he is out of all meds and that some are at the pharmacy and he doesn't have money to pick them up and he doesn't want to run back and forth to the pharmacy he wants to make one trip once a month.

## 2022-07-18 ENCOUNTER — Ambulatory Visit: Payer: 59 | Admitting: Student

## 2022-07-18 NOTE — Progress Notes (Deleted)
  SUBJECTIVE:   CHIEF COMPLAINT / HPI:   BM and DM F/u Patient with issues affording meds and was seen at urgent care for refill on 8/29. Patient reports that he has insurance now and can afford them.   HTN Meds: Metoprolol 100, amlodipine 10, lisinopril 40  Compliance: BP BP Readings from Last 3 Encounters:  07/15/22 (!) 151/89  06/13/22 (!) 155/94  04/18/22 128/78   DM Fasting CBG: Hypoglycemia, Polyuria, polydipsia: Meds: Metformin dose 500 mg am, 1000 mg pm, lantus 30 units  Recommend seeing eye doc in October ***   PERTINENT  PMH / PSH: ***  Past Medical History:  Diagnosis Date   Allergies    Anxiety    Asthma    Depression    Essential hypertension    GERD (gastroesophageal reflux disease)    Hyperlipidemia    Type 2 diabetes mellitus ([facility])     Past Surgical History:  Procedure Laterality Date   TOOTH EXTRACTION N/A     OBJECTIVE:  There were no vitals taken for this visit.  General: NAD, pleasant, able to participate in exam Cardiac: RRR, no murmurs auscultated. Respiratory: CTAB, normal effort, no wheezes, rales or rhonchi Abdomen: soft, non-tender, non-distended, normoactive bowel sounds Extremities: warm and well perfused, no edema or cyanosis. Skin: warm and dry, no rashes noted Neuro: alert, no obvious focal deficits, speech normal Psych: Normal affect and mood  ASSESSMENT/PLAN:  No problem-specific Assessment & Plan notes found for this encounter.   No orders of the defined types were placed in this encounter.  No orders of the defined types were placed in this encounter.  No follow-ups on file. @SIGNNOTE @ {    This will disappear when note is signed, click to select method of visit    :1}

## 2022-08-02 IMAGING — DX DG CHEST 2V
2 series · 2 of 2 positions shown · non-contrast
Comparison: November 25, 2015

CLINICAL DATA: Cough and wheezing

EXAM:
CHEST - 2 VIEW

[chest pa]
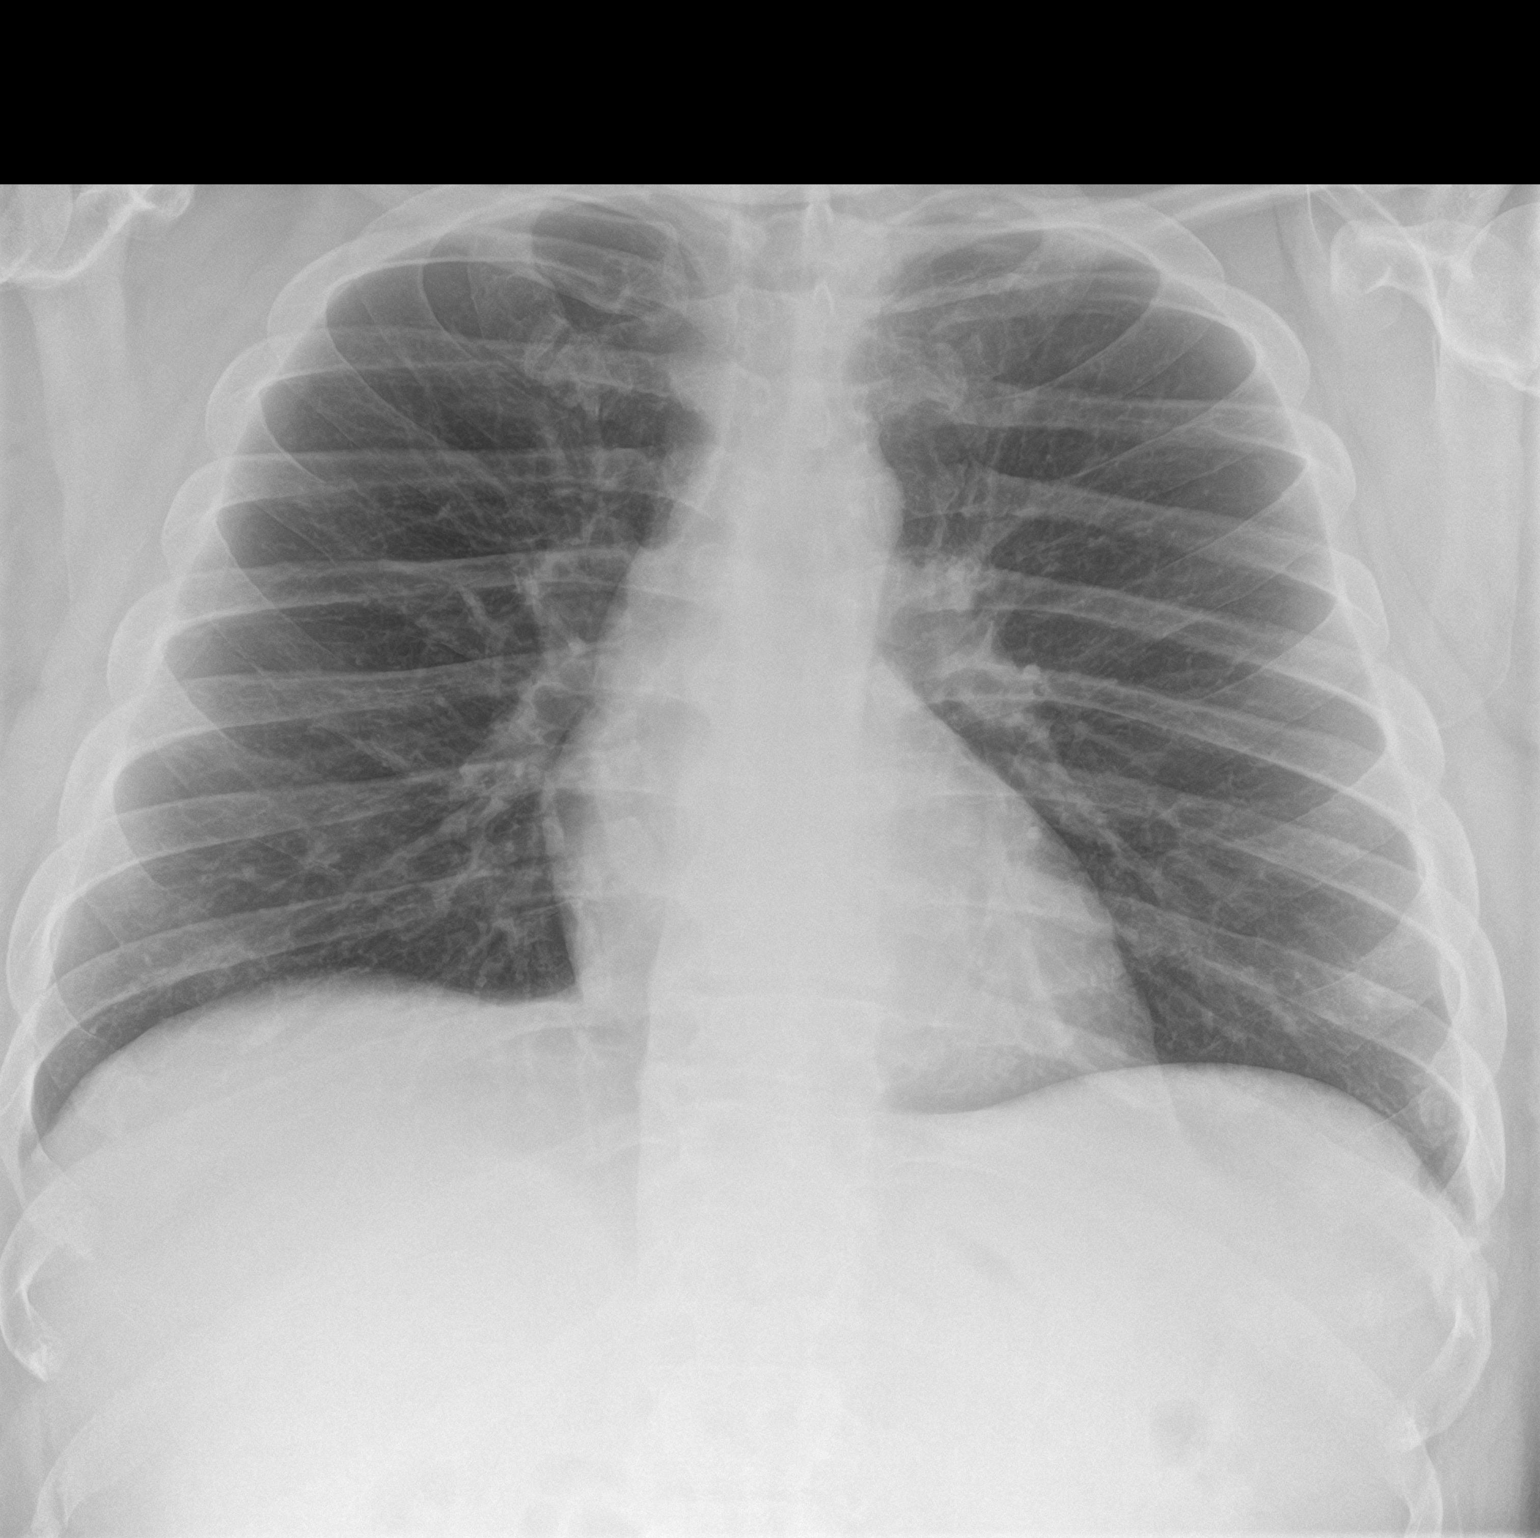

[chest lat]
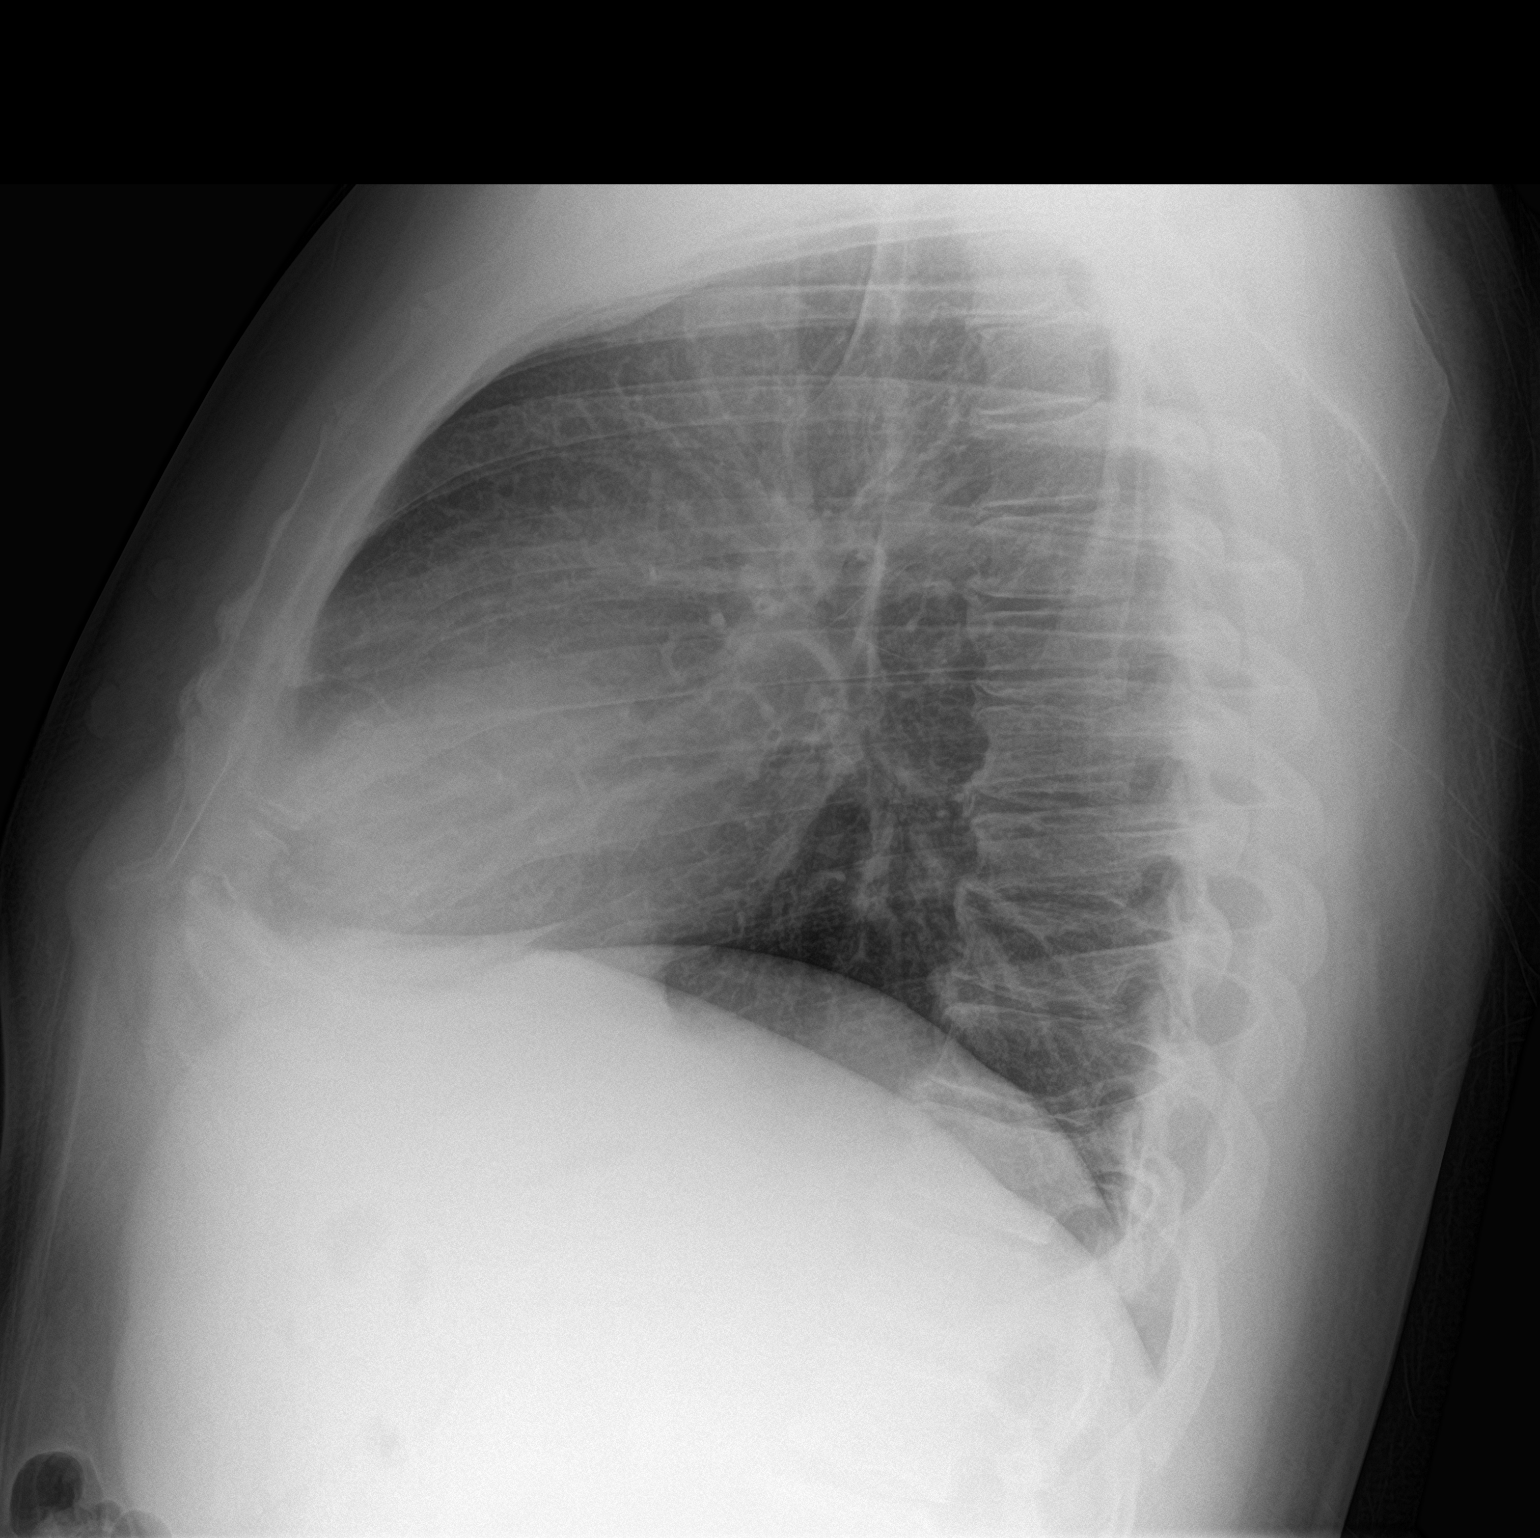

[2 of 2 positions shown; findings below may reference images not displayed]

FINDINGS: Lungs are clear. Heart size and pulmonary vascularity are normal. No
adenopathy. There is degenerative change in the thoracic spine.
IMPRESSION: Lungs clear.  Heart size normal.

## 2022-08-15 ENCOUNTER — Other Ambulatory Visit: Payer: Self-pay

## 2022-08-19 MED ORDER — LANTUS SOLOSTAR 100 UNIT/ML ~~LOC~~ SOPN
30.0000 [IU] | PEN_INJECTOR | Freq: Every day | SUBCUTANEOUS | 0 refills | Status: DC
Start: 2022-08-19 — End: 2023-10-12

## 2022-09-19 ENCOUNTER — Other Ambulatory Visit: Payer: Self-pay | Admitting: Student

## 2022-12-27 ENCOUNTER — Other Ambulatory Visit: Payer: Self-pay | Admitting: Student

## 2023-01-11 ENCOUNTER — Other Ambulatory Visit: Payer: Self-pay | Admitting: Student

## 2023-02-01 ENCOUNTER — Other Ambulatory Visit: Payer: Self-pay | Admitting: Student

## 2023-03-12 ENCOUNTER — Telehealth: Payer: Self-pay | Admitting: Student

## 2023-03-12 NOTE — Telephone Encounter (Signed)
Left a form in your box that the patient needs completed. Thank you! 

## 2023-03-12 NOTE — Telephone Encounter (Signed)
patient dropped off form at front desk for [employer].  Verified that patient section of form has been completed.  Last DOS/WCC with PCP was 06/13/22.  Placed form in red team folder to be completed by clinical staff.  Vilinda Blanks

## 2023-03-25 NOTE — Telephone Encounter (Signed)
Called to ask patient about paperwork he'd left to be filled out. Paperwork saying that patient is to participate in physical capacity testing. I'm not sure what this testing entails or why they are doing it. I cannot clear patient w/o understanding what they will be having patient do/task perform for work, and what patient will undergo w/ physical capacity testing.  Please have patient give more information about what his work will entail, and what the physical capacity testing entails/why they are testing.  Patient also due for follow up appointment and should schedule one to see how diabetes and HTN are progressing.

## 2023-05-18 ENCOUNTER — Other Ambulatory Visit: Payer: Self-pay | Admitting: Student

## 2023-05-20 ENCOUNTER — Other Ambulatory Visit: Payer: Self-pay | Admitting: Student

## 2023-05-30 ENCOUNTER — Encounter (HOSPITAL_BASED_OUTPATIENT_CLINIC_OR_DEPARTMENT_OTHER): Payer: Self-pay

## 2023-05-30 ENCOUNTER — Other Ambulatory Visit: Payer: Self-pay

## 2023-05-30 ENCOUNTER — Emergency Department (HOSPITAL_BASED_OUTPATIENT_CLINIC_OR_DEPARTMENT_OTHER): Payer: PRIVATE HEALTH INSURANCE

## 2023-05-30 ENCOUNTER — Emergency Department (HOSPITAL_BASED_OUTPATIENT_CLINIC_OR_DEPARTMENT_OTHER)
Admission: EM | Admit: 2023-05-30 | Discharge: 2023-05-30 | Disposition: A | Discharge status: Home / Self Care | Payer: PRIVATE HEALTH INSURANCE | Attending: Emergency Medicine | Admitting: Emergency Medicine

## 2023-05-30 DIAGNOSIS — R072 Precordial pain: Secondary | ICD-10-CM | POA: Diagnosis present

## 2023-05-30 DIAGNOSIS — I10 Essential (primary) hypertension: Secondary | ICD-10-CM | POA: Insufficient documentation

## 2023-05-30 DIAGNOSIS — F17200 Nicotine dependence, unspecified, uncomplicated: Secondary | ICD-10-CM | POA: Diagnosis not present

## 2023-05-30 DIAGNOSIS — R1031 Right lower quadrant pain: Secondary | ICD-10-CM | POA: Insufficient documentation

## 2023-05-30 DIAGNOSIS — R109 Unspecified abdominal pain: Secondary | ICD-10-CM

## 2023-05-30 DIAGNOSIS — E119 Type 2 diabetes mellitus without complications: Secondary | ICD-10-CM | POA: Insufficient documentation

## 2023-05-30 DIAGNOSIS — J45909 Unspecified asthma, uncomplicated: Secondary | ICD-10-CM | POA: Diagnosis not present

## 2023-05-30 LAB — CBC WITH DIFFERENTIAL/PLATELET
Abs Immature Granulocytes: 0.03 10*3/uL (ref 0.00–0.07)
Basophils Absolute: 0.1 10*3/uL (ref 0.0–0.1)
Basophils Relative: 1 %
Eosinophils Absolute: 0.1 10*3/uL (ref 0.0–0.5)
Eosinophils Relative: 2 %
HCT: 35.8 % — ABNORMAL LOW (ref 39.0–52.0)
Hemoglobin: 13.3 g/dL (ref 13.0–17.0)
Immature Granulocytes: 1 %
Lymphocytes Relative: 28 %
Lymphs Abs: 1.7 10*3/uL (ref 0.7–4.0)
MCH: 30.9 pg (ref 26.0–34.0)
MCHC: 37.2 g/dL — ABNORMAL HIGH (ref 30.0–36.0)
MCV: 83.3 fL (ref 80.0–100.0)
Monocytes Absolute: 0.5 10*3/uL (ref 0.1–1.0)
Monocytes Relative: 9 %
Neutro Abs: 3.6 10*3/uL (ref 1.7–7.7)
Neutrophils Relative %: 59 %
Platelets: 167 10*3/uL (ref 150–400)
RBC: 4.3 MIL/uL (ref 4.22–5.81)
RDW: 12.3 % (ref 11.5–15.5)
WBC: 6 10*3/uL (ref 4.0–10.5)
nRBC: 0 % (ref 0.0–0.2)

## 2023-05-30 LAB — COMPREHENSIVE METABOLIC PANEL
ALT: 14 U/L (ref 0–44)
AST: 15 U/L (ref 15–41)
Albumin: 3.4 g/dL — ABNORMAL LOW (ref 3.5–5.0)
Alkaline Phosphatase: 76 U/L (ref 38–126)
Anion gap: 8 (ref 5–15)
BUN: 10 mg/dL (ref 6–20)
CO2: 24 mmol/L (ref 22–32)
Calcium: 8.6 mg/dL — ABNORMAL LOW (ref 8.9–10.3)
Chloride: 104 mmol/L (ref 98–111)
Creatinine, Ser: 0.88 mg/dL (ref 0.61–1.24)
GFR, Estimated: >60 mL/min (ref >60)
Glucose, Bld: 320 mg/dL — ABNORMAL HIGH (ref 70–99)
Potassium: 3.6 mmol/L (ref 3.5–5.1)
Sodium: 136 mmol/L (ref 135–145)
Total Bilirubin: 0.5 mg/dL (ref 0.3–1.2)
Total Protein: 6.4 g/dL — ABNORMAL LOW (ref 6.5–8.1)

## 2023-05-30 LAB — URINALYSIS, ROUTINE W REFLEX MICROSCOPIC
Bilirubin Urine: NEGATIVE
Glucose, UA: >=500 mg/dL — AB
Ketones, ur: NEGATIVE mg/dL
Leukocytes,Ua: NEGATIVE
Nitrite: NEGATIVE
Protein, ur: >=300 mg/dL — AB
Specific Gravity, Urine: >=1.03 (ref 1.005–1.030)
pH: 6 (ref 5.0–8.0)

## 2023-05-30 LAB — URINALYSIS, MICROSCOPIC (REFLEX)

## 2023-05-30 LAB — TROPONIN I (HIGH SENSITIVITY)
Troponin I (High Sensitivity): 5 ng/L (ref <18)
Troponin I (High Sensitivity): 7 ng/L (ref <18)

## 2023-05-30 LAB — LIPASE, BLOOD: Lipase: 57 U/L — ABNORMAL HIGH (ref 11–51)

## 2023-05-30 LAB — CK: Total CK: 61 U/L (ref 49–397)

## 2023-05-30 MED ORDER — ATORVASTATIN CALCIUM 20 MG PO TABS: ORAL_TABLET | ORAL | 0 refills | Status: DC

## 2023-05-30 MED ORDER — SODIUM CHLORIDE 0.9 % IV BOLUS
1000.0000 mL | Freq: Once | INTRAVENOUS | Status: AC
  Administered 2023-05-30: 1000 mL via INTRAVENOUS

## 2023-05-30 MED ORDER — METOPROLOL TARTRATE 100 MG PO TABS: 100.0000 mg | ORAL_TABLET | Freq: Two times a day (BID) | ORAL | 3 refills | Status: DC

## 2023-05-30 MED ORDER — AMLODIPINE BESYLATE 10 MG PO TABS: ORAL_TABLET | ORAL | 0 refills | Status: DC

## 2023-05-30 MED ORDER — LISINOPRIL 40 MG PO TABS: ORAL_TABLET | ORAL | 0 refills | Status: DC

## 2023-05-30 MED ORDER — IOHEXOL 300 MG/ML  SOLN
100.0000 mL | Freq: Once | INTRAMUSCULAR | Status: AC | PRN
  Administered 2023-05-30: 100 mL via INTRAVENOUS

## 2023-05-30 NOTE — Discharge Instructions (Signed)

## 2023-05-30 NOTE — ED Triage Notes (Signed)
Patient has had chest pain for one month. He stated it comes and goes. Patient has also had shortness of breath.

## 2023-05-30 NOTE — ED Provider Notes (Signed)
Emergency Department Provider Note   I have reviewed the triage vital signs and the nursing notes.   HISTORY  Chief Complaint Chest Pain   HPI Edwin Ford is a 42 y.o. male past history of asthma, hypertension, diabetes, hyperlipidemia presents emergency department for evaluation of chest pain and right flank discomfort.  Symptoms have been ongoing for approximately the past 4 weeks.  In terms of the patient's chest discomfort, he feels that as a pressure in the right of his chest.  No clear provoking factors.  Occasionally will have shortness of breath and a sharper component.  Symptoms are intermittent.   He also notes some right flank discomfort.  Unsure if he has pulled a muscle as he does work outside and a fairly labor-intensive job.  He stays hydrated.  He has noticed some darkening of his urine and mild dysuria.  No prior history of stone.  Similarly, pain has been present for the past several weeks.   Is currently off of all of his blood pressure medications and his Zoloft.  He plans to restart these medicines with PCP appointment upcoming.   Past Medical History:  Diagnosis Date   Allergies    Anxiety    Asthma    Depression    Essential hypertension    GERD (gastroesophageal reflux disease)    Hyperlipidemia    Type 2 diabetes mellitus ([facility])     Review of Systems  Constitutional: No fever/chills Cardiovascular: Positive chest pain. Respiratory: Denies shortness of breath. Gastrointestinal: Positive right flank/abdominal pain.  No nausea, no vomiting.  No diarrhea.  No constipation. Genitourinary: Negative for dysuria. Musculoskeletal: Negative for back pain. Skin: Negative for rash. Neurological: Negative for headaches, focal weakness or numbness.   ____________________________________________   PHYSICAL EXAM:  VITAL SIGNS: ED Triage Vitals  Encounter Vitals Group     BP 05/30/23 1742 (!) 203/106     Pulse Rate 05/30/23 1742 91     Resp 05/30/23  1742 20     Temp 05/30/23 1742 98.8 F (37.1 C)     Temp Source 05/30/23 1742 Oral     SpO2 05/30/23 1742 100 %     Weight 05/30/23 1740 250 lb (113.4 kg)     Height 05/30/23 1740 6' (1.829 m)   Constitutional: Alert and oriented. Well appearing and in no acute distress. Eyes: Conjunctivae are normal.  Head: Atraumatic. Nose: No congestion/rhinnorhea. Mouth/Throat: Mucous membranes are moist.  Neck: No stridor.  Cardiovascular: Normal rate, regular rhythm. Good peripheral circulation. Grossly normal heart sounds.   Respiratory: Normal respiratory effort.  No retractions. Lungs CTAB. Gastrointestinal: Soft with point tenderness in the right lower quadrant.  No peritonitis.  No CVA tenderness. No distention.  Musculoskeletal: No gross deformities of extremities. Neurologic:  Normal speech and language.  Skin:  Skin is warm, dry and intact. No rash noted.  ____________________________________________   LABS (all labs ordered are listed, but only abnormal results are displayed)  Labs Reviewed  COMPREHENSIVE METABOLIC PANEL - Abnormal; Notable for the following components:      Result Value   Glucose, Bld 320 (*)    Calcium 8.6 (*)    Total Protein 6.4 (*)    Albumin 3.4 (*)    All other components within normal limits  LIPASE, BLOOD - Abnormal; Notable for the following components:   Lipase 57 (*)    All other components within normal limits  CBC WITH DIFFERENTIAL/PLATELET - Abnormal; Notable for the following components:   HCT  35.8 (*)    MCHC 37.2 (*)    All other components within normal limits  URINALYSIS, ROUTINE W REFLEX MICROSCOPIC - Abnormal; Notable for the following components:   Glucose, UA >=500 (*)    Hgb urine dipstick MODERATE (*)    Protein, ur >=300 (*)    All other components within normal limits  URINALYSIS, MICROSCOPIC (REFLEX) - Abnormal; Notable for the following components:   Bacteria, UA RARE (*)    All other components within normal limits  CK   TROPONIN I (HIGH SENSITIVITY)  TROPONIN I (HIGH SENSITIVITY)   ____________________________________________  EKG   EKG Interpretation Date/Time:  Saturday May 30 2023 17:46:13 EDT Ventricular Rate:  91 PR Interval:  120 QRS Duration:  144 QT Interval:  385 QTC Calculation: 474 R Axis:   -69  Text Interpretation: Sinus rhythm Right bundle branch block Change from 2017 tracing Confirmed by Alona Bene [phone]) on 05/30/2023 5:52:33 PM       ____________________________________________   PROCEDURES  Procedure(s) performed:   Procedures  None ____________________________________________   INITIAL IMPRESSION / ASSESSMENT AND PLAN / ED COURSE  Pertinent labs & imaging results that were available during my care of the patient were reviewed by me and considered in my medical decision making (see chart for details).   This patient is Presenting for Evaluation of CP, which does require a range of treatment options, and is a complaint that involves a high risk of morbidity and mortality.  The Differential Diagnoses includes but is not exclusive to acute coronary syndrome, aortic dissection, pulmonary embolism, cardiac tamponade, community-acquired pneumonia, pericarditis, musculoskeletal chest wall pain, etc.   Critical Interventions-    Medications  sodium chloride 0.9 % bolus 1,000 mL (0 mLs Intravenous Stopped 05/30/23 2028)  iohexol (OMNIPAQUE) 300 MG/ML solution 100 mL (100 mLs Intravenous Contrast Given 05/30/23 2014)    Reassessment after intervention: symptoms improved.    Clinical Laboratory Tests Ordered, included troponin negative x 2. No AKI.   Radiologic Tests Ordered, included CXR and CT abdomen/pelvis. I independently interpreted the images and agree with radiology interpretation.   Cardiac Monitor Tracing which shows NSR.    Social Determinants of Health Risk patient is a smoker.    Medical Decision Making: Summary:  Patient presents emergency  department with intermittent chest and right flank pain.  Difficult to make a unifying diagnosis here.  He does have some focal tenderness on abdominal exam in the right lower quadrant.  Plan for CT abdomen pelvis along with labs looking for evidence of ACS.  Reevaluation with update and discussion with patient. Labs are reassuring. Contact information given for PCP. Patient to call for an appointment.    Patient's presentation is most consistent with acute presentation with potential threat to life or bodily function.   Disposition: discharge  ____________________________________________  FINAL CLINICAL IMPRESSION(S) / ED DIAGNOSES  Final diagnoses:  Precordial chest pain  Right flank pain  Uncontrolled hypertension    Note:  This document was prepared using Dragon voice recognition software and may include unintentional dictation errors.  Alona Bene, MD, [facility]    Tatelyn Vanhecke, Arlyss Repress, MD 06/03/23 [phone]

## 2023-06-02 ENCOUNTER — Telehealth: Payer: Self-pay | Admitting: Family Medicine

## 2023-06-02 NOTE — Telephone Encounter (Signed)
Error

## 2023-10-03 ENCOUNTER — Other Ambulatory Visit: Payer: Self-pay

## 2023-10-03 ENCOUNTER — Emergency Department (HOSPITAL_BASED_OUTPATIENT_CLINIC_OR_DEPARTMENT_OTHER): Payer: Self-pay

## 2023-10-03 ENCOUNTER — Encounter (HOSPITAL_BASED_OUTPATIENT_CLINIC_OR_DEPARTMENT_OTHER): Payer: Self-pay

## 2023-10-03 ENCOUNTER — Encounter (HOSPITAL_COMMUNITY)
Admission: EM | Disposition: A | Discharge status: Home / Self Care | Payer: Self-pay | Source: Home / Self Care | Attending: Thoracic Surgery (Cardiothoracic Vascular Surgery)

## 2023-10-03 ENCOUNTER — Inpatient Hospital Stay (HOSPITAL_BASED_OUTPATIENT_CLINIC_OR_DEPARTMENT_OTHER)
Admission: EM | Admit: 2023-10-03 | Discharge: 2023-10-12 | DRG: 234 | Disposition: A | Discharge status: Home / Self Care | Payer: 59 | Attending: Cardiology | Admitting: Cardiology

## 2023-10-03 ENCOUNTER — Emergency Department (HOSPITAL_COMMUNITY): Payer: 59

## 2023-10-03 DIAGNOSIS — R072 Precordial pain: Secondary | ICD-10-CM | POA: Diagnosis present

## 2023-10-03 DIAGNOSIS — Z91148 Patient's other noncompliance with medication regimen for other reason: Secondary | ICD-10-CM

## 2023-10-03 DIAGNOSIS — R079 Chest pain, unspecified: Secondary | ICD-10-CM

## 2023-10-03 DIAGNOSIS — Z9119 Patient's noncompliance with other medical treatment and regimen due to financial hardship: Secondary | ICD-10-CM | POA: Diagnosis not present

## 2023-10-03 DIAGNOSIS — E119 Type 2 diabetes mellitus without complications: Secondary | ICD-10-CM | POA: Diagnosis not present

## 2023-10-03 DIAGNOSIS — I214 Non-ST elevation (NSTEMI) myocardial infarction: Principal | ICD-10-CM | POA: Diagnosis present

## 2023-10-03 DIAGNOSIS — F1210 Cannabis abuse, uncomplicated: Secondary | ICD-10-CM | POA: Diagnosis present

## 2023-10-03 DIAGNOSIS — Z8249 Family history of ischemic heart disease and other diseases of the circulatory system: Secondary | ICD-10-CM

## 2023-10-03 DIAGNOSIS — I4510 Unspecified right bundle-branch block: Secondary | ICD-10-CM | POA: Diagnosis present

## 2023-10-03 DIAGNOSIS — E785 Hyperlipidemia, unspecified: Secondary | ICD-10-CM | POA: Diagnosis present

## 2023-10-03 DIAGNOSIS — Z79899 Other long term (current) drug therapy: Secondary | ICD-10-CM | POA: Diagnosis not present

## 2023-10-03 DIAGNOSIS — Z6832 Body mass index (BMI) 32.0-32.9, adult: Secondary | ICD-10-CM

## 2023-10-03 DIAGNOSIS — Z951 Presence of aortocoronary bypass graft: Secondary | ICD-10-CM | POA: Diagnosis not present

## 2023-10-03 DIAGNOSIS — R0689 Other abnormalities of breathing: Secondary | ICD-10-CM | POA: Diagnosis not present

## 2023-10-03 DIAGNOSIS — R000 Tachycardia, unspecified: Secondary | ICD-10-CM | POA: Diagnosis not present

## 2023-10-03 DIAGNOSIS — E1159 Type 2 diabetes mellitus with other circulatory complications: Secondary | ICD-10-CM | POA: Diagnosis present

## 2023-10-03 DIAGNOSIS — Z720 Tobacco use: Secondary | ICD-10-CM | POA: Diagnosis not present

## 2023-10-03 DIAGNOSIS — D696 Thrombocytopenia, unspecified: Secondary | ICD-10-CM | POA: Diagnosis not present

## 2023-10-03 DIAGNOSIS — Z5901 Sheltered homelessness: Secondary | ICD-10-CM | POA: Diagnosis not present

## 2023-10-03 DIAGNOSIS — Z833 Family history of diabetes mellitus: Secondary | ICD-10-CM

## 2023-10-03 DIAGNOSIS — E669 Obesity, unspecified: Secondary | ICD-10-CM | POA: Diagnosis present

## 2023-10-03 DIAGNOSIS — Z1152 Encounter for screening for COVID-19: Secondary | ICD-10-CM

## 2023-10-03 DIAGNOSIS — R739 Hyperglycemia, unspecified: Secondary | ICD-10-CM

## 2023-10-03 DIAGNOSIS — F17210 Nicotine dependence, cigarettes, uncomplicated: Secondary | ICD-10-CM | POA: Diagnosis present

## 2023-10-03 DIAGNOSIS — Z91030 Bee allergy status: Secondary | ICD-10-CM

## 2023-10-03 DIAGNOSIS — K219 Gastro-esophageal reflux disease without esophagitis: Secondary | ICD-10-CM | POA: Diagnosis present

## 2023-10-03 DIAGNOSIS — I2510 Atherosclerotic heart disease of native coronary artery without angina pectoris: Secondary | ICD-10-CM

## 2023-10-03 DIAGNOSIS — Z794 Long term (current) use of insulin: Secondary | ICD-10-CM

## 2023-10-03 DIAGNOSIS — I25110 Atherosclerotic heart disease of native coronary artery with unstable angina pectoris: Secondary | ICD-10-CM

## 2023-10-03 DIAGNOSIS — Z5986 Financial insecurity: Secondary | ICD-10-CM

## 2023-10-03 DIAGNOSIS — R509 Fever, unspecified: Secondary | ICD-10-CM | POA: Diagnosis not present

## 2023-10-03 DIAGNOSIS — J45909 Unspecified asthma, uncomplicated: Secondary | ICD-10-CM | POA: Diagnosis present

## 2023-10-03 DIAGNOSIS — Z83438 Family history of other disorder of lipoprotein metabolism and other lipidemia: Secondary | ICD-10-CM

## 2023-10-03 DIAGNOSIS — I10 Essential (primary) hypertension: Secondary | ICD-10-CM | POA: Diagnosis present

## 2023-10-03 DIAGNOSIS — E1165 Type 2 diabetes mellitus with hyperglycemia: Secondary | ICD-10-CM | POA: Diagnosis present

## 2023-10-03 DIAGNOSIS — I159 Secondary hypertension, unspecified: Secondary | ICD-10-CM

## 2023-10-03 DIAGNOSIS — Z01810 Encounter for preprocedural cardiovascular examination: Secondary | ICD-10-CM | POA: Diagnosis not present

## 2023-10-03 DIAGNOSIS — Z7984 Long term (current) use of oral hypoglycemic drugs: Secondary | ICD-10-CM

## 2023-10-03 DIAGNOSIS — D62 Acute posthemorrhagic anemia: Secondary | ICD-10-CM | POA: Diagnosis not present

## 2023-10-03 DIAGNOSIS — I152 Hypertension secondary to endocrine disorders: Secondary | ICD-10-CM

## 2023-10-03 DIAGNOSIS — F32A Depression, unspecified: Secondary | ICD-10-CM | POA: Diagnosis present

## 2023-10-03 DIAGNOSIS — Z818 Family history of other mental and behavioral disorders: Secondary | ICD-10-CM | POA: Diagnosis not present

## 2023-10-03 DIAGNOSIS — F419 Anxiety disorder, unspecified: Secondary | ICD-10-CM | POA: Diagnosis present

## 2023-10-03 HISTORY — PX: LEFT HEART CATH AND CORONARY ANGIOGRAPHY: CATH118249

## 2023-10-03 LAB — GLUCOSE, CAPILLARY
Glucose-Capillary: 142 mg/dL — ABNORMAL HIGH (ref 70–99)
Glucose-Capillary: 110 mg/dL — ABNORMAL HIGH (ref 70–99)

## 2023-10-03 LAB — COMPREHENSIVE METABOLIC PANEL
ALT: 18 U/L (ref 0–44)
AST: 21 U/L (ref 15–41)
Albumin: 3.9 g/dL (ref 3.5–5.0)
Alkaline Phosphatase: 76 U/L (ref 38–126)
Anion gap: 10 (ref 5–15)
BUN: 9 mg/dL (ref 6–20)
CO2: 25 mmol/L (ref 22–32)
Calcium: 9.4 mg/dL (ref 8.9–10.3)
Chloride: 100 mmol/L (ref 98–111)
Creatinine, Ser: 0.92 mg/dL (ref 0.61–1.24)
GFR, Estimated: >60 mL/min (ref >60)
Glucose, Bld: 231 mg/dL — ABNORMAL HIGH (ref 70–99)
Potassium: 3.8 mmol/L (ref 3.5–5.1)
Sodium: 135 mmol/L (ref 135–145)
Total Bilirubin: 0.6 mg/dL (ref <1.2)
Total Protein: 7.5 g/dL (ref 6.5–8.1)

## 2023-10-03 LAB — CBC WITH DIFFERENTIAL/PLATELET
Abs Immature Granulocytes: 0.06 10*3/uL (ref 0.00–0.07)
Basophils Absolute: 0.1 10*3/uL (ref 0.0–0.1)
Basophils Relative: 1 %
Eosinophils Absolute: 0.2 10*3/uL (ref 0.0–0.5)
Eosinophils Relative: 2 %
HCT: 44.7 % (ref 39.0–52.0)
Hemoglobin: 16.3 g/dL (ref 13.0–17.0)
Immature Granulocytes: 1 %
Lymphocytes Relative: 29 %
Lymphs Abs: 2.1 10*3/uL (ref 0.7–4.0)
MCH: 30.8 pg (ref 26.0–34.0)
MCHC: 36.5 g/dL — ABNORMAL HIGH (ref 30.0–36.0)
MCV: 84.3 fL (ref 80.0–100.0)
Monocytes Absolute: 0.7 10*3/uL (ref 0.1–1.0)
Monocytes Relative: 10 %
Neutro Abs: 4.2 10*3/uL (ref 1.7–7.7)
Neutrophils Relative %: 57 %
Platelets: 188 10*3/uL (ref 150–400)
RBC: 5.3 MIL/uL (ref 4.22–5.81)
RDW: 12.4 % (ref 11.5–15.5)
WBC: 7.3 10*3/uL (ref 4.0–10.5)
nRBC: 0 % (ref 0.0–0.2)

## 2023-10-03 LAB — TROPONIN I (HIGH SENSITIVITY)
Troponin I (High Sensitivity): 491 ng/L (ref <18)
Troponin I (High Sensitivity): 290 ng/L (ref <18)

## 2023-10-03 LAB — ECHOCARDIOGRAM COMPLETE
Area-P 1/2: 3.6 cm2
Height: 73 in
S' Lateral: 2.9 cm
Weight: 4000 [oz_av]

## 2023-10-03 LAB — HEMOGLOBIN A1C
Hgb A1c MFr Bld: 10.8 % — ABNORMAL HIGH (ref 4.8–5.6)
Mean Plasma Glucose: 263.26 mg/dL

## 2023-10-03 LAB — CBG MONITORING, ED: Glucose-Capillary: 175 mg/dL — ABNORMAL HIGH (ref 70–99)

## 2023-10-03 SURGERY — LEFT HEART CATH AND CORONARY ANGIOGRAPHY
Anesthesia: LOCAL

## 2023-10-03 MED ORDER — VERAPAMIL HCL 2.5 MG/ML IV SOLN
INTRAVENOUS | Status: DC | PRN
  Administered 2023-10-03: 10 mL via INTRA_ARTERIAL

## 2023-10-03 MED ORDER — SODIUM CHLORIDE 0.9% FLUSH
3.0000 mL | Freq: Two times a day (BID) | INTRAVENOUS | Status: DC
  Administered 2023-10-04 – 2023-10-07 (×6): 3 mL via INTRAVENOUS

## 2023-10-03 MED ORDER — SODIUM CHLORIDE 0.9% FLUSH: 3.0000 mL | Freq: Two times a day (BID) | INTRAVENOUS | Status: DC

## 2023-10-03 MED ORDER — ACETAMINOPHEN 325 MG PO TABS
650.0000 mg | ORAL_TABLET | ORAL | Status: DC | PRN
  Administered 2023-10-03 – 2023-10-06 (×6): 650 mg via ORAL
  Filled 2023-10-03 (×6): qty 2

## 2023-10-03 MED ORDER — INSULIN ASPART 100 UNIT/ML IJ SOLN
0.0000 [IU] | Freq: Three times a day (TID) | INTRAMUSCULAR | Status: DC
  Administered 2023-10-03 – 2023-10-04 (×2): 3 [IU] via SUBCUTANEOUS
  Administered 2023-10-04: 4 [IU] via SUBCUTANEOUS
  Administered 2023-10-04 – 2023-10-05 (×3): 3 [IU] via SUBCUTANEOUS
  Administered 2023-10-06: 7 [IU] via SUBCUTANEOUS
  Administered 2023-10-06: 4 [IU] via SUBCUTANEOUS

## 2023-10-03 MED ORDER — MIDAZOLAM HCL 2 MG/2ML IJ SOLN
INTRAMUSCULAR | Status: AC
Start: 2023-10-03 — End: ?
  Filled 2023-10-03: qty 2

## 2023-10-03 MED ORDER — NITROGLYCERIN 0.4 MG SL SUBL
0.4000 mg | SUBLINGUAL_TABLET | SUBLINGUAL | Status: DC | PRN
  Administered 2023-10-03 (×3): 0.4 mg via SUBLINGUAL
  Filled 2023-10-03: qty 1

## 2023-10-03 MED ORDER — SODIUM CHLORIDE 0.9% FLUSH: 3.0000 mL | INTRAVENOUS | Status: DC | PRN

## 2023-10-03 MED ORDER — INSULIN ASPART 100 UNIT/ML IJ SOLN: 0.0000 [IU] | Freq: Every day | INTRAMUSCULAR | Status: DC

## 2023-10-03 MED ORDER — NITROGLYCERIN IN D5W 200-5 MCG/ML-% IV SOLN
INTRAVENOUS | Status: AC
  Administered 2023-10-03: 70 ug/min via INTRAVENOUS
  Filled 2023-10-03: qty 250

## 2023-10-03 MED ORDER — NICOTINE 14 MG/24HR TD PT24: 14.0000 mg | MEDICATED_PATCH | Freq: Every day | TRANSDERMAL | Status: DC

## 2023-10-03 MED ORDER — LIDOCAINE HCL (PF) 1 % IJ SOLN
INTRAMUSCULAR | Status: DC | PRN
  Administered 2023-10-03: 5 mL via INTRADERMAL

## 2023-10-03 MED ORDER — LIDOCAINE HCL (PF) 1 % IJ SOLN
INTRAMUSCULAR | Status: AC
  Filled 2023-10-03: qty 30

## 2023-10-03 MED ORDER — HEPARIN SODIUM (PORCINE) 5000 UNIT/ML IJ SOLN
60.0000 [IU]/kg | Freq: Once | INTRAMUSCULAR | Status: DC
Start: 2023-10-03 — End: 2023-10-03

## 2023-10-03 MED ORDER — HEPARIN SODIUM (PORCINE) 1000 UNIT/ML IJ SOLN
INTRAMUSCULAR | Status: AC
  Filled 2023-10-03: qty 10

## 2023-10-03 MED ORDER — SODIUM CHLORIDE 0.9 % IV SOLN: 250.0000 mL | INTRAVENOUS | Status: DC | PRN

## 2023-10-03 MED ORDER — MORPHINE SULFATE (PF) 4 MG/ML IV SOLN
INTRAVENOUS | Status: AC
  Administered 2023-10-03: 4 mg via INTRAVENOUS
  Filled 2023-10-03: qty 1

## 2023-10-03 MED ORDER — MORPHINE SULFATE (PF) 2 MG/ML IV SOLN
2.0000 mg | INTRAVENOUS | Status: DC | PRN
  Administered 2023-10-06: 2 mg via INTRAVENOUS
  Filled 2023-10-03: qty 1

## 2023-10-03 MED ORDER — FENTANYL CITRATE (PF) 100 MCG/2ML IJ SOLN
INTRAMUSCULAR | Status: DC | PRN
  Administered 2023-10-03: 50 ug via INTRAVENOUS

## 2023-10-03 MED ORDER — SODIUM CHLORIDE 0.9 % IV SOLN
INTRAVENOUS | Status: AC | PRN
  Administered 2023-10-03: 10 mL/h via INTRAVENOUS

## 2023-10-03 MED ORDER — LORAZEPAM 2 MG/ML IJ SOLN
1.0000 mg | Freq: Once | INTRAMUSCULAR | Status: AC
  Administered 2023-10-03: 1 mg via INTRAVENOUS

## 2023-10-03 MED ORDER — LORAZEPAM 2 MG/ML IJ SOLN
INTRAMUSCULAR | Status: AC
  Filled 2023-10-03: qty 1

## 2023-10-03 MED ORDER — LABETALOL HCL 5 MG/ML IV SOLN: 10.0000 mg | INTRAVENOUS | Status: AC | PRN

## 2023-10-03 MED ORDER — ALBUTEROL SULFATE (2.5 MG/3ML) 0.083% IN NEBU: 2.5000 mg | INHALATION_SOLUTION | Freq: Four times a day (QID) | RESPIRATORY_TRACT | Status: DC | PRN

## 2023-10-03 MED ORDER — IOHEXOL 350 MG/ML SOLN
INTRAVENOUS | Status: DC | PRN
  Administered 2023-10-03: 70 mL

## 2023-10-03 MED ORDER — HEPARIN (PORCINE) IN NACL 1000-0.9 UT/500ML-% IV SOLN
INTRAVENOUS | Status: DC | PRN
  Administered 2023-10-03 (×2): 500 mL

## 2023-10-03 MED ORDER — ALUM & MAG HYDROXIDE-SIMETH 200-200-20 MG/5ML PO SUSP
30.0000 mL | ORAL | Status: DC | PRN
  Administered 2023-10-03: 30 mL via ORAL
  Filled 2023-10-03 (×2): qty 30

## 2023-10-03 MED ORDER — ALPRAZOLAM 0.5 MG PO TABS
1.0000 mg | ORAL_TABLET | Freq: Two times a day (BID) | ORAL | Status: DC | PRN
  Administered 2023-10-03 – 2023-10-11 (×14): 1 mg via ORAL
  Filled 2023-10-03 (×10): qty 2
  Filled 2023-10-03 (×2): qty 4
  Filled 2023-10-03 (×2): qty 2

## 2023-10-03 MED ORDER — HEPARIN (PORCINE) 25000 UT/250ML-% IV SOLN
INTRAVENOUS | Status: AC
  Administered 2023-10-03: 1300 [IU]/h via INTRAVENOUS
  Filled 2023-10-03: qty 250

## 2023-10-03 MED ORDER — MIDAZOLAM HCL 2 MG/2ML IJ SOLN
INTRAMUSCULAR | Status: DC | PRN
  Administered 2023-10-03: 2 mg via INTRAVENOUS

## 2023-10-03 MED ORDER — HEPARIN (PORCINE) 25000 UT/250ML-% IV SOLN
1300.0000 [IU]/h | INTRAVENOUS | Status: DC
  Administered 2023-10-03: 1300 [IU]/h via INTRAVENOUS

## 2023-10-03 MED ORDER — INSULIN ASPART 100 UNIT/ML IJ SOLN
6.0000 [IU] | Freq: Three times a day (TID) | INTRAMUSCULAR | Status: DC
  Administered 2023-10-03 – 2023-10-06 (×10): 6 [IU] via SUBCUTANEOUS

## 2023-10-03 MED ORDER — FENTANYL CITRATE (PF) 100 MCG/2ML IJ SOLN
INTRAMUSCULAR | Status: AC
  Filled 2023-10-03: qty 2

## 2023-10-03 MED ORDER — HEPARIN BOLUS VIA INFUSION
4000.0000 [IU] | Freq: Once | INTRAVENOUS | Status: AC
  Administered 2023-10-03: 4000 [IU] via INTRAVENOUS

## 2023-10-03 MED ORDER — ONDANSETRON HCL 4 MG/2ML IJ SOLN: 4.0000 mg | Freq: Four times a day (QID) | INTRAMUSCULAR | Status: DC | PRN

## 2023-10-03 MED ORDER — NITROGLYCERIN IN D5W 200-5 MCG/ML-% IV SOLN
0.0000 ug/min | INTRAVENOUS | Status: DC
  Administered 2023-10-03: 10 ug/min via INTRAVENOUS
  Administered 2023-10-04: 30 ug/min via INTRAVENOUS
  Administered 2023-10-04: 70 ug/min via INTRAVENOUS
  Administered 2023-10-06: 40 ug/min via INTRAVENOUS
  Administered 2023-10-06: 55 ug/min via INTRAVENOUS
  Administered 2023-10-06: 50 ug/min via INTRAVENOUS
  Administered 2023-10-06: 45 ug/min via INTRAVENOUS
  Filled 2023-10-03 (×5): qty 250

## 2023-10-03 MED ORDER — ASPIRIN 81 MG PO CHEW
324.0000 mg | CHEWABLE_TABLET | Freq: Once | ORAL | Status: AC
  Administered 2023-10-03: 324 mg via ORAL
  Filled 2023-10-03: qty 4

## 2023-10-03 MED ORDER — SODIUM CHLORIDE 0.9 % IV SOLN: INTRAVENOUS | Status: AC

## 2023-10-03 MED ORDER — HYDRALAZINE HCL 20 MG/ML IJ SOLN: 10.0000 mg | INTRAMUSCULAR | Status: AC | PRN

## 2023-10-03 MED ORDER — ATORVASTATIN CALCIUM 80 MG PO TABS
80.0000 mg | ORAL_TABLET | Freq: Every day | ORAL | Status: DC
  Administered 2023-10-03 – 2023-10-12 (×9): 80 mg via ORAL
  Filled 2023-10-03 (×8): qty 1
  Filled 2023-10-03: qty 2

## 2023-10-03 MED ORDER — MORPHINE SULFATE (PF) 4 MG/ML IV SOLN: 4.0000 mg | Freq: Once | INTRAVENOUS | Status: AC

## 2023-10-03 MED ORDER — NICOTINE 14 MG/24HR TD PT24
14.0000 mg | MEDICATED_PATCH | Freq: Every day | TRANSDERMAL | Status: DC
  Administered 2023-10-03 – 2023-10-12 (×10): 14 mg via TRANSDERMAL
  Filled 2023-10-03 (×10): qty 1

## 2023-10-03 MED ORDER — HEPARIN SODIUM (PORCINE) 1000 UNIT/ML IJ SOLN
INTRAMUSCULAR | Status: DC | PRN
  Administered 2023-10-03: 5000 [IU] via INTRAVENOUS

## 2023-10-03 MED ORDER — LORAZEPAM 2 MG/ML IJ SOLN
1.0000 mg | Freq: Once | INTRAMUSCULAR | Status: AC
  Administered 2023-10-03: 1 mg via INTRAVENOUS
  Filled 2023-10-03: qty 1

## 2023-10-03 MED ORDER — NITROGLYCERIN IN D5W 200-5 MCG/ML-% IV SOLN
INTRAVENOUS | Status: AC
  Administered 2023-10-03: 10 ug/min via INTRAVENOUS
  Filled 2023-10-03: qty 250

## 2023-10-03 MED ORDER — VERAPAMIL HCL 2.5 MG/ML IV SOLN
INTRAVENOUS | Status: AC
  Filled 2023-10-03: qty 2

## 2023-10-03 MED ORDER — HEPARIN (PORCINE) 25000 UT/250ML-% IV SOLN
2600.0000 [IU]/h | INTRAVENOUS | Status: DC
  Administered 2023-10-04: 1600 [IU]/h via INTRAVENOUS
  Administered 2023-10-04: 1800 [IU]/h via INTRAVENOUS
  Administered 2023-10-05: 2000 [IU]/h via INTRAVENOUS
  Administered 2023-10-06 – 2023-10-07 (×3): 2600 [IU]/h via INTRAVENOUS
  Filled 2023-10-03 (×8): qty 250

## 2023-10-03 MED ORDER — HEPARIN (PORCINE) 25000 UT/250ML-% IV SOLN: 12.0000 [IU]/kg/h | INTRAVENOUS | Status: DC

## 2023-10-03 SURGICAL SUPPLY — 10 items
CATH INFINITI AMBI 5FR TG (CATHETERS) IMPLANT
DEVICE RAD COMP TR BAND LRG (VASCULAR PRODUCTS) IMPLANT
GLIDESHEATH SLEND SS 6F .021 (SHEATH) IMPLANT
GUIDEWIRE INQWIRE 1.5J.035X260 (WIRE) IMPLANT
INQWIRE 1.5J .035X260CM (WIRE) ×1
MAT PREVALON FULL STRYKER (MISCELLANEOUS) IMPLANT
PACK CARDIAC CATHETERIZATION (CUSTOM PROCEDURE TRAY) ×1 IMPLANT
SET ATX-X65L (MISCELLANEOUS) IMPLANT
SHEATH PROBE COVER 6X72 (BAG) IMPLANT
TUBING CIL FLEX 10 FLL-RA (TUBING) IMPLANT

## 2023-10-03 NOTE — Plan of Care (Signed)
  Problem: Coping: Goal: Ability to adjust to condition or change in health will improve Outcome: Progressing   Problem: Fluid Volume: Goal: Ability to maintain a balanced intake and output will improve Outcome: Progressing   Problem: Metabolic: Goal: Ability to maintain appropriate glucose levels will improve Outcome: Progressing   Problem: Skin Integrity: Goal: Risk for impaired skin integrity will decrease Outcome: Progressing   Problem: Tissue Perfusion: Goal: Adequacy of tissue perfusion will improve Outcome: Progressing   Problem: Health Behavior/Discharge Planning: Goal: Ability to manage health-related needs will improve Outcome: Progressing   Problem: Clinical Measurements: Goal: Ability to maintain clinical measurements within normal limits will improve Outcome: Progressing

## 2023-10-03 NOTE — ED Notes (Signed)
Out with carelink, alert, NAD, calm, interactive, skin dry, NSR, VSS, BP elevated.

## 2023-10-03 NOTE — CV Procedure (Addendum)
   BRIEF CARDIAC CATHETERIZATION REPORT  Primary Care Provider: Bess Kinds, MD Primary Cardiologist: Dr. Diona Browner (Former)   Chief Complaint:  NSTEMI     Edwin Ford is a 42 y.o. male   with a history of hypertension, hyperlipidemia, type 2 diabetes, and significant obesity, presents with a new onset NSTEMI. He Ruled in for non-STEMI with elevated troponins, has ongoing chest pain despite aggressive management with nitroglycerin.  Recommended urgent non--STEMI catheterization   Time Out: Verified patient identification, verified procedure, site/side was marked, verified correct patient position, special equipment/implants available, medications/allergies/relevent history reviewed, required imaging and test results available. Performed.  Access:  * RIGHT Radial Artery: 6 Fr sheath -- Seldinger technique using Micropuncture Kit -- Direct ultrasound guidance used.  Permanent image obtained and placed on chart. -- 10 mL radial cocktail IA; 9,000 Units IV Heparin  Left Heart Catheterization & Coronary Angiography: 5Fr TIG 4.0 Catheter was advanced or exchanged over a J-wire under direct fluoroscopic guidance into the ascending aorta; the catheter was advanced across the aortic valve first for hemodynamics and then pullback gradient.  It was then used to engage first the RIGHT than the LEFT Coronary Artery for selective coronary cineangiography.  Review of initial angiography revealed: Significant multivessel disease  Preparations are made for medical management and CVTS consultation  Upon completion of Angiogaphy, the catheter was removed completely out of the body over a wire, without complication.  Radial sheath removed in the Cardiac Catheterization lab with TR Band placed for hemostasis.  TR Band: 1535  Hours; 11 mL air; RB B  MEDICATIONS SQ Lidocaine 3 mL Radial Cocktail: 3 mg Verapmil in 10 mL NS Heparin: 13,000 Units  POST-CATH DIAGNOSES Severe multivessel CAD involving  75% mid RCA and 99% RCA-PDA-PLV bifurcation, tandem lesions in the proximal to mid LCx 80% and 70%-with occluded SVG LCx-LPL 1, 99% proximal RI (culprit), 70% proximal LAD (eccentric ulcerated) Moderately elevated LVEDP-with low normal EF on Echo of 50 to 55% (anterolateral hypokinesis.)    RECOMMENDATIONS Patient will need CVTS consultation Will restart IV heparin 2 hours after TR band removal Continue IV nitroglycerin Continue aggressive RF management with high-dose statin, glycemic control, and beta-blocker/ARB   Marykay Lex, MD, MS Bryan Lemma, M.D., M.S. Interventional Cardiologist  [facility]  Pager # [phone] Phone # [phone] [address] [place], [place] [address]

## 2023-10-03 NOTE — ED Triage Notes (Signed)
The patient having severe chest pain for 30 minutes. He is diaphoretic in triage. He denied SHOB or cough.

## 2023-10-03 NOTE — ED Triage Notes (Signed)
Transfer from [facility] with [facility] for NSTEMI with cardiology evaluation. PT transported on Heparin and Nitro drip.

## 2023-10-03 NOTE — Progress Notes (Signed)
  Echocardiogram 2D Echocardiogram has been performed.  Delcie Roch 10/03/2023, 12:35 PM

## 2023-10-03 NOTE — Progress Notes (Signed)
PHARMACY - ANTICOAGULATION CONSULT NOTE  Pharmacy Consult for heparin Indication: chest pain/ACS  Allergies  Allergen Reactions   Bee Pollen     Other reaction(s): Other (See Comments) Sinus drainage Other reaction(s): Other (See Comments) Other reaction(s): Other (See Comments) Sinus drainage Sinus drainage    Pollen Extract Other (See Comments)    Sinus drainage    Patient Measurements: Height: 6\' 1"  (185.4 cm) Weight: 113.4 kg (250 lb) IBW/kg (Calculated) : 79.9 Heparin Dosing Weight: 103   Vital Signs: Temp: 99.5 F (37.5 C) (11/16 0843) Temp Source: Oral (11/16 0843) BP: 168/110 (11/16 0915) Pulse Rate: 78 (11/16 0915)  Labs: Recent Labs    10/03/23 0840  HGB 16.3  HCT 44.7  PLT 188  CREATININE 0.92  TROPONINIHS 491*    Estimated Creatinine Clearance: 135.2 mL/min (by C-G formula based on SCr of 0.92 mg/dL).   Medical History: Past Medical History:  Diagnosis Date   Allergies    Anxiety    Asthma    Depression    Essential hypertension    GERD (gastroesophageal reflux disease)    Hyperlipidemia    Type 2 diabetes mellitus (HCC)     Assessment: Edwin Ford presenting with CP, he is not on anticoagulation PTA  Goal of Therapy:  Heparin level 0.3-0.7 units/ml Monitor platelets by anticoagulation protocol: Yes   Plan:  Heparin 4000 units IV x 1, and gtt at 1300 units/hr F/u 6 hour heparin level F/u cards eval and recs  Daylene Posey, PharmD, [facility] Clinical Pharmacist ED Pharmacist Phone # [phone] 10/03/2023 9:37 AM

## 2023-10-03 NOTE — ED Notes (Signed)
Calmer, drier, reports improved pain, rates 5/10, attempting to contact family via text, VSS, BP improved, remains elevated. Report given to [facility], pending arrival.

## 2023-10-03 NOTE — ED Notes (Signed)
ED Provider at bedside. 

## 2023-10-03 NOTE — ED Notes (Signed)
Repeat Ativan 1mg  IV ordered for transport, pending transport arrival.

## 2023-10-03 NOTE — ED Notes (Signed)
EDP at BS 

## 2023-10-03 NOTE — ED Provider Notes (Signed)
[facility] Provider Note   CSN: [id number] Arrival date & time: 10/03/23  0865     History  Chief Complaint  Patient presents with   Chest Pain    Edwin Ford is a 42 y.o. male.  With a past history of hypertension and diabetes who presents to the ED for chest pain.  He reports substernal chest pain began around 0800 this morning while he was laying in bed.  It has persisted since the onset and radiates to both arms.  Associated diaphoresis.  No shortness of breath, nausea, vomiting or abdominal pain.  Has had prior episodes of similar chest pain in the past.  No alcohol use.  Uses marijuana regularly.  Last cocaine use about a month ago.  No known history of early onset cardiac issues.  Patient mentions he had a similar episode of chest pain 2 days ago but did not seek evaluation at that time   Chest Pain      Home Medications Prior to Admission medications   Medication Sig Start Date End Date Taking? Authorizing Provider  albuterol (PROVENTIL HFA) 108 (90 Base) MCG/ACT inhaler INHALE 2 PUFFS BY MOUTH EVERY 6 HOURS AS NEEDED FOR COUGHING, WHEEZING, OR SHORTNESS OF BREATH 05/23/21   Jacquelin Hawking, PA-C  Alcohol Swabstick 70 % PADS Clean finger with swab before testing blood sugar 04/14/22   Bess Kinds, MD  alprazolam Prudy Feeler) 2 MG tablet Take 2 mg by mouth 3 (three) times daily as needed for sleep.    [provider]  amLODipine (NORVASC) 10 MG tablet TAKE 1 Tablet BY MOUTH ONCE EVERY DAY 05/30/23   Long, Arlyss Repress, MD  atorvastatin (LIPITOR) 20 MG tablet TAKE 1 Tablet BY MOUTH ONCE EVERY DAY 05/30/23   Long, Arlyss Repress, MD  blood glucose meter kit and supplies KIT Dispense based on patient and insurance preference. Use up to four times daily as directed. 04/14/22   Bess Kinds, MD  Blood Pressure Monitoring (BLOOD PRESSURE CUFF) MISC Take BP 3-4 times weekly. 07/15/22   Carlisle Beers, FNP  clonazePAM (KLONOPIN) 1  MG tablet Take 1 mg by mouth 3 (three) times daily as needed for anxiety.    [provider]  insulin glargine (LANTUS SOLOSTAR) 100 UNIT/ML Solostar Pen Inject 30 Units into the skin daily. 08/19/22   Bess Kinds, MD  lisinopril (ZESTRIL) 40 MG tablet TAKE 1 Tablet BY MOUTH ONCE EVERY DAY 05/30/23   Long, Arlyss Repress, MD  metFORMIN (GLUCOPHAGE-XR) 500 MG 24 hr tablet STARTING 6/5 - 1000 MG AM AND 500 MG PM STARTING 6/12 - 1000 MG AM AND 1000 MG PM 02/02/23   Bess Kinds, MD  metoprolol tartrate (LOPRESSOR) 100 MG tablet Take 1 tablet (100 mg total) by mouth 2 (two) times daily. 05/30/23 08/28/23  Long, Arlyss Repress, MD  omeprazole (PRILOSEC) 20 MG capsule Take 1 capsule (20 mg total) by mouth daily. 05/20/23   Bess Kinds, MD  sertraline (ZOLOFT) 100 MG tablet Take 2 tablets (200 mg total) by mouth daily. 03/12/17   Jacquelin Hawking, PA-C  sertraline (ZOLOFT) 50 MG tablet Take 50 mg by mouth daily.    [provider]  sitaGLIPtin (JANUVIA) 100 MG tablet TAKE 1 Tablet BY MOUTH ONCE EVERY DAY 05/23/21   Jacquelin Hawking, PA-C      Allergies    Bee pollen and Pollen extract    Review of Systems   Review of Systems  Cardiovascular:  Positive for chest  pain.    Physical Exam Updated Vital Signs BP (!) 159/113   Pulse 78   Temp 99.5 F (37.5 C) (Oral)   Resp 18   Ht 6\' 1"  (1.854 m)   Wt 113.4 kg   SpO2 100%   BMI 32.98 kg/m  Physical Exam Vitals and nursing note reviewed.  Constitutional:      Comments: Uncomfortable appearing, diaphoretic  HENT:     Head: Normocephalic and atraumatic.  Eyes:     Pupils: Pupils are equal, round, and reactive to light.  Cardiovascular:     Rate and Rhythm: Normal rate and regular rhythm.  Pulmonary:     Effort: Pulmonary effort is normal.     Breath sounds: Normal breath sounds.  Abdominal:     Palpations: Abdomen is soft.     Tenderness: There is no abdominal tenderness.  Skin:    General: Skin is warm and dry.  Neurological:      Mental Status: He is alert.  Psychiatric:        Mood and Affect: Mood normal.     ED Results / Procedures / Treatments   Labs (all labs ordered are listed, but only abnormal results are displayed) Labs Reviewed  COMPREHENSIVE METABOLIC PANEL - Abnormal; Notable for the following components:      Result Value   Glucose, Bld 231 (*)    All other components within normal limits  CBC WITH DIFFERENTIAL/PLATELET - Abnormal; Notable for the following components:   MCHC 36.5 (*)    All other components within normal limits  TROPONIN I (HIGH SENSITIVITY) - Abnormal; Notable for the following components:   Troponin I (High Sensitivity) 491 (*)    All other components within normal limits  HEPARIN LEVEL (UNFRACTIONATED)  CBG MONITORING, ED  TROPONIN I (HIGH SENSITIVITY)    EKG EKG Interpretation Date/Time:  Saturday October 03 2023 09:15:20 EST Ventricular Rate:  77 PR Interval:  128 QRS Duration:  141 QT Interval:  394 QTC Calculation: 446 R Axis:   194  Text Interpretation: Sinus rhythm IVCD, consider atypical RBBB Confirmed by Estelle June [phone]) on 10/03/2023 10:05:46 AM  Radiology DG Chest Portable 1 View  Result Date: 10/03/2023 CLINICAL DATA:  Chest pain. EXAM: PORTABLE CHEST 1 VIEW COMPARISON:  One-view chest x-ray scratched at two-view chest x-ray 05/30/2023 FINDINGS: Heart size is normal. Minimal airspace disease is present at the left base. Lung volumes are low. No other focal airspace disease is present. No significant pleural effusion or pneumothorax is present. The visualized soft tissues and bony thorax are unremarkable. IMPRESSION: Minimal airspace disease at the left base may represent atelectasis or early pneumonia. Electronically Signed   By: Marin Roberts M.D.   On: 10/03/2023 09:07    Procedures .Critical Care  Performed by: Royanne Foots, DO Authorized by: Royanne Foots, DO   Critical care provider statement:    Critical care time  (minutes):  80   Critical care time was exclusive of:  Separately billable procedures and treating other patients and teaching time   Critical care was necessary to treat or prevent imminent or life-threatening deterioration of the following conditions:  Cardiac failure   Critical care was time spent personally by me on the following activities:  Blood draw for specimens, ordering and performing treatments and interventions, development of treatment plan with patient or surrogate, discussions with consultants, pulse oximetry, ordering and review of radiographic studies, evaluation of patient's response to treatment, re-evaluation of patient's condition, review of old  charts, examination of patient and interpretation of cardiac output measurements   I assumed direction of critical care for this patient from another provider in my specialty: no     Care discussed with: admitting provider   Comments:     Care discussed with Dr. Cristal Deer (cardiology)     Medications Ordered in ED Medications  nitroGLYCERIN (NITROSTAT) SL tablet 0.4 mg (0.4 mg Sublingual Given 10/03/23 0917)  nitroGLYCERIN 50 mg in dextrose 5 % 250 mL (0.2 mg/mL) infusion (50 mcg/min Intravenous Rate/Dose Change 10/03/23 0955)  heparin ADULT infusion 100 units/mL (25000 units/222mL) (1,300 Units/hr Intravenous New Bag/Given 10/03/23 0943)  morphine (PF) 4 MG/ML injection 4 mg (has no administration in time range)  aspirin chewable tablet 324 mg (324 mg Oral Given 10/03/23 0842)  LORazepam (ATIVAN) injection 1 mg (1 mg Intravenous Given 10/03/23 0922)  heparin bolus via infusion 4,000 Units (4,000 Units Intravenous Bolus from Bag 10/03/23 0946)  LORazepam (ATIVAN) injection 1 mg (1 mg Intravenous Given 10/03/23 1017)    ED Course/ Medical Decision Making/ A&P Clinical Course as of 10/03/23 1022  Sat Oct 03, 2023  3875 Chest pain somewhat improved after 2 doses of sublingual nitro but still present.  Discussed with Dr.  Cristal Deer (cardiology on-call) who reviewed the EKGs.  No STEMI but recommends treatment for NSTEMI with heparin bolus heparin drip, and initiating nitro drip since that helped with chest pain.  Patient will be admitted to cardiology service.  We will transfer ED to ED in order to get him to the [facility] facility in the fastest way possible. [MP]  [phone] Dr Rodena Medin (ED) accepts pt in transfer [MP]  1006  Understandably, patient is a very anxious.  Will provide a small dose of IV Ativan prior to transfer.  [facility] transport team is en route [MP]  1022 [facility] has arrived.  Patient has remained stable at this point.  Will give a dose of morphine prior to transport. [MP]    Clinical Course User Index [MP] Royanne Foots, DO                                 Medical Decision Making 42 year old male with history as above presenting for acute onset chest pain.  Substernal chest pain with radiation to both arms.  Associated diaphoresis.  Uncomfortable appearing, diaphoretic and hypertensive on exam.  No acute ischemic changes on EKG compared to previous EKG.  Presentation most concerning for ACS.  Will obtain high sensitive troponin, laboratory workup and provide 324 mg p.o. aspirin and sublingual nitro and reassess.  Other differentials include pneumonia, GERD, costochondritis.  Lower suspicion for acute aortic dissection but could consider imaging if his symptoms do not improve or he appears clinically worsened.  Amount and/or Complexity of Data Reviewed Labs: ordered. Radiology: ordered.  Risk OTC drugs. Prescription drug management.           Final Clinical Impression(s) / ED Diagnoses Final diagnoses:  NSTEMI (non-ST elevated myocardial infarction) (HCC)  Chest pain, unspecified type  Type 2 diabetes mellitus without complication, unspecified whether long term insulin use (HCC)  Secondary hypertension    Rx / DC Orders ED Discharge Orders     None         Royanne Foots, DO 10/03/23 1022

## 2023-10-03 NOTE — Interval H&P Note (Signed)
History and Physical Interval Note:  10/03/2023 2:45 PM  Edwin Ford  has presented today for surgery, with the diagnosis of Non-STEMI.   Ruled in for non-STEMI with elevated troponins, has ongoing chest pain despite aggressive management with nitroglycerin.  Recommended urgent non--STEMI catheterization   The various methods of treatment have been discussed with the patient and family. After consideration of risks, benefits and other options for treatment, the patient has consented to  Procedure(s): Coronary/Graft Acute MI Revascularization (N/A) LEFT HEART CATH AND CORONARY ANGIOGRAPHY (N/A) PERCUTANEOUS CORONARY ITNERVENTION   as a surgical intervention.  The patient's history has been reviewed, patient examined, no change in status, stable for surgery.  I have reviewed the patient's chart and labs.  Questions were answered to the patient's satisfaction.    Cath Lab Visit (complete for each Cath Lab visit)  Clinical Evaluation Leading to the Procedure:   ACS: Yes.    Non-ACS:    Anginal Classification: CCS IV  Anti-ischemic medical therapy: Maximal Therapy (2 or more classes of medications)  Non-Invasive Test Results: No non-invasive testing performed  Prior CABG: No previous CABG   Bryan Lemma

## 2023-10-03 NOTE — ED Notes (Signed)
Called [facility] for transport at 9:38.

## 2023-10-03 NOTE — ED Provider Notes (Signed)
Patient transferred from [facility] for active chest pain.  Troponins are elevated but not rising.  Cardiology Dr. Cristal Deer has been contacted.  Dr. Herbie Baltimore now at bedside and states he is going to take him to the Cath Lab due to his ongoing pain.  Anticipate will need cardiology admission   Terrilee Files, MD 10/03/23 [phone]

## 2023-10-03 NOTE — Progress Notes (Signed)
PHARMACY - ANTICOAGULATION CONSULT NOTE  Pharmacy Consult for heparin Indication: chest pain/ACS  Allergies  Allergen Reactions   Bee Pollen     Other reaction(s): Other (See Comments) Sinus drainage Other reaction(s): Other (See Comments) Other reaction(s): Other (See Comments) Sinus drainage Sinus drainage    Pollen Extract Other (See Comments)    Sinus drainage    Patient Measurements: Height: 6\' 1"  (185.4 cm) Weight: 113.4 kg (250 lb) IBW/kg (Calculated) : 79.9 Heparin Dosing Weight: 103   Vital Signs: Temp: 98.1 F (36.7 C) (11/16 1645) Temp Source: Oral (11/16 1645) BP: 135/92 (11/16 1815) Pulse Rate: 87 (11/16 1815)  Labs: Recent Labs    10/03/23 0840 10/03/23 1016  HGB 16.3  --   HCT 44.7  --   PLT 188  --   CREATININE 0.92  --   TROPONINIHS 491* 290*    Estimated Creatinine Clearance: 135.2 mL/min (by C-G formula based on SCr of 0.92 mg/dL).   Medical History: Past Medical History:  Diagnosis Date   Allergies    Anxiety    Asthma    Depression    Essential hypertension    GERD (gastroesophageal reflux disease)    Hyperlipidemia    Type 2 diabetes mellitus (HCC)     Assessment: 25 YOM presenting with CP, he is not on anticoagulation PTA.  Pt s/p LHC with mvCAD. Pharmacy to resume heparin 2h after TR band removal (deflated ~1830).  Goal of Therapy:  Heparin level 0.3-0.7 units/ml Monitor platelets by anticoagulation protocol: Yes   Plan:  Resume heparin 1300 units/h no bolus at 2030 Check heparin level 6h after restarting  Fredonia Highland, PharmD, BCPS, [facility] Clinical Pharmacist [phone] Please check AMION for all [facility] Pharmacy numbers 10/03/2023

## 2023-10-03 NOTE — H&P (Signed)
Cardiology Admission History and Physical :   Patient ID: Edwin Ford; MRN: [id number]; DOB: 1980-12-19   Admission date: 10/03/2023  Primary Care Provider: Bess Kinds, MD Primary Cardiologist: Dr. Diona Browner (Former)  Chief Complaint:  NSTEMI   Patient Profile:   Edwin Ford is a 42 y.o. male   with a history of hypertension, hyperlipidemia, type 2 diabetes, and significant obesity, presents with a new onset NSTEMI.  History of Present Illness:   [name] is feeling terrible.  He has a family history of premature coronary artery disease. He has lost about 30 pounds and has been followed by a cardiologist in the past (family history of early CAD).  At that time,  His last LDL was 57, and his blood pressure was within normal limits. However, his blood sugar is currently 231, and his recent LDL was 260.  He has moved to [facility]- he does not remember seeing his cardiologist is quite some time.   He presented with central chest pain radiating to the arm, shortness of breath, and diaphoresis. He described the feeling as if he were dying. The chest pain has improved marginally but is still present. He denies current shortness of breath. He has a smoking history and has not followed up with his cardiologist in some time.  His EKG shows a sinus rhythm with a right bundle branch block, ST depressions in the inferior leads, and T wave and ST depressions in lead V3. These changes are new compared to an EKG from 2017. His troponin levels have risen from 7 to 491.  On physical examination, he has distant heart sounds likely due to significant obesity. His extremities are cool but not clammy, and his blood pressure is 152/90. He has no hypotension.  I have increased his nitrodrip to 45 and he still have 10/10 pain.  Allergies:    Allergies  Allergen Reactions   Bee Pollen     Other reaction(s): Other (See Comments) Sinus drainage Other reaction(s): Other (See Comments) Other  reaction(s): Other (See Comments) Sinus drainage Sinus drainage    Pollen Extract Other (See Comments)    Sinus drainage    Social History:   Social History   Socioeconomic History   Marital status: Single    Spouse name: Not on file   Number of children: Not on file   Years of education: Not on file   Highest education level: Not on file  Occupational History   Not on file  Tobacco Use   Smoking status: Every Day    Current packs/day: 1.00    Average packs/day: 1 pack/day for 28.9 years (28.9 ttl pk-yrs)    Types: Cigarettes    Start date: 11/17/1994   Smokeless tobacco: Former    Types: Chew  Substance and Sexual Activity   Alcohol use: No   Drug use: Yes    Frequency: 7.0 times per week    Types: Marijuana    Comment: daily use   Sexual activity: Not Currently  Other Topics Concern   Not on file  Social History Narrative   Not on file   Social Determinants of Health   Financial Resource Strain: Not on File (07/11/2021)   Received from [employer], [employer]    Financial Resource Strain: 0  Food Insecurity: Not on File (08/13/2023)   Received from [facility] Insecurity    Food: 0  Transportation Needs: Not on File (07/11/2021)   Received from [place],  [facility]   Transportation Needs    Transportation: 0  Physical Activity: Not on File (07/11/2021)   Received from [place], [place]   Physical Activity    Physical Activity: 0  Stress: Not on File (07/11/2021)   Received from [facility], [place]   Stress    Stress: 0  Social Connections: Not on File (08/01/2023)   Received from [employer] [facility]   Social Connections    Connectedness: 0  Intimate Partner Violence: Not on file    Family History:   The patient's family history includes CAD in his mother; Depression in his father; Diabetes Mellitus II in his father and mother; High Cholesterol in his father and mother; Hypertension in his father and mother; Kidney disease in his father.    ROS:  Please see the history  of present illness.   Physical Exam/Data:   Vitals:   10/03/23 1015 10/03/23 1020 10/03/23 1059 10/03/23 1130  BP:   (!) 156/96 (!) 152/94  Pulse: 73 80 77 72  Resp:   10 19  Temp:   97.9 F (36.6 C)   TempSrc:   Oral   SpO2: 99% 100% 100% 100%  Weight:      Height:        Intake/Output Summary (Last 24 hours) at 10/03/2023 1147 Last data filed at 10/03/2023 1146 Gross per 24 hour  Intake 76.43 ml  Output --  Net 76.43 ml   Filed Weights   10/03/23 0932  Weight: 113.4 kg   Body mass index is 32.98 kg/m.   Gen: In distress, Obese  Neck: No JVD Ears: Bilateral Homero Fellers Sign Cardiac: No Rubs or Gallops, no murmur, RRR +2 radial pulses Respiratory: Clear to auscultation bilaterally, normal effort, normal  respiratory rate GI: Soft, nontender, non-distended  MS: No  edema;  moves all extremities Integument: Skin feels warm  Neuro:  At time of evaluation, alert and oriented to person/place/time/situation  Psych: Normal affect, patient feels   EKG:  The ECG that was done  was personally reviewed and demonstrates  SR with new RBBB.  ST depressions in III and aVF, V3 and V4.  RBBB and ST changes are new.     Chemistry Recent Labs  Lab 10/03/23 0840  NA 135  K 3.8  CL 100  CO2 25  GLUCOSE 231*  BUN 9  CREATININE 0.92  CALCIUM 9.4  GFRNONAA >60  ANIONGAP 10    Recent Labs  Lab 10/03/23 0840  PROT 7.5  ALBUMIN 3.9  AST 21  ALT 18  ALKPHOS 76  BILITOT 0.6   Hematology Recent Labs  Lab 10/03/23 0840  WBC 7.3  RBC 5.30  HGB 16.3  HCT 44.7  MCV 84.3  MCH 30.8  MCHC 36.5*  RDW 12.4  PLT 188    Assessment and Plan:    Non-ST Elevation Myocardial Infarction (NSTEMI) - New onset NSTEMI with central chest pain radiating to the arm, marginal improvement in pain, no shortness of breath. Troponin levels increased from 7 to 491. EKG shows sinus rhythm with right bundle branch block, ST depressions in inferior leads, and J point elevation in leads I and  AVL (improved on repeat EKG I ordered at 11). Persistent chest pain despite initial management. Informed consent obtained for left and right heart catheterization, including discussion of risks (e.g., bleeding, infection, myocardial infarction), benefits (e.g., definitive diagnosis, potential for intervention), and alternatives (e.g., medical management, non-invasive imaging). - Reviewed case with Dr. Herbie Baltimore interventionalist for heart catheterization, STAT indication CP despite medical  therapy - Order echocardiogram - Increase nitroglycerin drip  Hypertension - Blood pressure recorded at 152/90 mmHg. Requires ongoing management; safe to give higher drip  Hyperlipidemia - Recent LDL level at 260 mg/dL. Family history of premature coronary artery disease. - atorvastatin 80 mg  Type 2 Diabetes Mellitus - Blood sugar level at 231 mg/dL. Requires glucose management support. - SSI; Consult glucose management team for blood sugar control  Obesity Significant obesity with distant heart sounds on exam. History of weight loss (30 pounds). - with NSTEMI he is an outpatient consideration for GLP-RA therapy  Smoking Cessation Patient smokes daily. Requires smoking cessation support. - Offer nicotine replacement patch  Full Code Heparin -> lovenox PPX after cath NPO-> Cardiac carb consistent diet post cath        Severity of Illness: The appropriate patient status for this patient is INPATIENT. Inpatient status is judged to be reasonable and necessary in order to provide the required intensity of service to ensure the patient's safety. The patient's presenting symptoms, physical exam findings, and initial radiographic and laboratory data in the context of their chronic comorbidities is felt to place them at high risk for further clinical deterioration. Furthermore, it is not anticipated that the patient will be medically stable for discharge from the hospital within 2 midnights of admission.    * I certify that at the point of admission it is my clinical judgment that the patient will require inpatient hospital care spanning beyond 2 midnights from the point of admission due to high intensity of service, high risk for further deterioration and high frequency of surveillance required.*   For questions or updates, please contact [facility] Please consult [email or web address] for contact info under Cardiology/STEMI.   Riley Lam, MD FASE [facility] Cardiologist [facility]  [address] [place], [address] [place], [place] [address] [phone]  11:47 AM

## 2023-10-03 NOTE — ED Notes (Signed)
Feels better with BS fan, pain decreased after 1st ntg

## 2023-10-03 NOTE — ED Notes (Addendum)
Critical result troponin 290, MD Charm Barges notified.

## 2023-10-04 DIAGNOSIS — I214 Non-ST elevation (NSTEMI) myocardial infarction: Secondary | ICD-10-CM | POA: Diagnosis not present

## 2023-10-04 LAB — CBC
HCT: 36.6 % — ABNORMAL LOW (ref 39.0–52.0)
Hemoglobin: 13.2 g/dL (ref 13.0–17.0)
MCH: 30.3 pg (ref 26.0–34.0)
MCHC: 36.1 g/dL — ABNORMAL HIGH (ref 30.0–36.0)
MCV: 83.9 fL (ref 80.0–100.0)
Platelets: 154 10*3/uL (ref 150–400)
RBC: 4.36 MIL/uL (ref 4.22–5.81)
RDW: 12.3 % (ref 11.5–15.5)
WBC: 11.1 10*3/uL — ABNORMAL HIGH (ref 4.0–10.5)
nRBC: 0 % (ref 0.0–0.2)

## 2023-10-04 LAB — GLUCOSE, CAPILLARY
Glucose-Capillary: 193 mg/dL — ABNORMAL HIGH (ref 70–99)
Glucose-Capillary: 139 mg/dL — ABNORMAL HIGH (ref 70–99)
Glucose-Capillary: 125 mg/dL — ABNORMAL HIGH (ref 70–99)
Glucose-Capillary: 105 mg/dL — ABNORMAL HIGH (ref 70–99)

## 2023-10-04 LAB — HEPARIN LEVEL (UNFRACTIONATED)
Heparin Unfractionated: <0.1 [IU]/mL — ABNORMAL LOW (ref 0.30–0.70)
Heparin Unfractionated: <0.1 [IU]/mL — ABNORMAL LOW (ref 0.30–0.70)

## 2023-10-04 MED ORDER — SODIUM CHLORIDE 0.9% FLUSH: 10.0000 mL | Freq: Two times a day (BID) | INTRAVENOUS | Status: DC

## 2023-10-04 MED ORDER — EZETIMIBE 10 MG PO TABS
10.0000 mg | ORAL_TABLET | Freq: Every day | ORAL | Status: DC
  Administered 2023-10-04 – 2023-10-12 (×8): 10 mg via ORAL
  Filled 2023-10-04 (×8): qty 1

## 2023-10-04 MED ORDER — HEPARIN BOLUS VIA INFUSION
3000.0000 [IU] | Freq: Once | INTRAVENOUS | Status: AC
  Administered 2023-10-04: 3000 [IU] via INTRAVENOUS
  Filled 2023-10-04: qty 3000

## 2023-10-04 MED ORDER — METOPROLOL TARTRATE 25 MG PO TABS
25.0000 mg | ORAL_TABLET | Freq: Two times a day (BID) | ORAL | Status: DC
  Administered 2023-10-04 – 2023-10-06 (×6): 25 mg via ORAL
  Filled 2023-10-04 (×6): qty 1

## 2023-10-04 MED ORDER — ASPIRIN 81 MG PO CHEW: 81.0000 mg | CHEWABLE_TABLET | ORAL | Status: DC

## 2023-10-04 NOTE — Progress Notes (Signed)
PHARMACY - ANTICOAGULATION CONSULT NOTE  Pharmacy Consult for heparin Indication: chest pain/ACS  Allergies  Allergen Reactions   Bee Pollen     Other reaction(s): Other (See Comments) Sinus drainage Other reaction(s): Other (See Comments) Other reaction(s): Other (See Comments) Sinus drainage Sinus drainage    Pollen Extract Other (See Comments)    Sinus drainage    Patient Measurements: Height: 6\' 1"  (185.4 cm) Weight: 113.4 kg (250 lb) IBW/kg (Calculated) : 79.9 Heparin Dosing Weight: 103   Vital Signs: Temp: 98.1 F (36.7 C) (11/17 0503) Temp Source: Oral (11/17 0503) BP: 128/64 (11/17 0503) Pulse Rate: 95 (11/17 0503)  Labs: Recent Labs    10/03/23 0840 10/03/23 1016 10/04/23 0441  HGB 16.3  --  13.2  HCT 44.7  --  36.6*  PLT 188  --  154  HEPARINUNFRC  --   --  <0.10*  CREATININE 0.92  --   --   TROPONINIHS 491* 290*  --     Estimated Creatinine Clearance: 135.2 mL/min (by C-G formula based on SCr of 0.92 mg/dL).   Medical History: Past Medical History:  Diagnosis Date   Allergies    Anxiety    Asthma    Depression    Essential hypertension    GERD (gastroesophageal reflux disease)    Hyperlipidemia    Type 2 diabetes mellitus (HCC)     Assessment: 83 YOM presenting with CP, he is not on anticoagulation PTA.  Pt s/p LHC with mvCAD. Pharmacy to resume heparin 2h after TR band removal (deflated ~1830).  11/17 AM update:  Heparin level undetectable Awaiting CVTS consult  Goal of Therapy:  Heparin level 0.3-0.7 units/ml Monitor platelets by anticoagulation protocol: Yes   Plan:  Inc heparin to 1600 units/hr 1400 heparin level  Abran Duke, PharmD, BCPS Clinical Pharmacist Phone: [phone]

## 2023-10-04 NOTE — Progress Notes (Signed)
Progress Note  Patient Name: Edwin Ford Date of Encounter: 10/04/2023 Primary Cardiologist: New to Dr. Izora Ribas  Subjective   Overnight chest pain has resolved, headache has started. Patient notes no CP, SOB, Palpitations.  Vital Signs    Vitals:   10/04/23 0039 10/04/23 0503 10/04/23 0758 10/04/23 1208  BP:  128/64 131/76 128/75  Pulse:  95 90   Resp:  16 16 16   Temp:  98.1 F (36.7 C) 98.2 F (36.8 C) 98 F (36.7 C)  TempSrc:  Oral Oral Oral  SpO2:  97% 99%   Weight: 113.4 kg     Height:        Intake/Output Summary (Last 24 hours) at 10/04/2023 1245 Last data filed at 10/04/2023 0600 Gross per 24 hour  Intake 1117.62 ml  Output 550 ml  Net 567.62 ml   Filed Weights   10/03/23 0932 10/03/23 1649 10/04/23 0039  Weight: 113.4 kg 113.4 kg 113.4 kg    Physical Exam   GEN: No acute distress.   Neck: No JVD Cardiac: RRR, no murmurs, rubs, or gallops. No R radial hematoma Respiratory: Clear to auscultation bilaterally. GI: Soft, nontender, non-distended  MS: No edema  Labs   Telemetry: SR    Chemistry Recent Labs  Lab 10/03/23 0840  NA 135  K 3.8  CL 100  CO2 25  GLUCOSE 231*  BUN 9  CREATININE 0.92  CALCIUM 9.4  PROT 7.5  ALBUMIN 3.9  AST 21  ALT 18  ALKPHOS 76  BILITOT 0.6  GFRNONAA >60  ANIONGAP 10     Hematology Recent Labs  Lab 10/03/23 0840 10/04/23 0441  WBC 7.3 11.1*  RBC 5.30 4.36  HGB 16.3 13.2  HCT 44.7 36.6*  MCV 84.3 83.9  MCH 30.8 30.3  MCHC 36.5* 36.1*  RDW 12.4 12.3  PLT 188 154    Cardiac EnzymesNo results for input(s): "TROPONINI" in the last 168 hours. No results for input(s): "TROPIPOC" in the last 168 hours.   BNPNo results for input(s): "BNP", "PROBNP" in the last 168 hours.   DDimer No results for input(s): "DDIMER" in the last 168 hours.   Cardiac Studies   Cardiac Studies & Procedures   CARDIAC CATHETERIZATION  CARDIAC CATHETERIZATION 10/03/2023  Narrative   LCA:   CULPRIT  LESION: Ramus lesion is 99% stenosed.   Prox LAD to Mid LAD lesion is 70% stenosed.   Small caliber 1st Diag lesion is 80% stenosed.  Small caliber 2nd Diag lesion is 60% stenosed.   Prox Cx lesion is 80% stenosed. Prox Cx to Mid Cx lesion is 70% stenosed.   Very small caliber 1st LPL lesion is 100% stenosed.   RCA:   RV Branch lesion is 85% stenosed.   Mid RCA lesion is 75% stenosed. Dist RCA-1 lesion is 55% stenosed.   Bifurcation Dist RCA<RPDA<RPA V: Dist RCA-2 lesion is 99% stenosed with 95% stenosed side branch in RPAV.  RPDA lesion is 75% stenosed.   Hemodynamics:   LV end diastolic pressure is moderately elevated.   There is no aortic valve stenosis.  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 Vessel is normal in caliber.  Left Anterior Descending Vessel is normal in caliber. The vessel exhibits minimal luminal irregularities. Prox LAD to Mid LAD lesion is 70% stenosed.  First Diagonal Branch Vessel is small in size. There is mild disease in the vessel. 1st Diag lesion is 80% stenosed.  Second Diagonal Branch 2nd Diag lesion is 60% stenosed.  Ramus Intermedius Vessel is small. Ramus lesion is 99% stenosed.  Left Circumflex Vessel is large. There is mild diffuse disease throughout the vessel. Prox Cx lesion is 80% stenosed. The lesion is focal, concentric  and irregular. Prox Cx to Mid Cx lesion is 70% stenosed.  First Obtuse Marginal Branch Vessel is small in size.  Second Obtuse Marginal Branch Vessel is moderate in size.  First Left Posterolateral Branch Vessel is small in size. Collaterals 1st LPL filled by collaterals from Dist Cx.  1st LPL lesion is 100% stenosed.  Left Posterior Atrioventricular Artery Vessel is small in size.  Right Coronary Artery Vessel was injected. Vessel is large. There is mild diffuse disease throughout the vessel. Mid RCA lesion is 75% stenosed. Dist RCA-1 lesion is 55% stenosed. Dist RCA-2 lesion is 99% stenosed with 95% stenosed side branch in RPAV.  Acute Marginal Branch Vessel is small in size.  Right Ventricular Branch Vessel is small in size. RV Branch lesion is 85% stenosed.  Right Posterior Descending Artery RPDA lesion is 75% stenosed.  First Right Posterolateral Branch Vessel is small in size.  Second Right Posterolateral Branch Vessel is moderate in size.  Intervention  No interventions have been documented.     ECHOCARDIOGRAM  ECHOCARDIOGRAM COMPLETE 10/03/2023  Narrative ECHOCARDIOGRAM REPORT    Patient Name:   Edwin Ford Date of Exam: 10/03/2023 Medical Rec #:  [id number]      Height:       73.0 in Accession #:    [id number]     Weight:       250.0 lb Date of Birth:  [birth year]      BSA:          2.366 m Patient Age:    42 years       BP:           134/74 mmHg Patient Gender: M              HR:           70 bpm. Exam Location:  Inpatient  Procedure: 2D Echo, Color Doppler, Cardiac Doppler and Strain Analysis  STAT ECHO  Indications:    NSTEMI  History:        Patient has no prior history of Echocardiogram examinations. Risk Factors:Current Smoker, Diabetes, Hypertension and Dyslipidemia.  Sonographer:    Delcie Roch RDCS Referring Phys: [id number] Lazette Estala A Elonna Mcfarlane  IMPRESSIONS   1. Abnormal anterior and anterolateral regional strain.  Left ventricular ejection fraction, by estimation, is 50 to 55%. The left ventricle has low normal function. The left ventricle has no regional wall motion abnormalities. There is mild asymmetric left ventricular hypertrophy of the inferior segment. Left ventricular diastolic parameters are consistent with Grade II diastolic dysfunction (pseudonormalization). Anterior and lateral hypokinesis . The average left ventricular global longitudinal strain is -13.6 %. The global longitudinal strain is abnormal. 2. Right ventricular systolic function is normal. The right ventricular size is moderately enlarged. 3. The mitral  valve is normal in structure. Trivial mitral valve regurgitation. No evidence of mitral stenosis. 4. The aortic valve is tricuspid. Aortic valve regurgitation is not visualized. No aortic stenosis is present. 5. The inferior vena cava is normal in size with greater than 50% respiratory variability, suggesting right atrial pressure of 3 mmHg.  Comparison(s): No prior Echocardiogram.  FINDINGS Left Ventricle: Abnormal anterior and anterolateral regional strain. Left ventricular ejection fraction, by estimation, is 50 to 55%. The left ventricle has low normal function. The left ventricle has no regional wall motion abnormalities. The average left ventricular global longitudinal strain is -13.6 %. The global longitudinal strain is abnormal. The left ventricular internal cavity size was normal in size. There is mild asymmetric left ventricular hypertrophy of the inferior segment. Left ventricular diastolic parameters are consistent with Grade II diastolic dysfunction (pseudonormalization).   LV Wall Scoring: The entire anterior wall, antero-lateral wall, and apical lateral segment are hypokinetic. The entire septum, entire inferior wall, and posterior wall are normal. Anterior and lateral hypokinesis.  Right Ventricle: The right ventricular size is moderately enlarged. No increase in  right ventricular wall thickness. Right ventricular systolic function is normal.  Left Atrium: Left atrial size was normal in size.  Right Atrium: Right atrial size was normal in size.  Pericardium: There is no evidence of pericardial effusion.  Mitral Valve: The mitral valve is normal in structure. Trivial mitral valve regurgitation. No evidence of mitral valve stenosis.  Tricuspid Valve: The tricuspid valve is normal in structure. Tricuspid valve regurgitation is trivial. No evidence of tricuspid stenosis.  Aortic Valve: The aortic valve is tricuspid. Aortic valve regurgitation is not visualized. No aortic stenosis is present.  Pulmonic Valve: The pulmonic valve was normal in structure. Pulmonic valve regurgitation is not visualized. No evidence of pulmonic stenosis.  Aorta: The aortic root and ascending aorta are structurally normal, with no evidence of dilitation.  Venous: The inferior vena cava is normal in size with greater than 50% respiratory variability, suggesting right atrial pressure of 3 mmHg.  IAS/Shunts: The atrial septum is grossly normal.   LEFT VENTRICLE PLAX 2D LVIDd:         4.90 cm   Diastology LVIDs:         2.90 cm   LV e' medial:    6.20 cm/s LV PW:         1.30 cm   LV E/e' medial:  15.6 LV IVS:        1.10 cm   LV e' lateral:   6.42 cm/s LVOT diam:     2.00 cm   LV E/e' lateral: 15.1 LV SV:         74 LV SV Index:   31        2D Longitudinal Strain LVOT Area:     3.14 cm  2D Strain GLS Avg:     -13.6 %   RIGHT VENTRICLE             IVC RV Basal diam:  2.50 cm     IVC diam: 2.10 cm RV S prime:     12.30 cm/s TAPSE (M-mode): 2.2 cm  LEFT ATRIUM             Index        RIGHT ATRIUM           Index LA diam:        3.50 cm 1.48 cm/m   RA Area:     13.80 cm LA Vol (  A2C):   48.6 ml 20.54 ml/m  RA Volume:   33.10 ml  13.99 ml/m LA Vol (A4C):   51.1 ml 21.60 ml/m LA Biplane Vol: 51.1 ml 21.60 ml/m AORTIC VALVE LVOT Vmax:   120.00 cm/s LVOT  Vmean:  74.400 cm/s LVOT VTI:    0.237 m  AORTA Ao Root diam: 3.40 cm Ao Asc diam:  3.60 cm  MITRAL VALVE MV Area (PHT): 3.60 cm    SHUNTS MV Decel Time: 211 msec    Systemic VTI:  0.24 m MV E velocity: 96.90 cm/s  Systemic Diam: 2.00 cm MV A velocity: 91.20 cm/s MV E/A ratio:  1.06  Riley Lam MD Electronically signed by Riley Lam MD Signature Date/Time: 10/03/2023/1:15:03 PM    Final             Assessment & Plan   Non-ST Elevation Myocardial Infarction (NSTEMI) - multi-vessel disease, discussed in brief with TCTS who will see -low dose BB started today - goal is to wean nitroglycerin drip due to headache - continue heparin - no plavix given through couse   Hypertension - BP stable on drip   Hyperlipidemia - Recent LDL level at 260 mg/dL. Family history of premature coronary artery disease. - atorvastatin 80 mg - starting zetia 10 mg as well   Type 2 Diabetes Mellitus - Blood sugar level at 231 mg/dL. Requires glucose management support. - SSI; Consult glucose management team for blood sugar control   Obesity Significant obesity with distant heart sounds on exam. History of weight loss (30 pounds). - with NSTEMI he is an outpatient consideration for GLP-RA therapy   Smoking Cessation Patient smokes daily. Requires smoking cessation support. - on nicotine replacement patch   Full Code Heparin -> lovenox PPX after cath Cardiac Diet  Social work consult is pending- he is homeless Sister at bedside   For questions or updates, please contact [facility] Please consult [email or web address] for contact info under Cardiology/STEMI.      Riley Lam, MD FASE [facility] Cardiologist [facility]  [address] [place], [address] [place], [place] [address] [phone]  12:45 PM

## 2023-10-04 NOTE — Progress Notes (Signed)
PHARMACY - ANTICOAGULATION CONSULT NOTE  Pharmacy Consult for heparin Indication: chest pain/ACS  Allergies  Allergen Reactions   Bee Pollen     Other reaction(s): Other (See Comments) Sinus drainage Other reaction(s): Other (See Comments) Other reaction(s): Other (See Comments) Sinus drainage Sinus drainage    Pollen Extract Other (See Comments)    Sinus drainage    Patient Measurements: Height: 6\' 1"  (185.4 cm) Weight: 113.4 kg (250 lb) IBW/kg (Calculated) : 79.9 Heparin Dosing Weight: 103   Vital Signs: Temp: 98 F (36.7 C) (11/17 1208) Temp Source: Oral (11/17 1208) BP: 128/75 (11/17 1208) Pulse Rate: 90 (11/17 0758)  Labs: Recent Labs    10/03/23 0840 10/03/23 1016 10/04/23 0441 10/04/23 1351  HGB 16.3  --  13.2  --   HCT 44.7  --  36.6*  --   PLT 188  --  154  --   HEPARINUNFRC  --   --  <0.10* <0.10*  CREATININE 0.92  --   --   --   TROPONINIHS 491* 290*  --   --     Estimated Creatinine Clearance: 135.2 mL/min (by C-G formula based on SCr of 0.92 mg/dL).   Medical History: Past Medical History:  Diagnosis Date   Allergies    Anxiety    Asthma    Depression    Essential hypertension    GERD (gastroesophageal reflux disease)    Hyperlipidemia    Type 2 diabetes mellitus (HCC)     Assessment: 66 YOM presenting with CP, he is not on anticoagulation PTA. Patient with multivessel disease, CVTS to assess.   Heparin level still undetectable on 1600 units/hr. Hgb: 13.2, plt wnl. No pauses or issues with the infusion or signs of bleeding per RN.    Goal of Therapy:  Heparin level 0.3-0.7 units/ml Monitor platelets by anticoagulation protocol: Yes   Plan:  Heparin 3000 units x 1 bolus  Increase heparin to 1800 units/hr  Heparin level in 6 hours and daily while on heparin  Monitor CBC and for s/sx of bleeding   Thank you for involving pharmacy in the patient's care.   Theotis Burrow, PharmD PGY1 Acute Care Pharmacy Resident   10/04/2023 2:54 PM

## 2023-10-05 ENCOUNTER — Encounter (HOSPITAL_COMMUNITY): Payer: Self-pay | Admitting: Cardiology

## 2023-10-05 DIAGNOSIS — I214 Non-ST elevation (NSTEMI) myocardial infarction: Secondary | ICD-10-CM | POA: Diagnosis not present

## 2023-10-05 DIAGNOSIS — I2510 Atherosclerotic heart disease of native coronary artery without angina pectoris: Secondary | ICD-10-CM | POA: Diagnosis not present

## 2023-10-05 LAB — CBC
HCT: 36.5 % — ABNORMAL LOW (ref 39.0–52.0)
Hemoglobin: 13.2 g/dL (ref 13.0–17.0)
MCH: 31 pg (ref 26.0–34.0)
MCHC: 36.2 g/dL — ABNORMAL HIGH (ref 30.0–36.0)
MCV: 85.7 fL (ref 80.0–100.0)
Platelets: 146 10*3/uL — ABNORMAL LOW (ref 150–400)
RBC: 4.26 MIL/uL (ref 4.22–5.81)
RDW: 12.4 % (ref 11.5–15.5)
WBC: 9.2 10*3/uL (ref 4.0–10.5)
nRBC: 0 % (ref 0.0–0.2)

## 2023-10-05 LAB — GLUCOSE, CAPILLARY
Glucose-Capillary: 156 mg/dL — ABNORMAL HIGH (ref 70–99)
Glucose-Capillary: 136 mg/dL — ABNORMAL HIGH (ref 70–99)
Glucose-Capillary: 128 mg/dL — ABNORMAL HIGH (ref 70–99)
Glucose-Capillary: 101 mg/dL — ABNORMAL HIGH (ref 70–99)
Glucose-Capillary: 182 mg/dL — ABNORMAL HIGH (ref 70–99)

## 2023-10-05 LAB — BASIC METABOLIC PANEL
Anion gap: 10 (ref 5–15)
BUN: 10 mg/dL (ref 6–20)
CO2: 21 mmol/L — ABNORMAL LOW (ref 22–32)
Calcium: 8.6 mg/dL — ABNORMAL LOW (ref 8.9–10.3)
Chloride: 103 mmol/L (ref 98–111)
Creatinine, Ser: 1.03 mg/dL (ref 0.61–1.24)
GFR, Estimated: >60 mL/min (ref >60)
Glucose, Bld: 156 mg/dL — ABNORMAL HIGH (ref 70–99)
Potassium: 3.6 mmol/L (ref 3.5–5.1)
Sodium: 134 mmol/L — ABNORMAL LOW (ref 135–145)

## 2023-10-05 LAB — LIPID PANEL
Cholesterol: 150 mg/dL (ref 0–200)
HDL: 37 mg/dL — ABNORMAL LOW (ref >40)
LDL Cholesterol: 81 mg/dL (ref 0–99)
Total CHOL/HDL Ratio: 4.1 {ratio}
Triglycerides: 161 mg/dL — ABNORMAL HIGH (ref <150)
VLDL: 32 mg/dL (ref 0–40)

## 2023-10-05 LAB — HEPARIN LEVEL (UNFRACTIONATED)
Heparin Unfractionated: <0.1 [IU]/mL — ABNORMAL LOW (ref 0.30–0.70)
Heparin Unfractionated: 0.14 [IU]/mL — ABNORMAL LOW (ref 0.30–0.70)

## 2023-10-05 MED ORDER — ASPIRIN 81 MG PO TBEC
81.0000 mg | DELAYED_RELEASE_TABLET | Freq: Every day | ORAL | Status: DC
  Administered 2023-10-05 – 2023-10-06 (×2): 81 mg via ORAL
  Filled 2023-10-05 (×2): qty 1

## 2023-10-05 MED ORDER — LIVING WELL WITH DIABETES BOOK
Freq: Once | Status: AC
  Filled 2023-10-05: qty 1

## 2023-10-05 MED ORDER — HEPARIN BOLUS VIA INFUSION
3000.0000 [IU] | Freq: Once | INTRAVENOUS | Status: AC
Start: 2023-10-05 — End: 2023-10-05
  Administered 2023-10-05: 3000 [IU] via INTRAVENOUS
  Filled 2023-10-05: qty 3000

## 2023-10-05 MED ORDER — HEPARIN BOLUS VIA INFUSION
3000.0000 [IU] | Freq: Once | INTRAVENOUS | Status: AC
  Administered 2023-10-05: 3000 [IU] via INTRAVENOUS
  Filled 2023-10-05: qty 3000

## 2023-10-05 NOTE — Progress Notes (Signed)
PHARMACY - ANTICOAGULATION CONSULT NOTE  Pharmacy Consult for heparin Indication: chest pain/ACS  Allergies  Allergen Reactions   Bee Pollen     Other reaction(s): Other (See Comments) Sinus drainage Other reaction(s): Other (See Comments) Other reaction(s): Other (See Comments) Sinus drainage Sinus drainage    Pollen Extract Other (See Comments)    Sinus drainage    Patient Measurements: Height: 6\' 1"  (185.4 cm) Weight: 113.4 kg (250 lb) IBW/kg (Calculated) : 79.9 Heparin Dosing Weight: 103 kg   Vital Signs: Temp: 98.8 F (37.1 C) (11/18 1156) Temp Source: Oral (11/18 1156) BP: 145/90 (11/18 1156) Pulse Rate: 83 (11/18 1156)  Labs: Recent Labs    10/03/23 0840 10/03/23 0840 10/03/23 1016 10/04/23 0441 10/04/23 1351 10/05/23 0727 10/05/23 1636  HGB 16.3  --   --  13.2  --  13.2  --   HCT 44.7  --   --  36.6*  --  36.5*  --   PLT 188  --   --  154  --  146*  --   HEPARINUNFRC  --    < >  --  <0.10* <0.10* <0.10* 0.14*  CREATININE 0.92  --   --   --   --  1.03  --   TROPONINIHS 491*  --  290*  --   --   --   --    < > = values in this interval not displayed.    Estimated Creatinine Clearance: 120.8 mL/min (by C-G formula based on SCr of 1.03 mg/dL).   Assessment: 30 YOM presenting with CP, he is not on anticoagulation PTA. Patient with multivessel disease, CVTS to assess.   Heparin level (0.14) subtherapeutic on 1800 units/hr. No pauses or issues with the infusion or signs of bleeding per RN.   Goal of Therapy:  Heparin level 0.3-0.7 units/ml Monitor platelets by anticoagulation protocol: Yes   Plan:  Heparin 3000 units x 1 bolus  Increase heparin to 2300 units/hr  Heparin level in 6 hours   Thank you for involving pharmacy in the patient's care.   Christoper Fabian, PharmD, BCPS Please see amion for complete clinical pharmacist phone list 10/05/2023  6:18 PM

## 2023-10-05 NOTE — Progress Notes (Addendum)
   Patient Name: Edwin Ford Date of Encounter: 10/05/2023 [facility] Cardiologist: None   Interval Summary  .    No chest pain. Very brief conversation, mostly looking at his phone.   Vital Signs .    Vitals:   10/04/23 0758 10/04/23 1208 10/04/23 2033 10/05/23 0451  BP: 131/76 128/75 134/72 122/69  Pulse: 90  93 90  Resp: 16 16 18 16   Temp: 98.2 F (36.8 C) 98 F (36.7 C) 99.5 F (37.5 C) 99.5 F (37.5 C)  TempSrc: Oral Oral Oral Oral  SpO2: 99%  99%   Weight:      Height:        Intake/Output Summary (Last 24 hours) at 10/05/2023 0930 Last data filed at 10/05/2023 0859 Gross per 24 hour  Intake 1224.68 ml  Output --  Net 1224.68 ml      10/04/2023   12:39 AM 10/03/2023    4:49 PM 10/03/2023    9:32 AM  Last 3 Weights  Weight (lbs) 250 lb 250 lb 250 lb  Weight (kg) 113.4 kg 113.4 kg 113.399 kg      Telemetry/ECG    Sinus Rhythm, 80s - Personally Reviewed  Physical Exam .   GEN: No acute distress.   Neck: No JVD Cardiac: RRR, no murmurs, rubs, or gallops.  Respiratory: Clear to auscultation bilaterally. GI: Soft, nontender, non-distended  MS: No edema Skin: right radial cath site stable  Assessment & Plan .     42 y.o. male   with a history of hypertension, hyperlipidemia, type 2 diabetes, and significant obesity, presented with a new onset NSTEMI.   Non-STEMI -- High-sensitivity troponin peaked at 491.  Underwent cardiac catheterization 11/16 with severe multivessel CAD.  Pending TCTS evaluation.  Echo showed LVEF of 50 to 55%, no regional wall motion abnormalities, mild asymmetric LVH in the inferior segment, grade 2 diastolic dysfunction, anterior and lateral hypokinesis. -- Continue IV heparin, aspirin, metoprolol tartrate 25 mg twice daily, atorvastatin 80 mg daily, Zetia 10 mg daily  Hypertension -- Controlled -- Continue IV NTG, metoprolol tartrate 25 mg twice daily  Hyperlipidemia -- Lipid panel pending, LP(a) in  process -- Continue atorvastatin 80 mg daily, Zetia 10 mg daily  Diabetes -- Hgb A1c 10.8 -- Metformin on hold, SSI while inpatient -- Consider addition of SGLT2 prior to discharge  Tobacco use -- Cessation advised  Obesity -- Lifestyle modification  Social issues -- he is currently homeless, not on any medications prior to admission. Living in motels  -- social work consult  For questions or updates, please contact [facility] Please consult [email or web address] for contact info under        Signed, Laverda Page, NP   I have personally seen and examined this patient. I agree with the assessment and plan as outlined above.  He was admitted over the weekend with chest pain and ruled in for MI with mild elevation of troponin. Cardiac cath with severe multi-vessel CAD. Echo with preserved LV systolic function.  CT surgery consult pending to review options for CABG He had mild chest pain this am. He remains on IV NTG and IV heparin.  Will continue current medications today.   Verne Carrow, MD, [facility] 10/05/2023 10:18 AM

## 2023-10-05 NOTE — Consult Note (Cosign Needed)
[address]       [place] [address]             [phone]        EROL ODAM [facility] Medical Record #[id number] Date of Birth: February 10, 1981  Referring: Herbie Baltimore Primary Care: Bess Kinds, MD Primary Cardiologist:None  Chief Complaint:    Chief Complaint  Patient presents with   Chest Pain   History of Present Illness:      Kavin Weisenberg is a 42 yo obese male with history of HTN, DM, polysubstance abuse (cocaine, marijuana), nicotine abuse, and depression.  The patient presented to the ED at [facility] on 11/16 with complaints of chest pain for 30 minutes duration.  This was accompanied by diaphoresis, but he denied shortness of breath.  The pain also radiated down both arms.  He has experienced similar episodes in the past but did not seek evaluation.  EKG obtained showed no ischemic changes, but there was NSR with RBBB, ST depressions in the inferior leads, T wave and ST depressions.  These were new changes from previous EKG.  He was treated with ASA and SL NTG.  Troponin level came back elevated and he was transported to [facility].  Upon arrival he was ruled in for NSTEMI and admitted for further care.  He was taken to the catheterization lab on 11/16 and found to have multivessel CAD.  It was felt coronary bypass grafting would be indicated and TCTS consult was requested.  The patient is currently homeless, but lives in a hotel.  He did not have a toxicology screen on admission  The patient previously used cocaine off and on he hasn't used in several months.  He is a current smoker of 1.5-2 ppd and has been smoking about 28 years.  He denies active chest pain.  He is pretty sure he has family history of CAD.  He is active performs physical labor.  He states that he would notice "heart burn" after he would get off work, however he now know this isn't burn.  He was not taking medication for DM prior to admission, due to running out and he  can't afford it.  He states he won't buy it, due to cost.  He was staying at [facility] prior to admission.  He is agreeable to have surgery, but is scared.  I explained to the patient the importance of medication compliance and good DM control to get the best outcome of his surgery..       Current Activity/ Functional Status: Patient is independent with mobility/ambulation, transfers, ADL's, IADL's.   Zubrod Score: At the time of surgery this patient's most appropriate activity status/level should be described as: []     0    Normal activity, no symptoms []     1    Restricted in physical strenuous activity but ambulatory, able to do out light work []     2    Ambulatory and capable of self care, unable to do work activities, up and about                 more than 50%  Of the time                            []     3    Only limited self care, in bed greater than 50% of waking hours []   4    Completely disabled, no self care, confined to bed or chair []     5    Moribund  Past Medical History:  Diagnosis Date   Allergies    Anxiety    Asthma    Depression    Essential hypertension    GERD (gastroesophageal reflux disease)    Hyperlipidemia    Type 2 diabetes mellitus (HCC)     Past Surgical History:  Procedure Laterality Date   LEFT HEART CATH AND CORONARY ANGIOGRAPHY N/A 10/03/2023   Procedure: LEFT HEART CATH AND CORONARY ANGIOGRAPHY;  Surgeon: Marykay Lex, MD;  Location: [facility] INVASIVE CV LAB;  Service: Cardiovascular;  Laterality: N/A;   TOOTH EXTRACTION N/A     Social History   Tobacco Use  Smoking Status Every Day   Current packs/day: 1.00   Average packs/day: 1 pack/day for 28.9 years (28.9 ttl pk-yrs)   Types: Cigarettes   Start date: 11/17/1994  Smokeless Tobacco Former   Types: Chew    Social History   Substance and Sexual Activity  Alcohol Use No     Allergies  Allergen Reactions   Bee Pollen     Other reaction(s): Other (See Comments) Sinus drainage Other  reaction(s): Other (See Comments) Other reaction(s): Other (See Comments) Sinus drainage Sinus drainage    Pollen Extract Other (See Comments)    Sinus drainage    Current Facility-Administered Medications  Medication Dose Route Frequency Provider Last Rate Last Admin   acetaminophen (TYLENOL) tablet 650 mg  650 mg Oral Q4H PRN Marykay Lex, MD   650 mg at 10/05/23 0647   albuterol (PROVENTIL) (2.5 MG/3ML) 0.083% nebulizer solution 2.5 mg  2.5 mg Nebulization Q6H PRN Marykay Lex, MD       ALPRAZolam Prudy Feeler) tablet 1 mg  1 mg Oral BID PRN Marykay Lex, MD   1 mg at 10/05/23 0648   alum & mag hydroxide-simeth (MAALOX/MYLANTA) 200-200-20 MG/5ML suspension 30 mL  30 mL Oral Q4H PRN Marykay Lex, MD   30 mL at 10/03/23 2100   aspirin EC tablet 81 mg  81 mg Oral Daily Laverda Page B, NP   81 mg at 10/05/23 0959   atorvastatin (LIPITOR) tablet 80 mg  80 mg Oral Daily Marykay Lex, MD   80 mg at 10/05/23 0858   ezetimibe (ZETIA) tablet 10 mg  10 mg Oral Daily Chandrasekhar, Mahesh A, MD   10 mg at 10/05/23 0858   heparin ADULT infusion 100 units/mL (25000 units/226mL)  2,000 Units/hr Intravenous Continuous Laverda Page B, NP 20 mL/hr at 10/05/23 0959 2,000 Units/hr at 10/05/23 0959   insulin aspart (novoLOG) injection 0-20 Units  0-20 Units Subcutaneous TID WC Marykay Lex, MD   3 Units at 10/05/23 7829   insulin aspart (novoLOG) injection 0-5 Units  0-5 Units Subcutaneous QHS Marykay Lex, MD       insulin aspart (novoLOG) injection 6 Units  6 Units Subcutaneous TID WC Marykay Lex, MD   6 Units at 10/05/23 0859   metoprolol tartrate (LOPRESSOR) tablet 25 mg  25 mg Oral BID Chandrasekhar, Mahesh A, MD   25 mg at 10/05/23 0858   morphine (PF) 2 MG/ML injection 2 mg  2 mg Intravenous Q2H PRN Marykay Lex, MD       nicotine (NICODERM CQ - dosed in mg/24 hours) patch 14 mg  14 mg Transdermal Q0600 Marykay Lex, MD   14 mg at 10/05/23 [phone]  nitroGLYCERIN  (NITROSTAT) SL tablet 0.4 mg  0.4 mg Sublingual Q5 min PRN Marykay Lex, MD   0.4 mg at 10/03/23 6045   nitroGLYCERIN 50 mg in dextrose 5 % 250 mL (0.2 mg/mL) infusion  0-200 mcg/min Intravenous Continuous Marykay Lex, MD 9 mL/hr at 10/05/23 0959 30 mcg/min at 10/05/23 0959   ondansetron (ZOFRAN) injection 4 mg  4 mg Intravenous Q6H PRN Marykay Lex, MD       sodium chloride flush (NS) 0.9 % injection 3 mL  3 mL Intravenous Q12H Marykay Lex, MD   3 mL at 10/05/23 4098    Medications Prior to Admission  Medication Sig Dispense Refill Last Dose   albuterol (PROVENTIL HFA) 108 (90 Base) MCG/ACT inhaler INHALE 2 PUFFS BY MOUTH EVERY 6 HOURS AS NEEDED FOR COUGHING, WHEEZING, OR SHORTNESS OF BREATH (Patient taking differently: Inhale 2 puffs into the lungs every 6 (six) hours as needed for shortness of breath. INHALE 2 PUFFS BY MOUTH EVERY 6 HOURS AS NEEDED FOR COUGHING, WHEEZING, OR SHORTNESS OF BREATH) 1 each 0 Past Month   alprazolam (XANAX) 2 MG tablet Take 2 mg by mouth 3 (three) times daily as needed for sleep.   10/02/2023   metFORMIN (GLUCOPHAGE-XR) 500 MG 24 hr tablet STARTING 6/5 - 1000 MG AM AND 500 MG PM STARTING 6/12 - 1000 MG AM AND 1000 MG PM (Patient taking differently: Take 1,000 mg by mouth 2 (two) times daily with a meal. Starting 6/5 - 1000 mg am and 500 mg pm Starting 6/12 - 1000 mg am and 1000 mg pm) 90 tablet 1 10/02/2023   amLODipine (NORVASC) 10 MG tablet TAKE 1 Tablet BY MOUTH ONCE EVERY DAY (Patient not taking: Reported on 10/03/2023) 90 tablet 0 Not Taking   atorvastatin (LIPITOR) 20 MG tablet TAKE 1 Tablet BY MOUTH ONCE EVERY DAY (Patient not taking: Reported on 10/03/2023) 90 tablet 0 Not Taking   Blood Pressure Monitoring (BLOOD PRESSURE CUFF) MISC Take BP 3-4 times weekly. (Patient not taking: Reported on 10/03/2023) 1 each 0 Not Taking   insulin glargine (LANTUS SOLOSTAR) 100 UNIT/ML Solostar Pen Inject 30 Units into the skin daily. (Patient not taking:  Reported on 10/03/2023) 15 mL 0 Not Taking   lisinopril (ZESTRIL) 40 MG tablet TAKE 1 Tablet BY MOUTH ONCE EVERY DAY (Patient not taking: Reported on 10/03/2023) 90 tablet 0 Not Taking   metoprolol tartrate (LOPRESSOR) 100 MG tablet Take 1 tablet (100 mg total) by mouth 2 (two) times daily. (Patient not taking: Reported on 10/03/2023) 60 tablet 3 Not Taking   omeprazole (PRILOSEC) 20 MG capsule Take 1 capsule (20 mg total) by mouth daily. (Patient not taking: Reported on 10/03/2023) 60 capsule 2 Not Taking   sertraline (ZOLOFT) 100 MG tablet Take 2 tablets (200 mg total) by mouth daily. (Patient not taking: Reported on 10/03/2023) 60 tablet 0 Not Taking   sitaGLIPtin (JANUVIA) 100 MG tablet TAKE 1 Tablet BY MOUTH ONCE EVERY DAY (Patient not taking: Reported on 10/03/2023) 30 tablet 0 Not Taking   traZODone (DESYREL) 100 MG tablet Take 50-200 mg by mouth at bedtime. (Patient not taking: Reported on 10/03/2023)   Not Taking    Family History  Problem Relation Age of Onset   CAD Mother        CABG at age 97   Hypertension Mother    Diabetes Mellitus II Mother    High Cholesterol Mother    Kidney disease Father    Diabetes Mellitus  II Father    Depression Father    High Cholesterol Father    Hypertension Father      Review of Systems:   ROS    Cardiac Review of Systems: Y or  [    ]= no  Chest Pain [  Y  ]  Resting SOB [  N ] Exertional SOB  [  ]  Orthopnea [  ]   Pedal Edema [ N  ]    Palpitations Klaus.Mock  ] Syncope  [  ]   Presyncope [   ]  General Review of Systems: [Y] = yes [  ]=no Constitional: recent weight change [  ]; anorexia [  ]; fatigue [  ]; nausea [  ]; night sweats [  ]; fever [  ]; or chills [  ]                                                               Dental: Last Dentist visit:   Eye : blurred vision [  ]; diplopia [   ]; vision changes [  ];  Amaurosis fugax[  ]; Resp: cough [ N ];  wheezing[  ];  hemoptysis[  ]; shortness of breath[ N ]; paroxysmal nocturnal  dyspnea[  ]; dyspnea on exertion[  ]; or orthopnea[  ];  GI:  gallstones[  ], vomiting[ N ];  dysphagia[  ]; melena[  ];  hematochezia [  ]; heartburn[  ];   Hx of  Colonoscopy[  ]; GU: kidney stones [  ]; hematuria[  ];   dysuria [  ];  nocturia[  ];  history of     obstruction [  ]; urinary frequency [  ]             Skin: rash, swelling[ N ];, hair loss[  ];  peripheral edema[ N ];  or itching[  ]; Musculosketetal: myalgias[  ];  joint swelling[  ];  joint erythema[  ];  joint pain[  ];  back pain[  ];  Heme/Lymph: bruising[  ];  bleeding[  ];  anemia[  ];  Neuro: TIA[  ];  headaches[  ];  stroke[ N ];  vertigo[  ];  seizures[  ];   paresthesias[  ];  difficulty walking[N  ];  Psych:depression[  ]; anxiety[  ];  Endocrine: diabetes[ Y ];  thyroid dysfunction[  ];  Physical Exam: BP 122/69 (BP Location: Right Arm)   Pulse 90   Temp 99.5 F (37.5 C) (Oral)   Resp 16   Ht 6\' 1"  (1.854 m)   Wt 113.4 kg   SpO2 99%   BMI 32.98 kg/m   General appearance: alert, cooperative, and no distress Head: Normocephalic, without obvious abnormality, atraumatic Neck: no adenopathy, no carotid bruit, no JVD, supple, symmetrical, trachea midline, and thyroid not enlarged, symmetric, no tenderness/mass/nodules Resp: clear to auscultation bilaterally Cardio: regular rate and rhythm GI: soft, non-tender; bowel sounds normal; no masses,  no organomegaly Extremities: extremities normal, atraumatic, no cyanosis or edema Neurologic: Grossly normal  Diagnostic Studies & Laboratory data:     Recent Radiology Findings:   CARDIAC CATHETERIZATION  Result Date: 10/03/2023   LCA:   CULPRIT LESION: Ramus lesion is 99% stenosed.   Prox LAD to Mid LAD  lesion is 70% stenosed.   Small caliber 1st Diag lesion is 80% stenosed.  Small caliber 2nd Diag lesion is 60% stenosed.   Prox Cx lesion is 80% stenosed. Prox Cx to Mid Cx lesion is 70% stenosed.   Very small caliber 1st LPL lesion is 100% stenosed.   RCA:   RV  Branch lesion is 85% stenosed.   Mid RCA lesion is 75% stenosed. Dist RCA-1 lesion is 55% stenosed.   Bifurcation Dist RCA<RPDA<RPA V: Dist RCA-2 lesion is 99% stenosed with 95% stenosed side branch in RPAV.  RPDA lesion is 75% stenosed.   Hemodynamics:   LV end diastolic pressure is moderately elevated.   There is no aortic valve stenosis. POST-CATH DIAGNOSES Severe multivessel CAD involving 75% mid RCA and 99% RCA-PDA-PLV bifurcation, tandem lesions in the proximal to mid LCx 80% and 70%-with occluded SVG LCx-LPL 1, 99% proximal RI (culprit), 70% proximal LAD (eccentric ulcerated) Moderately elevated LVEDP-with low normal EF on Echo of 50 to 55% (anterolateral hypokinesis.)  RECOMMENDATIONS Patient will need CVTS consultation Will restart IV heparin 2 hours after TR band removal Continue IV nitroglycerin Continue aggressive RF management with high-dose statin, glycemic control, and beta-blocker/ARB    ECHOCARDIOGRAM COMPLETE  Result Date: 10/03/2023    ECHOCARDIOGRAM REPORT   Patient Name:   HARTFORD SKYBERG Date of Exam: 10/03/2023 Medical Rec #:  [id number]      Height:       73.0 in Accession #:    [id number]     Weight:       250.0 lb Date of Birth:  [birth year]      BSA:          2.366 m Patient Age:    42 years       BP:           134/74 mmHg Patient Gender: M              HR:           70 bpm. Exam Location:  Inpatient Procedure: 2D Echo, Color Doppler, Cardiac Doppler and Strain Analysis STAT ECHO Indications:    NSTEMI  History:        Patient has no prior history of Echocardiogram examinations.                 Risk Factors:Current Smoker, Diabetes, Hypertension and                 Dyslipidemia.  Sonographer:    Delcie Roch RDCS Referring Phys: [id number] MAHESH A CHANDRASEKHAR IMPRESSIONS  1. Abnormal anterior and anterolateral regional strain. Left ventricular ejection fraction, by estimation, is 50 to 55%. The left ventricle has low normal function. The left ventricle has no regional wall motion  abnormalities. There is mild asymmetric left ventricular hypertrophy of the inferior segment. Left ventricular diastolic parameters are consistent with Grade II diastolic dysfunction (pseudonormalization). Anterior and lateral hypokinesis . The average left ventricular global longitudinal strain is -13.6 %. The global longitudinal strain is abnormal.  2. Right ventricular systolic function is normal. The right ventricular size is moderately enlarged.  3. The mitral valve is normal in structure. Trivial mitral valve regurgitation. No evidence of mitral stenosis.  4. The aortic valve is tricuspid. Aortic valve regurgitation is not visualized. No aortic stenosis is present.  5. The inferior vena cava is normal in size with greater than 50% respiratory variability, suggesting right atrial pressure of 3 mmHg. Comparison(s): No prior Echocardiogram. FINDINGS  Left Ventricle: Abnormal  anterior and anterolateral regional strain. Left ventricular ejection fraction, by estimation, is 50 to 55%. The left ventricle has low normal function. The left ventricle has no regional wall motion abnormalities. The average left ventricular global longitudinal strain is -13.6 %. The global longitudinal strain is abnormal. The left ventricular internal cavity size was normal in size. There is mild asymmetric left ventricular hypertrophy of the inferior segment. Left ventricular diastolic parameters are consistent with Grade II diastolic dysfunction (pseudonormalization).  LV Wall Scoring: The entire anterior wall, antero-lateral wall, and apical lateral segment are hypokinetic. The entire septum, entire inferior wall, and posterior wall are normal. Anterior and lateral hypokinesis. Right Ventricle: The right ventricular size is moderately enlarged. No increase in right ventricular wall thickness. Right ventricular systolic function is normal. Left Atrium: Left atrial size was normal in size. Right Atrium: Right atrial size was normal in  size. Pericardium: There is no evidence of pericardial effusion. Mitral Valve: The mitral valve is normal in structure. Trivial mitral valve regurgitation. No evidence of mitral valve stenosis. Tricuspid Valve: The tricuspid valve is normal in structure. Tricuspid valve regurgitation is trivial. No evidence of tricuspid stenosis. Aortic Valve: The aortic valve is tricuspid. Aortic valve regurgitation is not visualized. No aortic stenosis is present. Pulmonic Valve: The pulmonic valve was normal in structure. Pulmonic valve regurgitation is not visualized. No evidence of pulmonic stenosis. Aorta: The aortic root and ascending aorta are structurally normal, with no evidence of dilitation. Venous: The inferior vena cava is normal in size with greater than 50% respiratory variability, suggesting right atrial pressure of 3 mmHg. IAS/Shunts: The atrial septum is grossly normal.  LEFT VENTRICLE PLAX 2D LVIDd:         4.90 cm   Diastology LVIDs:         2.90 cm   LV e' medial:    6.20 cm/s LV PW:         1.30 cm   LV E/e' medial:  15.6 LV IVS:        1.10 cm   LV e' lateral:   6.42 cm/s LVOT diam:     2.00 cm   LV E/e' lateral: 15.1 LV SV:         74 LV SV Index:   31        2D Longitudinal Strain LVOT Area:     3.14 cm  2D Strain GLS Avg:     -13.6 %  RIGHT VENTRICLE             IVC RV Basal diam:  2.50 cm     IVC diam: 2.10 cm RV S prime:     12.30 cm/s TAPSE (M-mode): 2.2 cm LEFT ATRIUM             Index        RIGHT ATRIUM           Index LA diam:        3.50 cm 1.48 cm/m   RA Area:     13.80 cm LA Vol (A2C):   48.6 ml 20.54 ml/m  RA Volume:   33.10 ml  13.99 ml/m LA Vol (A4C):   51.1 ml 21.60 ml/m LA Biplane Vol: 51.1 ml 21.60 ml/m  AORTIC VALVE LVOT Vmax:   120.00 cm/s LVOT Vmean:  74.400 cm/s LVOT VTI:    0.237 m  AORTA Ao Root diam: 3.40 cm Ao Asc diam:  3.60 cm MITRAL VALVE MV Area (PHT): 3.60 cm    SHUNTS  MV Decel Time: 211 msec    Systemic VTI:  0.24 m MV E velocity: 96.90 cm/s  Systemic Diam: 2.00 cm MV  A velocity: 91.20 cm/s MV E/A ratio:  1.06 Riley Lam MD Electronically signed by Riley Lam MD Signature Date/Time: 10/03/2023/1:15:03 PM    Final      I have independently reviewed the above radiologic studies and discussed with the patient   Recent Lab Findings: Lab Results  Component Value Date   WBC 9.2 10/05/2023   HGB 13.2 10/05/2023   HCT 36.5 (L) 10/05/2023   PLT 146 (L) 10/05/2023   GLUCOSE 156 (H) 10/05/2023   CHOL 169 04/18/2022   TRIG 260 (H) 04/18/2022   HDL 32 (L) 04/18/2022   LDLCALC 93 04/18/2022   ALT 18 10/03/2023   AST 21 10/03/2023   NA 134 (L) 10/05/2023   K 3.6 10/05/2023   CL 103 10/05/2023   CREATININE 1.03 10/05/2023   BUN 10 10/05/2023   CO2 21 (L) 10/05/2023   HGBA1C 10.8 (H) 10/03/2023    Assessment / Plan:    Multivessel CAD, Ruled in for NSTEMI- requesting CABG consult Morbid Obesity Polysubstance Abuse- Marijuana use daily, cocaine abuse per patient hasn't used in a months Nicotine Abuse-smoking 1.5-2 ppd  DM- poorly controlled, A1c is 10.8 HTN Depression  Patient is currently chest pain free.  Request is for CABG procedure for multivessel CAD.  The patient is non compliant with medication prior to admission.  He was counseled on the importance of taking medication post operatively to optimize the outcome from his bypass procedure.  He is currently homeless living at the [facility], CSW has been consulted.  Complete preoperative workup.  Dr. Cliffton Asters will evaluate for possible surgery on Wednesday  I  spent 40 minutes counseling the patient face to face.   Lowella Dandy, PA-C 10/05/2023 10:43 AM   Agree with above 42yo male admitted with NSTEMI.  Hx of polysubstance abuse.  OR tomorrow for CABG  Harrell O Lightfoot

## 2023-10-05 NOTE — Progress Notes (Signed)
PHARMACY - ANTICOAGULATION CONSULT NOTE  Pharmacy Consult for heparin Indication: chest pain/ACS  Allergies  Allergen Reactions   Bee Pollen     Other reaction(s): Other (See Comments) Sinus drainage Other reaction(s): Other (See Comments) Other reaction(s): Other (See Comments) Sinus drainage Sinus drainage    Pollen Extract Other (See Comments)    Sinus drainage    Patient Measurements: Height: 6\' 1"  (185.4 cm) Weight: 113.4 kg (250 lb) IBW/kg (Calculated) : 79.9 Heparin Dosing Weight: 103 kg   Vital Signs: Temp: 99.5 F (37.5 C) (11/18 0451) Temp Source: Oral (11/18 0451) BP: 122/69 (11/18 0451) Pulse Rate: 90 (11/18 0451)  Labs: Recent Labs    10/03/23 0840 10/03/23 1016 10/04/23 0441 10/04/23 1351 10/05/23 0727  HGB 16.3  --  13.2  --  13.2  HCT 44.7  --  36.6*  --  36.5*  PLT 188  --  154  --  146*  HEPARINUNFRC  --   --  <0.10* <0.10* <0.10*  CREATININE 0.92  --   --   --  1.03  TROPONINIHS 491* 290*  --   --   --     Estimated Creatinine Clearance: 120.8 mL/min (by C-G formula based on SCr of 1.03 mg/dL).   Medical History: Past Medical History:  Diagnosis Date   Allergies    Anxiety    Asthma    Depression    Essential hypertension    GERD (gastroesophageal reflux disease)    Hyperlipidemia    Type 2 diabetes mellitus (HCC)     Assessment: 45 YOM presenting with CP, he is not on anticoagulation PTA. Patient with multivessel disease, CVTS to assess.   Heparin level remains undetectable x3 on 1800 units/hr. Hgb: 13.2, plt 146. No pauses or issues with the infusion or signs of bleeding per RN.   Goal of Therapy:  Heparin level 0.3-0.7 units/ml Monitor platelets by anticoagulation protocol: Yes   Plan:  Heparin 3000 units x 1 bolus  Increase heparin to 2000 units/hr  Heparin level in 6 hours and daily while on heparin  Monitor CBC and for s/sx of bleeding   Thank you for involving pharmacy in the patient's care.   Trixie Rude,  PharmD Clinical Pharmacist 10/05/2023  9:36 AM

## 2023-10-05 NOTE — Inpatient Diabetes Management (Signed)
Inpatient Diabetes Program Recommendations  AACE/ADA: New Consensus Statement on Inpatient Glycemic Control (2015)  Target Ranges:  Prepandial:   less than 140 mg/dL      Peak postprandial:   less than 180 mg/dL (1-2 hours)      Critically ill patients:  140 - 180 mg/dL   Lab Results  Component Value Date   GLUCAP 128 (H) 10/05/2023   HGBA1C 10.8 (H) 10/03/2023    Review of Glycemic Control  Latest Reference Range & Units 10/04/23 16:47 10/04/23 21:27 10/05/23 06:40 10/05/23 08:52 10/05/23 11:59  Glucose-Capillary 70 - 99 mg/dL 161 (H) 096 (H) 045 (H) 136 (H) 128 (H)  (H): Data is abnormally high  Diabetes history: DM2 Outpatient Diabetes medications: Lantus 30 units every day, Januvia 100 mg every day, Metformin XR 1000 mg BID Current orders for Inpatient glycemic control: Novolog 0-20 units TID and 0-5 units at bedtime, Novolog 6 units TID  Spoke with patient at bedside.  He is not taking any of his DM medications due to cost.  Has not taken his insulin in approx 4 months.  He does not check his blood glucose.  He has a meter "somewhere".  Reviewed patient's current A1c of 10.8%  (average BG of 268 mg/dL). Explained what a A1c is and what it measures. Also reviewed goal A1c with patient, importance of good glucose control @ home, and blood sugar goals.   Told him about the [employer] ReliOn Novolin 70/30 insulin he could purchase for $43 for a box of 5 insulin pens.  He states he cannot afford that either.  He does not seem to be interested in any education at this time.  Will attach education to AVS and have ordered the Living Well with DM booklet.  Will continue to follow while inpatient.  Thank you, Dulce Sellar, MSN, CDCES Diabetes Coordinator Inpatient Diabetes Program [phone] (team pager from 8a-5p)

## 2023-10-05 NOTE — Plan of Care (Signed)

## 2023-10-05 NOTE — Discharge Instructions (Addendum)

## 2023-10-05 NOTE — TOC Initial Note (Signed)
Transition of Care [facility]) - Initial/Assessment Note    Patient Details  Name: Edwin Ford MRN: [id number] Date of Birth: 04-29-1981  Transition of Care [facility]) CM/SW Contact:    Delilah Shan, LCSWA Phone Number: 10/05/2023, 11:56 AM  Clinical Narrative:                  CSW received consult for patient. CSW spoke with patient at bedside. Patient reports PTA he was staying in [place]. Patient reports he is going to see if his sister can pick him up when ready for dc to take him back to his car that he reports is at his sisters house. CSW offered patient shelter resources, food resources,transportation resources, and [employer]. Patient accepted all resources. All questions answered. No further questions reported at this time.       Patient Goals and CMS Choice            Expected Discharge Plan and Services                                              Prior Living Arrangements/Services                       Activities of Daily Living   ADL Screening (condition at time of admission) Independently performs ADLs?: Yes (appropriate for developmental age) Is the patient deaf or have difficulty hearing?: No Does the patient have difficulty seeing, even when wearing glasses/contacts?: No Does the patient have difficulty concentrating, remembering, or making decisions?: No  Permission Sought/Granted                  Emotional Assessment              Admission diagnosis:  NSTEMI (non-ST elevated myocardial infarction) (HCC) [I21.4] Secondary hypertension [I15.9] Chest pain, unspecified type [R07.9] Type 2 diabetes mellitus without complication, unspecified whether long term insulin use (HCC) [E11.9] Patient Active Problem List   Diagnosis Date Noted   NSTEMI (non-ST elevated myocardial infarction) (HCC) 10/03/2023   Depression 01/22/2016   Type 2 diabetes mellitus with complication (HCC) 09/19/2015   Essential  hypertension, benign 09/19/2015   Hyperlipidemia 09/19/2015   Cigarette nicotine dependence, uncomplicated 09/19/2015   Morbid obesity (HCC) 09/19/2015   PCP:  Bess Kinds, MD Pharmacy:   [facility] [phone] - [place], [place] - [address] AT [facility] [address] & [address] [place] [address] Phone: [phone] Fax: [phone]     Social Determinants of Health (SDOH) Social History: SDOH Screenings   Food Insecurity: Food Insecurity Present (10/03/2023)  Housing: High Risk (10/03/2023)  Transportation Needs: No Transportation Needs (10/03/2023)  Utilities: Not At Risk (10/03/2023)  Depression (PHQ2-9): Low Risk  (06/13/2022)  Financial Resource Strain: Not on File (07/11/2021)   Received from [place], [place]  Physical Activity: Not on File (07/11/2021)   Received from [place], [place]  Social Connections: Not on File (08/01/2023)   Received from [facility]  Stress: Not on File (07/11/2021)   Received from [place], [place]  Tobacco Use: High Risk (10/03/2023)   SDOH Interventions:     Readmission Risk Interventions     No data to display

## 2023-10-06 ENCOUNTER — Inpatient Hospital Stay (HOSPITAL_COMMUNITY): Payer: 59

## 2023-10-06 DIAGNOSIS — I214 Non-ST elevation (NSTEMI) myocardial infarction: Secondary | ICD-10-CM | POA: Diagnosis not present

## 2023-10-06 DIAGNOSIS — Z01810 Encounter for preprocedural cardiovascular examination: Secondary | ICD-10-CM

## 2023-10-06 LAB — URINALYSIS, ROUTINE W REFLEX MICROSCOPIC
Bacteria, UA: NONE SEEN
Bilirubin Urine: NEGATIVE
Glucose, UA: NEGATIVE mg/dL
Ketones, ur: NEGATIVE mg/dL
Leukocytes,Ua: NEGATIVE
Nitrite: NEGATIVE
Protein, ur: NEGATIVE mg/dL
Specific Gravity, Urine: 1.005 (ref 1.005–1.030)
pH: 6 (ref 5.0–8.0)

## 2023-10-06 LAB — PROTIME-INR
INR: 1 (ref 0.8–1.2)
Prothrombin Time: 13.5 s (ref 11.4–15.2)

## 2023-10-06 LAB — SURGICAL PCR SCREEN
MRSA, PCR: NEGATIVE
Staphylococcus aureus: NEGATIVE

## 2023-10-06 LAB — BASIC METABOLIC PANEL
Anion gap: 7 (ref 5–15)
BUN: 10 mg/dL (ref 6–20)
CO2: 22 mmol/L (ref 22–32)
Calcium: 8.3 mg/dL — ABNORMAL LOW (ref 8.9–10.3)
Chloride: 105 mmol/L (ref 98–111)
Creatinine, Ser: 0.98 mg/dL (ref 0.61–1.24)
GFR, Estimated: >60 mL/min (ref >60)
Glucose, Bld: 171 mg/dL — ABNORMAL HIGH (ref 70–99)
Potassium: 3.7 mmol/L (ref 3.5–5.1)
Sodium: 134 mmol/L — ABNORMAL LOW (ref 135–145)

## 2023-10-06 LAB — CBC
HCT: 32.5 % — ABNORMAL LOW (ref 39.0–52.0)
Hemoglobin: 11.7 g/dL — ABNORMAL LOW (ref 13.0–17.0)
MCH: 30.8 pg (ref 26.0–34.0)
MCHC: 36 g/dL (ref 30.0–36.0)
MCV: 85.5 fL (ref 80.0–100.0)
Platelets: 142 10*3/uL — ABNORMAL LOW (ref 150–400)
RBC: 3.8 MIL/uL — ABNORMAL LOW (ref 4.22–5.81)
RDW: 12.3 % (ref 11.5–15.5)
WBC: 7.9 10*3/uL (ref 4.0–10.5)
nRBC: 0 % (ref 0.0–0.2)

## 2023-10-06 LAB — BLOOD GAS, ARTERIAL
Acid-Base Excess: 0.1 mmol/L (ref 0.0–2.0)
Bicarbonate: 23.6 mmol/L (ref 20.0–28.0)
O2 Saturation: 100 %
Patient temperature: 37
pCO2 arterial: 34 mm[Hg] (ref 32–48)
pH, Arterial: 7.45 (ref 7.35–7.45)
pO2, Arterial: 144 mm[Hg] — ABNORMAL HIGH (ref 83–108)

## 2023-10-06 LAB — APTT: aPTT: 65 s — ABNORMAL HIGH (ref 24–36)

## 2023-10-06 LAB — HEPARIN LEVEL (UNFRACTIONATED)
Heparin Unfractionated: 0.19 [IU]/mL — ABNORMAL LOW (ref 0.30–0.70)
Heparin Unfractionated: 0.42 [IU]/mL (ref 0.30–0.70)

## 2023-10-06 LAB — GLUCOSE, CAPILLARY
Glucose-Capillary: 158 mg/dL — ABNORMAL HIGH (ref 70–99)
Glucose-Capillary: 250 mg/dL — ABNORMAL HIGH (ref 70–99)
Glucose-Capillary: 107 mg/dL — ABNORMAL HIGH (ref 70–99)
Glucose-Capillary: 118 mg/dL — ABNORMAL HIGH (ref 70–99)

## 2023-10-06 LAB — PREPARE RBC (CROSSMATCH)

## 2023-10-06 LAB — ABO/RH: ABO/RH(D): B POS

## 2023-10-06 LAB — SARS CORONAVIRUS 2 BY RT PCR: SARS Coronavirus 2 by RT PCR: NEGATIVE

## 2023-10-06 LAB — LIPOPROTEIN A (LPA): Lipoprotein (a): 28.7 nmol/L (ref <75.0)

## 2023-10-06 MED ORDER — CHLORHEXIDINE GLUCONATE 4 % EX SOLN
60.0000 mL | Freq: Once | CUTANEOUS | Status: AC
  Administered 2023-10-07: 4 via TOPICAL
  Filled 2023-10-06: qty 15

## 2023-10-06 MED ORDER — CEFAZOLIN SODIUM-DEXTROSE 2-4 GM/100ML-% IV SOLN
2.0000 g | INTRAVENOUS | Status: DC
  Filled 2023-10-06: qty 100

## 2023-10-06 MED ORDER — CHLORHEXIDINE GLUCONATE CLOTH 2 % EX PADS
6.0000 | MEDICATED_PAD | Freq: Once | CUTANEOUS | Status: AC
  Administered 2023-10-07: 6 via TOPICAL

## 2023-10-06 MED ORDER — CHLORHEXIDINE GLUCONATE 0.12 % MT SOLN
15.0000 mL | Freq: Once | OROMUCOSAL | Status: AC
Start: 2023-10-07 — End: 2023-10-07
  Administered 2023-10-07: 15 mL via OROMUCOSAL
  Filled 2023-10-06: qty 15

## 2023-10-06 MED ORDER — SODIUM CHLORIDE 0.9 % IV SOLN
1.5000 mg/kg/h | INTRAVENOUS | Status: DC
  Filled 2023-10-06: qty 25

## 2023-10-06 MED ORDER — NOREPINEPHRINE 4 MG/250ML-% IV SOLN
0.0000 ug/min | INTRAVENOUS | Status: DC
  Filled 2023-10-06: qty 250

## 2023-10-06 MED ORDER — TEMAZEPAM 15 MG PO CAPS
15.0000 mg | ORAL_CAPSULE | Freq: Once | ORAL | Status: AC | PRN
  Administered 2023-10-06: 15 mg via ORAL
  Filled 2023-10-06: qty 1

## 2023-10-06 MED ORDER — NITROGLYCERIN IN D5W 200-5 MCG/ML-% IV SOLN
2.0000 ug/min | INTRAVENOUS | Status: DC
  Filled 2023-10-06: qty 250

## 2023-10-06 MED ORDER — INSULIN REGULAR(HUMAN) IN NACL 100-0.9 UT/100ML-% IV SOLN
INTRAVENOUS | Status: DC
  Filled 2023-10-06: qty 100

## 2023-10-06 MED ORDER — PLASMA-LYTE A IV SOLN
INTRAVENOUS | Status: DC
  Filled 2023-10-06 (×2): qty 5

## 2023-10-06 MED ORDER — POTASSIUM CHLORIDE 2 MEQ/ML IV SOLN
80.0000 meq | INTRAVENOUS | Status: DC
  Filled 2023-10-06: qty 40

## 2023-10-06 MED ORDER — BISACODYL 5 MG PO TBEC
5.0000 mg | DELAYED_RELEASE_TABLET | Freq: Once | ORAL | Status: AC
  Administered 2023-10-06: 5 mg via ORAL
  Filled 2023-10-06: qty 1

## 2023-10-06 MED ORDER — METOPROLOL TARTRATE 12.5 MG HALF TABLET
12.5000 mg | ORAL_TABLET | Freq: Once | ORAL | Status: AC
  Administered 2023-10-07: 12.5 mg via ORAL
  Filled 2023-10-06: qty 1

## 2023-10-06 MED ORDER — CHLORHEXIDINE GLUCONATE CLOTH 2 % EX PADS
6.0000 | MEDICATED_PAD | Freq: Once | CUTANEOUS | Status: AC
  Administered 2023-10-06: 6 via TOPICAL

## 2023-10-06 MED ORDER — MILRINONE LACTATE IN DEXTROSE 20-5 MG/100ML-% IV SOLN
0.3000 ug/kg/min | INTRAVENOUS | Status: DC
  Filled 2023-10-06: qty 100

## 2023-10-06 MED ORDER — PLASMA-LYTE A IV SOLN
INTRAVENOUS | Status: DC
  Filled 2023-10-06: qty 2.5

## 2023-10-06 MED ORDER — VANCOMYCIN HCL 1500 MG/300ML IV SOLN
1500.0000 mg | INTRAVENOUS | Status: DC
  Filled 2023-10-06: qty 300

## 2023-10-06 MED ORDER — PHENYLEPHRINE HCL-NACL 20-0.9 MG/250ML-% IV SOLN
30.0000 ug/min | INTRAVENOUS | Status: DC
  Filled 2023-10-06: qty 250

## 2023-10-06 MED ORDER — DEXMEDETOMIDINE HCL IN NACL 400 MCG/100ML IV SOLN
0.1000 ug/kg/h | INTRAVENOUS | Status: DC
  Filled 2023-10-06: qty 100

## 2023-10-06 MED ORDER — HEPARIN 30,000 UNITS/1000 ML (OHS) CELLSAVER SOLUTION
Status: DC
  Filled 2023-10-06: qty 1000

## 2023-10-06 MED ORDER — TRANEXAMIC ACID (OHS) BOLUS VIA INFUSION
15.0000 mg/kg | INTRAVENOUS | Status: DC
  Filled 2023-10-06: qty 1701

## 2023-10-06 MED ORDER — HEPARIN BOLUS VIA INFUSION
3000.0000 [IU] | Freq: Once | INTRAVENOUS | Status: AC
  Administered 2023-10-06: 3000 [IU] via INTRAVENOUS
  Filled 2023-10-06: qty 3000

## 2023-10-06 MED ORDER — CHLORHEXIDINE GLUCONATE 4 % EX SOLN
60.0000 mL | Freq: Once | CUTANEOUS | Status: AC
  Administered 2023-10-06: 4 via TOPICAL
  Filled 2023-10-06: qty 15

## 2023-10-06 MED ORDER — MANNITOL 20 % IV SOLN
INTRAVENOUS | Status: DC
Start: 2023-10-07 — End: 2023-10-08
  Filled 2023-10-06 (×2): qty 13

## 2023-10-06 MED ORDER — EPINEPHRINE HCL 5 MG/250ML IV SOLN IN NS
0.0000 ug/min | INTRAVENOUS | Status: DC
  Filled 2023-10-06: qty 250

## 2023-10-06 MED ORDER — TRANEXAMIC ACID (OHS) PUMP PRIME SOLUTION
2.0000 mg/kg | INTRAVENOUS | Status: DC
Start: 2023-10-07 — End: 2023-10-08
  Filled 2023-10-06: qty 2.27

## 2023-10-06 MED ORDER — PLASMA-LYTE A IV SOLN
INTRAVENOUS | Status: DC
  Filled 2023-10-06: qty 5

## 2023-10-06 NOTE — Progress Notes (Addendum)
   Patient Name: Edwin Ford Date of Encounter: 10/06/2023 [facility] Cardiologist: None   Interval Summary  .    Had chest pain this morning. Doesn't really answer questions about it when asked. No eye contact, laying on his side.   Vital Signs .    Vitals:   10/05/23 1156 10/05/23 2042 10/05/23 2245 10/06/23 0455  BP: (!) 145/90 137/78 137/78   Pulse: 83 87 87 87  Resp: 17 18  18   Temp: 98.8 F (37.1 C) 99.7 F (37.6 C)    TempSrc: Oral Oral    SpO2:  99%  98%  Weight:      Height:        Intake/Output Summary (Last 24 hours) at 10/06/2023 0824 Last data filed at 10/06/2023 0131 Gross per 24 hour  Intake 475.45 ml  Output --  Net 475.45 ml      10/04/2023   12:39 AM 10/03/2023    4:49 PM 10/03/2023    9:32 AM  Last 3 Weights  Weight (lbs) 250 lb 250 lb 250 lb  Weight (kg) 113.4 kg 113.4 kg 113.399 kg      Telemetry/ECG    Sinus Rhythm - Personally Reviewed  Physical Exam .   GEN: No acute distress.   Neck: No JVD Cardiac: RRR, no murmurs, rubs, or gallops.  Respiratory: Clear to auscultation bilaterally. GI: Soft, nontender, non-distended  MS: No edema  Assessment & Plan .     42 y.o. male   with a history of hypertension, hyperlipidemia, type 2 diabetes, and significant obesity, presented with a new onset NSTEMI.    Non-STEMI -- High-sensitivity troponin peaked at 491.  Underwent cardiac catheterization 11/16 with severe multivessel CAD. Pending TCTS MD consult, potential CABG on 11/20. Echo showed LVEF of 50 to 55%, no regional wall motion abnormalities, mild asymmetric LVH in the inferior segment, grade 2 diastolic dysfunction, anterior and lateral hypokinesis. -- Continue IV heparin, NTG, aspirin, metoprolol tartrate 25 mg twice daily, atorvastatin 80 mg daily, Zetia 10 mg daily   Hypertension -- Controlled -- Continue IV NTG, metoprolol tartrate 25 mg twice daily   Hyperlipidemia -- LDL 81, HDL 37, LP(a) 28 -- Continue  atorvastatin 80 mg daily, Zetia 10 mg daily   Diabetes -- Hgb A1c 10.8 -- Metformin on hold, SSI while inpatient -- Consider addition of SGLT2 prior to discharge   Tobacco use -- Cessation advised   Obesity -- Lifestyle modification   Social issues -- he is currently homeless, not on any medications prior to admission. Living in motels  -- social work following   For questions or updates, please contact [facility] Please consult [email or web address] for contact info under        Signed, Laverda Page, NP   I have personally seen and examined this patient. I agree with the assessment and plan as outlined above.  Pt is stable today. Mild chest pain overnight. CT surgery consult pending to discuss CABG, potentially this week.  Continue IV heparin, IV NTG, ASA, statin and beta blocker.   Verne Carrow, MD, [facility] 10/06/2023 9:21 AM

## 2023-10-06 NOTE — Progress Notes (Signed)
Pt received OHS book, IS, and Move in the Tube sheet. Pt did not seem that interested in receiving education. Pt may not have anybody that can stay with him after surgery.   Edwin Congress MS, ACSM-CEP 10/06/2023 11:48 AM  [phone]

## 2023-10-06 NOTE — Progress Notes (Signed)
Pt complained of CP again at 0530. Pt stated CP same as before 4/10.  Increased nitroglycerin to 50 mcg/min, put Pt on [place] at 4L and gave morphine 2 mg at 0550. Pt got relief. Will continue to monitor.

## 2023-10-06 NOTE — Progress Notes (Signed)
PHARMACY - ANTICOAGULATION CONSULT NOTE  Pharmacy Consult for heparin Indication: chest pain/ACS  Allergies  Allergen Reactions   Bee Pollen     Other reaction(s): Other (See Comments) Sinus drainage Other reaction(s): Other (See Comments) Other reaction(s): Other (See Comments) Sinus drainage Sinus drainage    Pollen Extract Other (See Comments)    Sinus drainage    Patient Measurements: Height: 6\' 1"  (185.4 cm) Weight: 113.4 kg (250 lb) IBW/kg (Calculated) : 79.9 Heparin Dosing Weight: 103 kg   Vital Signs: Temp: 99.7 F (37.6 C) (11/18 2042) Temp Source: Oral (11/18 2042) BP: 137/78 (11/18 2245) Pulse Rate: 87 (11/19 0455)  Labs: Recent Labs    10/03/23 0840 10/03/23 1016 10/04/23 0441 10/04/23 1351 10/05/23 0727 10/05/23 1636 10/06/23 0554  HGB 16.3  --  13.2  --  13.2  --  11.7*  HCT 44.7  --  36.6*  --  36.5*  --  32.5*  PLT 188  --  154  --  146*  --  142*  HEPARINUNFRC  --   --  <0.10*   < > <0.10* 0.14* 0.19*  CREATININE 0.92  --   --   --  1.03  --  0.98  TROPONINIHS 491* 290*  --   --   --   --   --    < > = values in this interval not displayed.    Estimated Creatinine Clearance: 126.9 mL/min (by C-G formula based on SCr of 0.98 mg/dL).   Assessment: 31 YOM presenting with CP, he is not on anticoagulation PTA. Patient with multivessel disease, CVTS to assess.   Heparin level (0.14) subtherapeutic on 1800 units/hr. No pauses or issues with the infusion or signs of bleeding per RN.   11/19 AM update:  Heparin level sub-therapeutic   Goal of Therapy:  Heparin level 0.3-0.7 units/ml Monitor platelets by anticoagulation protocol: Yes   Plan:  Heparin 3000 units x 1 bolus  Increase heparin to 2600 units/hr  Heparin level in 6-8 hours   Abran Duke, PharmD, BCPS Clinical Pharmacist Phone: [phone]

## 2023-10-06 NOTE — Progress Notes (Signed)
Pt states he woke up with chest pain and tightness like he had felt previously. Increased nitroglycerin dose at 0412 to 59mcg/min at 63mL/hr. Pt still had no relief 3 minutes later. Increased dose again at 0418 to 68mcg/min at 13.33mL/hr. This gave Pt relief. BP 133/76 MAP 94 HR 84. No SOB and no CP at this time. Will continue to monitor.

## 2023-10-06 NOTE — Plan of Care (Signed)

## 2023-10-06 NOTE — Progress Notes (Signed)
PHARMACY - ANTICOAGULATION CONSULT NOTE  Pharmacy Consult for heparin Indication: chest pain/ACS  Allergies  Allergen Reactions   Bee Pollen     Other reaction(s): Other (See Comments) Sinus drainage Other reaction(s): Other (See Comments) Other reaction(s): Other (See Comments) Sinus drainage Sinus drainage    Pollen Extract Other (See Comments)    Sinus drainage    Patient Measurements: Height: 6\' 1"  (185.4 cm) Weight: 113.4 kg (250 lb) IBW/kg (Calculated) : 79.9 Heparin Dosing Weight: 103 kg   Vital Signs: Pulse Rate: 87 (11/19 0455)  Labs: Recent Labs    10/04/23 0441 10/04/23 1351 10/05/23 0727 10/05/23 1636 10/06/23 0554 10/06/23 1421  HGB 13.2  --  13.2  --  11.7*  --   HCT 36.6*  --  36.5*  --  32.5*  --   PLT 154  --  146*  --  142*  --   HEPARINUNFRC <0.10*   < > <0.10* 0.14* 0.19* 0.42  CREATININE  --   --  1.03  --  0.98  --    < > = values in this interval not displayed.    Estimated Creatinine Clearance: 126.9 mL/min (by C-G formula based on SCr of 0.98 mg/dL).   Assessment: 59 YOM presenting with CP, he is not on anticoagulation PTA. Patient with multivessel disease, CVTS to assess.   Heparin level (0.42) subtherapeutic on 2600 units/hr. Pt for CABG on 11/20    Goal of Therapy:  Heparin level 0.3-0.7 units/ml Monitor platelets by anticoagulation protocol: Yes   Plan:  -Continue heparin at 2600 units/hr -Will not recheck heparin level as heparin will stop for surgery on 11/20  Harland German, PharmD Clinical Pharmacist **Pharmacist phone directory can now be found on [email or web address] ([email or web address]).  Listed under [facility].

## 2023-10-07 ENCOUNTER — Inpatient Hospital Stay (HOSPITAL_COMMUNITY): Payer: Self-pay | Admitting: Anesthesiology

## 2023-10-07 ENCOUNTER — Inpatient Hospital Stay (HOSPITAL_COMMUNITY): Payer: 59

## 2023-10-07 ENCOUNTER — Inpatient Hospital Stay (HOSPITAL_COMMUNITY): Payer: 59 | Admitting: Anesthesiology

## 2023-10-07 ENCOUNTER — Inpatient Hospital Stay (HOSPITAL_COMMUNITY)
Admission: EM | Disposition: A | Discharge status: Home / Self Care | Payer: Self-pay | Source: Home / Self Care | Attending: Thoracic Surgery (Cardiothoracic Vascular Surgery)

## 2023-10-07 ENCOUNTER — Encounter (HOSPITAL_COMMUNITY): Payer: Self-pay | Admitting: Cardiology

## 2023-10-07 DIAGNOSIS — I214 Non-ST elevation (NSTEMI) myocardial infarction: Secondary | ICD-10-CM | POA: Diagnosis not present

## 2023-10-07 DIAGNOSIS — E119 Type 2 diabetes mellitus without complications: Secondary | ICD-10-CM | POA: Diagnosis not present

## 2023-10-07 DIAGNOSIS — F17210 Nicotine dependence, cigarettes, uncomplicated: Secondary | ICD-10-CM

## 2023-10-07 DIAGNOSIS — I2510 Atherosclerotic heart disease of native coronary artery without angina pectoris: Secondary | ICD-10-CM

## 2023-10-07 HISTORY — PX: TEE WITHOUT CARDIOVERSION: SHX5443

## 2023-10-07 HISTORY — PX: CORONARY ARTERY BYPASS GRAFT: SHX141

## 2023-10-07 LAB — BASIC METABOLIC PANEL
Anion gap: 11 (ref 5–15)
BUN: 9 mg/dL (ref 6–20)
CO2: 20 mmol/L — ABNORMAL LOW (ref 22–32)
Calcium: 8.9 mg/dL (ref 8.9–10.3)
Chloride: 104 mmol/L (ref 98–111)
Creatinine, Ser: 0.93 mg/dL (ref 0.61–1.24)
GFR, Estimated: >60 mL/min (ref >60)
Glucose, Bld: 136 mg/dL — ABNORMAL HIGH (ref 70–99)
Potassium: 3.8 mmol/L (ref 3.5–5.1)
Sodium: 135 mmol/L (ref 135–145)

## 2023-10-07 LAB — POCT I-STAT 7, (LYTES, BLD GAS, ICA,H+H)
Acid-base deficit: 2 mmol/L (ref 0.0–2.0)
Acid-base deficit: 3 mmol/L — ABNORMAL HIGH (ref 0.0–2.0)
Acid-base deficit: 6 mmol/L — ABNORMAL HIGH (ref 0.0–2.0)
Acid-base deficit: 6 mmol/L — ABNORMAL HIGH (ref 0.0–2.0)
Acid-base deficit: 10 mmol/L — ABNORMAL HIGH (ref 0.0–2.0)
Bicarbonate: 23.4 mmol/L (ref 20.0–28.0)
Bicarbonate: 22.5 mmol/L (ref 20.0–28.0)
Bicarbonate: 20 mmol/L (ref 20.0–28.0)
Bicarbonate: 20.9 mmol/L (ref 20.0–28.0)
Bicarbonate: 13.2 mmol/L — ABNORMAL LOW (ref 20.0–28.0)
Calcium, Ion: 1 mmol/L — ABNORMAL LOW (ref 1.15–1.40)
Calcium, Ion: 1.06 mmol/L — ABNORMAL LOW (ref 1.15–1.40)
Calcium, Ion: 1.22 mmol/L (ref 1.15–1.40)
Calcium, Ion: 1.22 mmol/L (ref 1.15–1.40)
Calcium, Ion: 0.89 mmol/L — CL (ref 1.15–1.40)
HCT: 28 % — ABNORMAL LOW (ref 39.0–52.0)
HCT: 28 % — ABNORMAL LOW (ref 39.0–52.0)
HCT: 29 % — ABNORMAL LOW (ref 39.0–52.0)
HCT: 25 % — ABNORMAL LOW (ref 39.0–52.0)
HCT: 24 % — ABNORMAL LOW (ref 39.0–52.0)
Hemoglobin: 9.5 g/dL — ABNORMAL LOW (ref 13.0–17.0)
Hemoglobin: 9.5 g/dL — ABNORMAL LOW (ref 13.0–17.0)
Hemoglobin: 9.9 g/dL — ABNORMAL LOW (ref 13.0–17.0)
Hemoglobin: 8.5 g/dL — ABNORMAL LOW (ref 13.0–17.0)
Hemoglobin: 8.2 g/dL — ABNORMAL LOW (ref 13.0–17.0)
O2 Saturation: 100 %
O2 Saturation: 100 %
O2 Saturation: 100 %
O2 Saturation: 99 %
O2 Saturation: 100 %
Patient temperature: 36.8
Patient temperature: 38.4
Potassium: 4.1 mmol/L (ref 3.5–5.1)
Potassium: 5.3 mmol/L — ABNORMAL HIGH (ref 3.5–5.1)
Potassium: 4.4 mmol/L (ref 3.5–5.1)
Potassium: 4.1 mmol/L (ref 3.5–5.1)
Potassium: 2.8 mmol/L — ABNORMAL LOW (ref 3.5–5.1)
Sodium: 137 mmol/L (ref 135–145)
Sodium: 133 mmol/L — ABNORMAL LOW (ref 135–145)
Sodium: 137 mmol/L (ref 135–145)
Sodium: 139 mmol/L (ref 135–145)
Sodium: 145 mmol/L (ref 135–145)
TCO2: 25 mmol/L (ref 22–32)
TCO2: 24 mmol/L (ref 22–32)
TCO2: 21 mmol/L — ABNORMAL LOW (ref 22–32)
TCO2: 22 mmol/L (ref 22–32)
TCO2: 14 mmol/L — ABNORMAL LOW (ref 22–32)
pCO2 arterial: 40.7 mm[Hg] (ref 32–48)
pCO2 arterial: 43 mm[Hg] (ref 32–48)
pCO2 arterial: 41.3 mm[Hg] (ref 32–48)
pCO2 arterial: 44.9 mm[Hg] (ref 32–48)
pCO2 arterial: 22.9 mm[Hg] — ABNORMAL LOW (ref 32–48)
pH, Arterial: 7.368 (ref 7.35–7.45)
pH, Arterial: 7.326 — ABNORMAL LOW (ref 7.35–7.45)
pH, Arterial: 7.293 — ABNORMAL LOW (ref 7.35–7.45)
pH, Arterial: 7.274 — ABNORMAL LOW (ref 7.35–7.45)
pH, Arterial: 7.376 (ref 7.35–7.45)
pO2, Arterial: 387 mm[Hg] — ABNORMAL HIGH (ref 83–108)
pO2, Arterial: 235 mm[Hg] — ABNORMAL HIGH (ref 83–108)
pO2, Arterial: 463 mm[Hg] — ABNORMAL HIGH (ref 83–108)
pO2, Arterial: 145 mm[Hg] — ABNORMAL HIGH (ref 83–108)
pO2, Arterial: 171 mm[Hg] — ABNORMAL HIGH (ref 83–108)

## 2023-10-07 LAB — CBC
HCT: 38.9 % — ABNORMAL LOW (ref 39.0–52.0)
HCT: 27.9 % — ABNORMAL LOW (ref 39.0–52.0)
Hemoglobin: 14.3 g/dL (ref 13.0–17.0)
Hemoglobin: 9.7 g/dL — ABNORMAL LOW (ref 13.0–17.0)
MCH: 31.3 pg (ref 26.0–34.0)
MCH: 29.8 pg (ref 26.0–34.0)
MCHC: 36.8 g/dL — ABNORMAL HIGH (ref 30.0–36.0)
MCHC: 34.8 g/dL (ref 30.0–36.0)
MCV: 85.1 fL (ref 80.0–100.0)
MCV: 85.6 fL (ref 80.0–100.0)
Platelets: 173 10*3/uL (ref 150–400)
Platelets: 107 10*3/uL — ABNORMAL LOW (ref 150–400)
RBC: 4.57 MIL/uL (ref 4.22–5.81)
RBC: 3.26 MIL/uL — ABNORMAL LOW (ref 4.22–5.81)
RDW: 12.3 % (ref 11.5–15.5)
RDW: 12.2 % (ref 11.5–15.5)
WBC: 9.2 10*3/uL (ref 4.0–10.5)
WBC: 7.4 10*3/uL (ref 4.0–10.5)
nRBC: 0 % (ref 0.0–0.2)
nRBC: 0 % (ref 0.0–0.2)

## 2023-10-07 LAB — POCT I-STAT, CHEM 8
BUN: 10 mg/dL (ref 6–20)
BUN: 10 mg/dL (ref 6–20)
BUN: 9 mg/dL (ref 6–20)
Calcium, Ion: 1.16 mmol/L (ref 1.15–1.40)
Calcium, Ion: 1.06 mmol/L — ABNORMAL LOW (ref 1.15–1.40)
Calcium, Ion: 1.26 mmol/L (ref 1.15–1.40)
Chloride: 100 mmol/L (ref 98–111)
Chloride: 102 mmol/L (ref 98–111)
Chloride: 106 mmol/L (ref 98–111)
Creatinine, Ser: 0.9 mg/dL (ref 0.61–1.24)
Creatinine, Ser: 0.8 mg/dL (ref 0.61–1.24)
Creatinine, Ser: 0.8 mg/dL (ref 0.61–1.24)
Glucose, Bld: 159 mg/dL — ABNORMAL HIGH (ref 70–99)
Glucose, Bld: 159 mg/dL — ABNORMAL HIGH (ref 70–99)
Glucose, Bld: 177 mg/dL — ABNORMAL HIGH (ref 70–99)
HCT: 32 % — ABNORMAL LOW (ref 39.0–52.0)
HCT: 25 % — ABNORMAL LOW (ref 39.0–52.0)
HCT: 28 % — ABNORMAL LOW (ref 39.0–52.0)
Hemoglobin: 10.9 g/dL — ABNORMAL LOW (ref 13.0–17.0)
Hemoglobin: 8.5 g/dL — ABNORMAL LOW (ref 13.0–17.0)
Hemoglobin: 9.5 g/dL — ABNORMAL LOW (ref 13.0–17.0)
Potassium: 4 mmol/L (ref 3.5–5.1)
Potassium: 5.3 mmol/L — ABNORMAL HIGH (ref 3.5–5.1)
Potassium: 4.5 mmol/L (ref 3.5–5.1)
Sodium: 136 mmol/L (ref 135–145)
Sodium: 135 mmol/L (ref 135–145)
Sodium: 137 mmol/L (ref 135–145)
TCO2: 26 mmol/L (ref 22–32)
TCO2: 26 mmol/L (ref 22–32)
TCO2: 23 mmol/L (ref 22–32)

## 2023-10-07 LAB — APTT: aPTT: 54 s — ABNORMAL HIGH (ref 24–36)

## 2023-10-07 LAB — GLUCOSE, CAPILLARY
Glucose-Capillary: 135 mg/dL — ABNORMAL HIGH (ref 70–99)
Glucose-Capillary: 154 mg/dL — ABNORMAL HIGH (ref 70–99)
Glucose-Capillary: 160 mg/dL — ABNORMAL HIGH (ref 70–99)
Glucose-Capillary: 150 mg/dL — ABNORMAL HIGH (ref 70–99)
Glucose-Capillary: 109 mg/dL — ABNORMAL HIGH (ref 70–99)
Glucose-Capillary: 102 mg/dL — ABNORMAL HIGH (ref 70–99)
Glucose-Capillary: 68 mg/dL — ABNORMAL LOW (ref 70–99)

## 2023-10-07 LAB — HEPARIN LEVEL (UNFRACTIONATED): Heparin Unfractionated: 0.43 [IU]/mL (ref 0.30–0.70)

## 2023-10-07 LAB — PROTIME-INR
INR: 1.2 (ref 0.8–1.2)
Prothrombin Time: 15 s (ref 11.4–15.2)

## 2023-10-07 SURGERY — CORONARY ARTERY BYPASS GRAFTING (CABG)
Anesthesia: General | Site: Chest

## 2023-10-07 MED ORDER — CHLORHEXIDINE GLUCONATE 0.12 % MT SOLN
15.0000 mL | OROMUCOSAL | Status: AC
  Administered 2023-10-07: 15 mL via OROMUCOSAL

## 2023-10-07 MED ORDER — VANCOMYCIN HCL 1000 MG IV SOLR
INTRAVENOUS | Status: DC | PRN
  Administered 2023-10-07: 1500 mg via INTRAVENOUS

## 2023-10-07 MED ORDER — CLEVIDIPINE BUTYRATE 0.5 MG/ML IV EMUL
0.0000 mg/h | INTRAVENOUS | Status: DC
  Administered 2023-10-07: 3 mg/h via INTRAVENOUS
  Filled 2023-10-07: qty 100

## 2023-10-07 MED ORDER — LACTATED RINGERS IV SOLN: INTRAVENOUS | Status: DC

## 2023-10-07 MED ORDER — MIDAZOLAM HCL 2 MG/2ML IJ SOLN
2.0000 mg | INTRAMUSCULAR | Status: DC | PRN
  Administered 2023-10-07: 2 mg via INTRAVENOUS
  Filled 2023-10-07: qty 2

## 2023-10-07 MED ORDER — PROTAMINE SULFATE 10 MG/ML IV SOLN
INTRAVENOUS | Status: AC
Start: 2023-10-07 — End: ?
  Filled 2023-10-07: qty 5

## 2023-10-07 MED ORDER — MAGNESIUM SULFATE 4 GM/100ML IV SOLN
4.0000 g | Freq: Once | INTRAVENOUS | Status: AC
  Administered 2023-10-07: 4 g via INTRAVENOUS
  Filled 2023-10-07: qty 100

## 2023-10-07 MED ORDER — OXYCODONE HCL 5 MG PO TABS
5.0000 mg | ORAL_TABLET | ORAL | Status: DC | PRN
  Administered 2023-10-08: 10 mg via ORAL
  Administered 2023-10-08: 5 mg via ORAL
  Administered 2023-10-08 (×2): 10 mg via ORAL
  Administered 2023-10-09 (×3): 5 mg via ORAL
  Administered 2023-10-09 – 2023-10-11 (×4): 10 mg via ORAL
  Filled 2023-10-07 (×8): qty 2
  Filled 2023-10-07: qty 1
  Filled 2023-10-07 (×3): qty 2

## 2023-10-07 MED ORDER — SODIUM CHLORIDE 0.9 % IV SOLN: 250.0000 mL | INTRAVENOUS | Status: DC

## 2023-10-07 MED ORDER — ASPIRIN 81 MG PO CHEW: 324.0000 mg | CHEWABLE_TABLET | Freq: Every day | ORAL | Status: DC

## 2023-10-07 MED ORDER — TRANEXAMIC ACID 1000 MG/10ML IV SOLN
INTRAVENOUS | Status: DC | PRN
  Administered 2023-10-07: 1701 mg via INTRAVENOUS

## 2023-10-07 MED ORDER — LACTATED RINGERS IV SOLN: INTRAVENOUS | Status: DC | PRN

## 2023-10-07 MED ORDER — DEXMEDETOMIDINE HCL IN NACL 400 MCG/100ML IV SOLN
0.0000 ug/kg/h | INTRAVENOUS | Status: DC
  Administered 2023-10-07: 0.7 ug/kg/h via INTRAVENOUS

## 2023-10-07 MED ORDER — SODIUM CHLORIDE 0.9 % IV SOLN: INTRAVENOUS | Status: DC | PRN

## 2023-10-07 MED ORDER — ROCURONIUM BROMIDE 10 MG/ML (PF) SYRINGE
PREFILLED_SYRINGE | INTRAVENOUS | Status: DC | PRN
  Administered 2023-10-07: 60 mg via INTRAVENOUS
  Administered 2023-10-07: 40 mg via INTRAVENOUS

## 2023-10-07 MED ORDER — PROPOFOL 10 MG/ML IV BOLUS
INTRAVENOUS | Status: AC
Start: 2023-10-07 — End: ?
  Filled 2023-10-07: qty 20

## 2023-10-07 MED ORDER — KETAMINE HCL 50 MG/5ML IJ SOSY
PREFILLED_SYRINGE | INTRAMUSCULAR | Status: AC
  Filled 2023-10-07: qty 5

## 2023-10-07 MED ORDER — 0.9 % SODIUM CHLORIDE (POUR BTL) OPTIME
TOPICAL | Status: DC | PRN
Start: 2023-10-07 — End: 2023-10-07
  Administered 2023-10-07: 5000 mL

## 2023-10-07 MED ORDER — FENTANYL CITRATE (PF) 250 MCG/5ML IJ SOLN
INTRAMUSCULAR | Status: AC
  Filled 2023-10-07: qty 5

## 2023-10-07 MED ORDER — ACETAMINOPHEN 500 MG PO TABS
1000.0000 mg | ORAL_TABLET | Freq: Four times a day (QID) | ORAL | Status: DC
  Administered 2023-10-08 – 2023-10-12 (×18): 1000 mg via ORAL
  Filled 2023-10-07 (×18): qty 2

## 2023-10-07 MED ORDER — INSULIN REGULAR(HUMAN) IN NACL 100-0.9 UT/100ML-% IV SOLN
INTRAVENOUS | Status: DC
  Administered 2023-10-07: 4.8 [IU]/h via INTRAVENOUS

## 2023-10-07 MED ORDER — MIDAZOLAM HCL (PF) 10 MG/2ML IJ SOLN
INTRAMUSCULAR | Status: AC
Start: 2023-10-07 — End: ?
  Filled 2023-10-07: qty 2

## 2023-10-07 MED ORDER — SODIUM CHLORIDE 0.45 % IV SOLN: INTRAVENOUS | Status: DC | PRN

## 2023-10-07 MED ORDER — POTASSIUM CHLORIDE 10 MEQ/50ML IV SOLN: 10.0000 meq | INTRAVENOUS | Status: AC

## 2023-10-07 MED ORDER — HEPARIN SODIUM (PORCINE) 1000 UNIT/ML IJ SOLN
INTRAMUSCULAR | Status: AC
  Filled 2023-10-07: qty 1

## 2023-10-07 MED ORDER — VECURONIUM BROMIDE 10 MG IV SOLR
INTRAVENOUS | Status: AC
  Filled 2023-10-07: qty 10

## 2023-10-07 MED ORDER — PANTOPRAZOLE SODIUM 40 MG PO TBEC
40.0000 mg | DELAYED_RELEASE_TABLET | Freq: Every day | ORAL | Status: DC
  Administered 2023-10-09 – 2023-10-12 (×4): 40 mg via ORAL
  Filled 2023-10-07 (×4): qty 1

## 2023-10-07 MED ORDER — BISACODYL 10 MG RE SUPP: 10.0000 mg | Freq: Every day | RECTAL | Status: DC

## 2023-10-07 MED ORDER — TRAMADOL HCL 50 MG PO TABS
50.0000 mg | ORAL_TABLET | ORAL | Status: DC | PRN
  Administered 2023-10-07 – 2023-10-09 (×5): 100 mg via ORAL
  Administered 2023-10-09: 50 mg via ORAL
  Administered 2023-10-09: 100 mg via ORAL
  Filled 2023-10-07 (×7): qty 2
  Filled 2023-10-07: qty 1

## 2023-10-07 MED ORDER — PROTAMINE SULFATE 10 MG/ML IV SOLN
INTRAVENOUS | Status: DC | PRN
  Administered 2023-10-07: 30 mg via INTRAVENOUS

## 2023-10-07 MED ORDER — MIDAZOLAM HCL 2 MG/2ML IJ SOLN
INTRAMUSCULAR | Status: AC
  Filled 2023-10-07: qty 2

## 2023-10-07 MED ORDER — KETAMINE HCL 10 MG/ML IJ SOLN
INTRAMUSCULAR | Status: DC | PRN
  Administered 2023-10-07: 20 mg via INTRAVENOUS
  Administered 2023-10-07: 30 mg via INTRAVENOUS
  Administered 2023-10-07: 50 mg via INTRAVENOUS

## 2023-10-07 MED ORDER — DEXMEDETOMIDINE HCL IN NACL 80 MCG/20ML IV SOLN
INTRAVENOUS | Status: AC
  Filled 2023-10-07: qty 20

## 2023-10-07 MED ORDER — BISACODYL 5 MG PO TBEC
10.0000 mg | DELAYED_RELEASE_TABLET | Freq: Every day | ORAL | Status: DC
  Administered 2023-10-08 – 2023-10-10 (×2): 10 mg via ORAL
  Filled 2023-10-07 (×3): qty 2

## 2023-10-07 MED ORDER — HEPARIN SODIUM (PORCINE) 1000 UNIT/ML IJ SOLN
INTRAMUSCULAR | Status: DC | PRN
  Administered 2023-10-07: 40000 [IU] via INTRAVENOUS

## 2023-10-07 MED ORDER — PANTOPRAZOLE SODIUM 40 MG IV SOLR
40.0000 mg | Freq: Every day | INTRAVENOUS | Status: AC
  Administered 2023-10-07 – 2023-10-08 (×2): 40 mg via INTRAVENOUS
  Filled 2023-10-07 (×2): qty 10

## 2023-10-07 MED ORDER — VANCOMYCIN HCL IN DEXTROSE 1-5 GM/200ML-% IV SOLN
1000.0000 mg | Freq: Once | INTRAVENOUS | Status: AC
  Administered 2023-10-07: 1000 mg via INTRAVENOUS
  Filled 2023-10-07: qty 200

## 2023-10-07 MED ORDER — ASPIRIN 81 MG PO CHEW
324.0000 mg | CHEWABLE_TABLET | Freq: Once | ORAL | Status: AC
  Administered 2023-10-07: 324 mg via ORAL
  Filled 2023-10-07: qty 4

## 2023-10-07 MED ORDER — MORPHINE SULFATE (PF) 2 MG/ML IV SOLN
1.0000 mg | INTRAVENOUS | Status: DC | PRN
  Administered 2023-10-07 – 2023-10-08 (×3): 2 mg via INTRAVENOUS
  Administered 2023-10-08 (×2): 4 mg via INTRAVENOUS
  Filled 2023-10-07: qty 2
  Filled 2023-10-07 (×2): qty 1
  Filled 2023-10-07 (×2): qty 2

## 2023-10-07 MED ORDER — SODIUM CHLORIDE 0.9 % IV SOLN: INTRAVENOUS | Status: DC

## 2023-10-07 MED ORDER — SODIUM CHLORIDE (PF) 0.9 % IJ SOLN
OROMUCOSAL | Status: DC | PRN
  Administered 2023-10-07 (×2): 4 mL via TOPICAL

## 2023-10-07 MED ORDER — VECURONIUM BROMIDE 10 MG IV SOLR
INTRAVENOUS | Status: DC | PRN
  Administered 2023-10-07 (×4): 5 mg via INTRAVENOUS

## 2023-10-07 MED ORDER — ALBUMIN HUMAN 5 % IV SOLN
250.0000 mL | INTRAVENOUS | Status: DC | PRN
Start: 2023-10-07 — End: 2023-10-12

## 2023-10-07 MED ORDER — METOCLOPRAMIDE HCL 5 MG/ML IJ SOLN
10.0000 mg | Freq: Four times a day (QID) | INTRAMUSCULAR | Status: AC
  Administered 2023-10-07 – 2023-10-09 (×6): 10 mg via INTRAVENOUS
  Filled 2023-10-07 (×6): qty 2

## 2023-10-07 MED ORDER — CHLORHEXIDINE GLUCONATE CLOTH 2 % EX PADS
6.0000 | MEDICATED_PAD | Freq: Every day | CUTANEOUS | Status: DC
  Administered 2023-10-07 – 2023-10-09 (×3): 6 via TOPICAL

## 2023-10-07 MED ORDER — ALBUMIN HUMAN 5 % IV SOLN: INTRAVENOUS | Status: DC | PRN

## 2023-10-07 MED ORDER — PROTAMINE SULFATE 10 MG/ML IV SOLN
INTRAVENOUS | Status: AC
  Filled 2023-10-07: qty 25

## 2023-10-07 MED ORDER — SODIUM CHLORIDE (PF) 0.9 % IJ SOLN
INTRAMUSCULAR | Status: AC
  Filled 2023-10-07: qty 10

## 2023-10-07 MED ORDER — DEXMEDETOMIDINE HCL IN NACL 400 MCG/100ML IV SOLN
0.0000 ug/kg/h | INTRAVENOUS | Status: DC
  Administered 2023-10-07: 1 ug/kg/h via INTRAVENOUS
  Filled 2023-10-07: qty 200

## 2023-10-07 MED ORDER — INSULIN REGULAR(HUMAN) IN NACL 100-0.9 UT/100ML-% IV SOLN
INTRAVENOUS | Status: DC | PRN
  Administered 2023-10-07: 8.5 [IU]/h via INTRAVENOUS

## 2023-10-07 MED ORDER — PLASMA-LYTE A IV SOLN
INTRAVENOUS | Status: DC | PRN
  Administered 2023-10-07: 1000 mL

## 2023-10-07 MED ORDER — CEFAZOLIN SODIUM 1 G IJ SOLR
INTRAMUSCULAR | Status: AC
  Filled 2023-10-07: qty 20

## 2023-10-07 MED ORDER — METOPROLOL TARTRATE 25 MG/10 ML ORAL SUSPENSION
12.5000 mg | Freq: Two times a day (BID) | ORAL | Status: DC
Start: 2023-10-07 — End: 2023-10-08

## 2023-10-07 MED ORDER — MIDAZOLAM HCL (PF) 5 MG/ML IJ SOLN
INTRAMUSCULAR | Status: DC | PRN
  Administered 2023-10-07: 5 mg via INTRAVENOUS
  Administered 2023-10-07: 2 mg via INTRAVENOUS
  Administered 2023-10-07: 5 mg via INTRAVENOUS
  Administered 2023-10-07 (×2): 2 mg via INTRAVENOUS
  Administered 2023-10-07 (×2): 3 mg via INTRAVENOUS

## 2023-10-07 MED ORDER — DEXTROSE 50 % IV SOLN: 0.0000 mL | INTRAVENOUS | Status: DC | PRN

## 2023-10-07 MED ORDER — DOCUSATE SODIUM 100 MG PO CAPS
200.0000 mg | ORAL_CAPSULE | Freq: Every day | ORAL | Status: DC
  Administered 2023-10-08 – 2023-10-11 (×3): 200 mg via ORAL
  Filled 2023-10-07 (×3): qty 2

## 2023-10-07 MED ORDER — SODIUM CHLORIDE 0.9% FLUSH: 3.0000 mL | INTRAVENOUS | Status: DC | PRN

## 2023-10-07 MED ORDER — ROCURONIUM BROMIDE 10 MG/ML (PF) SYRINGE
PREFILLED_SYRINGE | INTRAVENOUS | Status: AC
Start: 2023-10-07 — End: ?
  Filled 2023-10-07: qty 10

## 2023-10-07 MED ORDER — CEFAZOLIN SODIUM-DEXTROSE 2-4 GM/100ML-% IV SOLN
2.0000 g | Freq: Three times a day (TID) | INTRAVENOUS | Status: DC
  Administered 2023-10-07 – 2023-10-09 (×5): 2 g via INTRAVENOUS
  Filled 2023-10-07 (×4): qty 100

## 2023-10-07 MED ORDER — NITROGLYCERIN 0.2 MG/ML ON CALL CATH LAB
INTRAVENOUS | Status: DC | PRN
  Administered 2023-10-07: 60 ug via INTRAVENOUS
  Administered 2023-10-07 (×2): 40 ug via INTRAVENOUS
  Administered 2023-10-07: 60 ug via INTRAVENOUS
  Administered 2023-10-07 (×2): 80 ug via INTRAVENOUS
  Administered 2023-10-07: 40 ug via INTRAVENOUS
  Administered 2023-10-07: 60 ug via INTRAVENOUS
  Administered 2023-10-07: 80 ug via INTRAVENOUS

## 2023-10-07 MED ORDER — METOPROLOL TARTRATE 12.5 MG HALF TABLET
12.5000 mg | ORAL_TABLET | Freq: Two times a day (BID) | ORAL | Status: DC
Start: 2023-10-07 — End: 2023-10-08
  Administered 2023-10-08: 12.5 mg via ORAL
  Filled 2023-10-07: qty 1

## 2023-10-07 MED ORDER — FENTANYL CITRATE (PF) 250 MCG/5ML IJ SOLN
INTRAMUSCULAR | Status: DC | PRN
  Administered 2023-10-07 (×2): 250 ug via INTRAVENOUS
  Administered 2023-10-07: 100 ug via INTRAVENOUS
  Administered 2023-10-07 (×2): 50 ug via INTRAVENOUS
  Administered 2023-10-07: 100 ug via INTRAVENOUS
  Administered 2023-10-07: 50 ug via INTRAVENOUS
  Administered 2023-10-07 (×2): 200 ug via INTRAVENOUS

## 2023-10-07 MED ORDER — ALBUMIN HUMAN 5 % IV SOLN
25.0000 g | Freq: Once | INTRAVENOUS | Status: AC
  Administered 2023-10-07: 25 g via INTRAVENOUS

## 2023-10-07 MED ORDER — CEFAZOLIN SODIUM-DEXTROSE 1-4 GM/50ML-% IV SOLN
INTRAVENOUS | Status: DC | PRN
  Administered 2023-10-07 (×2): 2 g via INTRAVENOUS

## 2023-10-07 MED ORDER — PROPOFOL 1000 MG/100ML IV EMUL
INTRAVENOUS | Status: AC
Start: 2023-10-07 — End: ?
  Filled 2023-10-07: qty 100

## 2023-10-07 MED ORDER — ACETAMINOPHEN 160 MG/5ML PO SOLN
650.0000 mg | Freq: Once | ORAL | Status: AC
  Administered 2023-10-07: 650 mg
  Filled 2023-10-07: qty 20.3

## 2023-10-07 MED ORDER — SODIUM CHLORIDE 0.9% FLUSH
3.0000 mL | Freq: Two times a day (BID) | INTRAVENOUS | Status: DC
  Administered 2023-10-08 – 2023-10-11 (×9): 3 mL via INTRAVENOUS

## 2023-10-07 MED ORDER — PHENYLEPHRINE 80 MCG/ML (10ML) SYRINGE FOR IV PUSH (FOR BLOOD PRESSURE SUPPORT)
PREFILLED_SYRINGE | INTRAVENOUS | Status: AC
Start: 2023-10-07 — End: ?
  Filled 2023-10-07: qty 10

## 2023-10-07 MED ORDER — MIDAZOLAM HCL (PF) 10 MG/2ML IJ SOLN
INTRAMUSCULAR | Status: AC
  Filled 2023-10-07: qty 2

## 2023-10-07 MED ORDER — CLEVIDIPINE BUTYRATE 0.5 MG/ML IV EMUL
INTRAVENOUS | Status: DC | PRN
  Administered 2023-10-07: 2 mg/h via INTRAVENOUS

## 2023-10-07 MED ORDER — ONDANSETRON HCL 4 MG/2ML IJ SOLN
4.0000 mg | Freq: Four times a day (QID) | INTRAMUSCULAR | Status: DC | PRN
  Filled 2023-10-07: qty 2

## 2023-10-07 MED ORDER — METOPROLOL TARTRATE 5 MG/5ML IV SOLN
2.5000 mg | INTRAVENOUS | Status: DC | PRN
  Administered 2023-10-08: 2.5 mg via INTRAVENOUS
  Filled 2023-10-07: qty 5

## 2023-10-07 MED ORDER — ACETAMINOPHEN 160 MG/5ML PO SOLN
1000.0000 mg | Freq: Four times a day (QID) | ORAL | Status: DC
  Administered 2023-10-07: 1000 mg
  Filled 2023-10-07: qty 40.6

## 2023-10-07 MED ORDER — PROPOFOL 10 MG/ML IV BOLUS
INTRAVENOUS | Status: DC | PRN
  Administered 2023-10-07: 100 mg via INTRAVENOUS
  Administered 2023-10-07: 200 mg via INTRAVENOUS

## 2023-10-07 MED ORDER — METHADONE HCL IV SYRINGE 10 MG/ML FOR CABG
0.4000 mg/kg | Freq: Once | INTRAMUSCULAR | Status: AC
  Administered 2023-10-07: 32 mg via INTRAVENOUS
  Filled 2023-10-07: qty 3.2

## 2023-10-07 MED ORDER — ASPIRIN 325 MG PO TBEC
325.0000 mg | DELAYED_RELEASE_TABLET | Freq: Every day | ORAL | Status: DC
  Administered 2023-10-08 – 2023-10-09 (×2): 325 mg via ORAL
  Filled 2023-10-07 (×2): qty 1

## 2023-10-07 MED ORDER — LACTATED RINGERS IV SOLN
INTRAVENOUS | Status: DC
Start: 2023-10-07 — End: 2023-10-08

## 2023-10-07 MED ORDER — TRANEXAMIC ACID 1000 MG/10ML IV SOLN
INTRAVENOUS | Status: DC | PRN
  Administered 2023-10-07: 1.5 mg/kg/h via INTRAVENOUS

## 2023-10-07 MED ORDER — HEPARIN SODIUM (PORCINE) 1000 UNIT/ML IJ SOLN
INTRAMUSCULAR | Status: AC
Start: 2023-10-07 — End: ?
  Filled 2023-10-07: qty 10

## 2023-10-07 MED ORDER — DEXMEDETOMIDINE HCL IN NACL 80 MCG/20ML IV SOLN
INTRAVENOUS | Status: DC | PRN
  Administered 2023-10-07: 60 ug via INTRAVENOUS

## 2023-10-07 MED ORDER — SODIUM BICARBONATE 8.4 % IV SOLN
50.0000 meq | Freq: Once | INTRAVENOUS | Status: AC
Start: 2023-10-07 — End: 2023-10-07
  Administered 2023-10-07: 50 meq via INTRAVENOUS

## 2023-10-07 MED ORDER — SODIUM CHLORIDE (PF) 0.9 % IJ SOLN
INTRAMUSCULAR | Status: AC
Start: 2023-10-07 — End: ?
  Filled 2023-10-07: qty 10

## 2023-10-07 SURGICAL SUPPLY — 85 items
BAG DECANTER FOR FLEXI CONT (MISCELLANEOUS) ×2 IMPLANT
BLADE CLIPPER SURG (BLADE) ×2 IMPLANT
BLADE STERNUM SYSTEM 6 (BLADE) ×2 IMPLANT
BLADE SURG 11 STRL SS (BLADE) IMPLANT
BNDG ELASTIC 4INX 5YD STR LF (GAUZE/BANDAGES/DRESSINGS) IMPLANT
BNDG ELASTIC 4X5.8 VLCR STR LF (GAUZE/BANDAGES/DRESSINGS) ×2 IMPLANT
BNDG ELASTIC 6INX 5YD STR LF (GAUZE/BANDAGES/DRESSINGS) IMPLANT
BNDG ELASTIC 6X5.8 VLCR STR LF (GAUZE/BANDAGES/DRESSINGS) ×2 IMPLANT
BNDG GAUZE DERMACEA FLUFF 4 (GAUZE/BANDAGES/DRESSINGS) ×2 IMPLANT
CABLE SURGICAL S-101-97-12 (CABLE) ×2 IMPLANT
CANISTER SUCT 3000ML PPV (MISCELLANEOUS) ×2 IMPLANT
CANNULA MC2 2 STG 29/37 NON-V (CANNULA) ×2 IMPLANT
CANNULA NON VENT 20FR 12 (CANNULA) ×2 IMPLANT
CANNULA NON VENT 22FR 12 (CANNULA) IMPLANT
CANNULA VESSEL 3MM BLUNT TIP (CANNULA) IMPLANT
CATH ROBINSON RED A/P 18FR (CATHETERS) ×4 IMPLANT
CLIP RETRACTION 3.0MM CORONARY (MISCELLANEOUS) IMPLANT
CLIP TI MEDIUM 24 (CLIP) IMPLANT
CLIP TI WIDE RED SMALL 24 (CLIP) IMPLANT
CONN ST 1/2X1/2 BEN (MISCELLANEOUS) ×2 IMPLANT
CONNECTOR BLAKE 2:1 CARIO BLK (MISCELLANEOUS) ×2 IMPLANT
CONTAINER PROTECT SURGISLUSH (MISCELLANEOUS) ×4 IMPLANT
DERMABOND ADVANCED .7 DNX12 (GAUZE/BANDAGES/DRESSINGS) IMPLANT
DRAIN CHANNEL 19F RND (DRAIN) ×6 IMPLANT
DRAIN CONNECTOR BLAKE 1:1 (MISCELLANEOUS) ×2 IMPLANT
DRAPE INCISE IOBAN 66X45 STRL (DRAPES) IMPLANT
DRAPE SRG 135X102X78XABS (DRAPES) ×2 IMPLANT
DRAPE WARM FLUID 44X44 (DRAPES) ×2 IMPLANT
DRSG AQUACEL AG ADV 3.5X10 (GAUZE/BANDAGES/DRESSINGS) ×2 IMPLANT
ELECT BLADE 4.0 EZ CLEAN MEGAD (MISCELLANEOUS) ×2
ELECT REM PT RETURN 9FT ADLT (ELECTROSURGICAL) ×4
ELECTRODE BLDE 4.0 EZ CLN MEGD (MISCELLANEOUS) ×2 IMPLANT
ELECTRODE REM PT RTRN 9FT ADLT (ELECTROSURGICAL) ×4 IMPLANT
FELT TEFLON 1X6 (MISCELLANEOUS) ×4 IMPLANT
GAUZE 4X4 16PLY ~~LOC~~+RFID DBL (SPONGE) ×2 IMPLANT
GAUZE SPONGE 4X4 12PLY STRL (GAUZE/BANDAGES/DRESSINGS) ×4 IMPLANT
GLOVE BIO SURGEON STRL SZ 6 (GLOVE) IMPLANT
GLOVE BIO SURGEON STRL SZ 6.5 (GLOVE) IMPLANT
GLOVE BIO SURGEON STRL SZ7 (GLOVE) ×4 IMPLANT
GLOVE BIOGEL M STRL SZ7.5 (GLOVE) ×4 IMPLANT
GLOVE INDICATOR 7.5 STRL GRN (GLOVE) IMPLANT
GOWN STRL REUS W/ TWL LRG LVL3 (GOWN DISPOSABLE) ×8 IMPLANT
GOWN STRL REUS W/ TWL XL LVL3 (GOWN DISPOSABLE) ×4 IMPLANT
HEMOSTAT POWDER SURGIFOAM 1G (HEMOSTASIS) ×4 IMPLANT
INSERT SUTURE HOLDER (MISCELLANEOUS) ×2 IMPLANT
KIT BASIN OR (CUSTOM PROCEDURE TRAY) ×2 IMPLANT
KIT SUCTION CATH 14FR (SUCTIONS) ×2 IMPLANT
KIT TURNOVER KIT B (KITS) ×2 IMPLANT
KIT VASOVIEW HEMOPRO 2 VH 4000 (KITS) ×2 IMPLANT
LEAD PACING MYOCARDI (MISCELLANEOUS) ×2 IMPLANT
MARKER GRAFT CORONARY BYPASS (MISCELLANEOUS) ×6 IMPLANT
NS IRRIG 1000ML POUR BTL (IV SOLUTION) ×10 IMPLANT
PACK E OPEN HEART (SUTURE) ×2 IMPLANT
PACK OPEN HEART (CUSTOM PROCEDURE TRAY) ×2 IMPLANT
PAD ARMBOARD 7.5X6 YLW CONV (MISCELLANEOUS) ×4 IMPLANT
PAD ELECT DEFIB RADIOL ZOLL (MISCELLANEOUS) ×2 IMPLANT
PENCIL BUTTON HOLSTER BLD 10FT (ELECTRODE) ×2 IMPLANT
POSITIONER HEAD DONUT 9IN (MISCELLANEOUS) ×2 IMPLANT
PUNCH AORTIC ROTATE 4.0MM (MISCELLANEOUS) ×2 IMPLANT
SET MPS 3-ND DEL (MISCELLANEOUS) IMPLANT
SPONGE T-LAP 18X18 ~~LOC~~+RFID (SPONGE) ×8 IMPLANT
STOPCOCK 4 WAY LG BORE MALE ST (IV SETS) IMPLANT
SUPPORT HEART JANKE-BARRON (MISCELLANEOUS) ×2 IMPLANT
SUT BONE WAX W31G (SUTURE) ×2 IMPLANT
SUT ETHIBOND X763 2 0 SH 1 (SUTURE) ×4 IMPLANT
SUT MNCRL AB 3-0 PS2 18 (SUTURE) ×4 IMPLANT
SUT MNCRL AB 4-0 PS2 18 (SUTURE) IMPLANT
SUT PDS AB 1 CTX 36 (SUTURE) ×4 IMPLANT
SUT PROLENE 4 0 SH DA (SUTURE) ×2 IMPLANT
SUT PROLENE 4-0 RB1 .5 CRCL 36 (SUTURE) IMPLANT
SUT PROLENE 5 0 C 1 36 (SUTURE) ×6 IMPLANT
SUT PROLENE 7 0 BV 1 (SUTURE) IMPLANT
SUT PROLENE 7 0 BV1 MDA (SUTURE) ×2 IMPLANT
SUT STEEL 6MS V (SUTURE) ×4 IMPLANT
SUT STEEL STERNAL CCS#1 18IN (SUTURE) IMPLANT
SUT VIC AB 2-0 CT1 TAPERPNT 27 (SUTURE) IMPLANT
SYSTEM SAHARA CHEST DRAIN ATS (WOUND CARE) ×2 IMPLANT
TAPE CLOTH SURG 4X10 WHT LF (GAUZE/BANDAGES/DRESSINGS) IMPLANT
TAPE PAPER 2X10 WHT MICROPORE (GAUZE/BANDAGES/DRESSINGS) IMPLANT
TOWEL GREEN STERILE (TOWEL DISPOSABLE) ×2 IMPLANT
TOWEL GREEN STERILE FF (TOWEL DISPOSABLE) ×2 IMPLANT
TRAY FOLEY SLVR 16FR TEMP STAT (SET/KITS/TRAYS/PACK) ×2 IMPLANT
TUBING LAP HI FLOW INSUFFLATIO (TUBING) ×2 IMPLANT
UNDERPAD 30X36 HEAVY ABSORB (UNDERPADS AND DIAPERS) ×2 IMPLANT
WATER STERILE IRR 1000ML POUR (IV SOLUTION) ×4 IMPLANT

## 2023-10-07 NOTE — Anesthesia Preprocedure Evaluation (Signed)
Anesthesia Evaluation  Patient identified by MRN, date of birth, ID band Patient awake    Reviewed: Allergy & Precautions, H&P , NPO status , Patient's Chart, lab work & pertinent test results  Airway Mallampati: II   Neck ROM: full    Dental   Pulmonary Current Smoker   breath sounds clear to auscultation       Cardiovascular hypertension, + CAD   Rhythm:regular Rate:Normal  TTE (10/03/23): EF 55%, no RWMA. Normal valve function.   Neuro/Psych  PSYCHIATRIC DISORDERS Anxiety Depression       GI/Hepatic ,GERD  ,,  Endo/Other  diabetes, Type 2    Renal/GU      Musculoskeletal   Abdominal   Peds  Hematology   Anesthesia Other Findings   Reproductive/Obstetrics                             Anesthesia Physical Anesthesia Plan  ASA: 3  Anesthesia Plan: General   Post-op Pain Management:    Induction: Intravenous  PONV Risk Score and Plan: 1 and Ondansetron, Dexamethasone, Midazolam and Treatment may vary due to age or medical condition  Airway Management Planned: Oral ETT  Additional Equipment: Arterial line, CVP, TEE and Ultrasound Guidance Line Placement  Intra-op Plan:   Post-operative Plan: Post-operative intubation/ventilation  Informed Consent: I have reviewed the patients History and Physical, chart, labs and discussed the procedure including the risks, benefits and alternatives for the proposed anesthesia with the patient or authorized representative who has indicated his/her understanding and acceptance.     Dental advisory given  Plan Discussed with: CRNA, Anesthesiologist and Surgeon  Anesthesia Plan Comments:        Anesthesia Quick Evaluation

## 2023-10-07 NOTE — Progress Notes (Signed)
   10/07/23 2224  Adult Ventilator Settings  Vent Mode PSV;CPAP  Pressure Support 10 cmH20  PEEP 5 cmH20   Placed pt on wean PS per md order RN aware

## 2023-10-07 NOTE — Progress Notes (Signed)
1st call to 2 heart charge nurse @[phone] 

## 2023-10-07 NOTE — Anesthesia Procedure Notes (Signed)
Central Venous Catheter Insertion Performed by: Achille Rich, MD, anesthesiologist Start/End11/20/2024 8:02 AM, 10/07/2023 8:15 AM Patient location: Pre-op. Preanesthetic checklist: patient identified, IV checked, site marked, risks and benefits discussed, surgical consent, monitors and equipment checked, pre-op evaluation, timeout performed and anesthesia consent Lidocaine 1% used for infiltration and patient sedated Hand hygiene performed  and maximum sterile barriers used  Catheter size: 8.5 Fr Sheath introducer Procedure performed using ultrasound guided technique. Ultrasound Notes:anatomy identified, needle tip was noted to be adjacent to the nerve/plexus identified, no ultrasound evidence of intravascular and/or intraneural injection and image(s) printed for medical record Attempts: 1 Following insertion, line sutured and dressing applied. Post procedure assessment: blood return through all ports, free fluid flow and no air  Patient tolerated the procedure well with no immediate complications.

## 2023-10-07 NOTE — Consult Note (Signed)
NAME:  Edwin Ford, MRN:  [id number], DOB:  [birth year], LOS: 4 ADMISSION DATE:  10/03/2023, CONSULTATION DATE:  10/07/23 REFERRING MD:  Dr. Cliffton Asters, CHIEF COMPLAINT:  postop   History of Present Illness:  Pt encephalopathic, therefore HPI obtained from EMR.  33 yoM with PMH of tobacco abuse, HTN, HLD, DMT2, polysubstance abuse (cocaine, marijuana, asthma, obesity, GERD and depression who presented 11/16 with chest pain, SOB, and diaphoresis admitted to Cardiology with NSTEMI treated with ASA, heparin IV, and NTG gtt.  EKG without ischemic changes but noted new RBBB with STD in inferior leads.  Denied recent cocaine use in several months, no UDS on admit.  Underwent LHC on 11/16 showing severe multivessel CAD.  Echo showed LVEF 50-55%, no regional wall motion abnormalities, G2DD.  TCTS consulted for surgical management.  Underwent CABG on 11/20 by Dr. Cliffton Asters.    EBL - not yet documented  Bypass time -- *  Pertinent  Medical History  Tobacco abuse, HTN, HLD, DMT2, polysubstance abuse (cocaine, marijuana), asthma, GERD, depression, obesity  Significant Hospital Events: Including procedures, antibiotic start and stop dates in addition to other pertinent events   11/16 admitted NSTEMI, LHC 11/20 CABG  Interim History / Subjective:  Per hpi above   Objective   Blood pressure 132/88, pulse 82, temperature 98.3 F (36.8 C), temperature source Oral, resp. rate 14, height 6\' 1"  (1.854 m), weight 113.4 kg, SpO2 98%.        Intake/Output Summary (Last 24 hours) at 10/07/2023 1311 Last data filed at 10/07/2023 1200 Gross per 24 hour  Intake 1181.95 ml  Output 1200 ml  Net -18.05 ml   Filed Weights   10/03/23 0932 10/03/23 1649 10/04/23 0039  Weight: 113.4 kg 113.4 kg 113.4 kg    Examination: General: middle aged obese male, intubated post op on mv  HENT: ett in place  Lungs: BL vented breaths  Cardiovascular: RRR s1 s2  Abdomen: soft nt nd  Extremities: no significant  edema  Neuro: sedated, post op  GU: foley in place  Resolved Hospital Problem list    Assessment & Plan:   Multivessel CAD s/p CABG x4 NSTEMI Post bypass vasoplegia Acute postoperative respiratory insufficiency Expected post-operative ABLA Expected post-operative consumptive thrombocytopenia  HTN HLD P:  - post-op management per TCTS - tele monitoring/ pacing prn - rapid wean per TCTS protocol - VAP/ PPI - CXR/ ABG, CBC, BMET, coags now - con't weaning pressors as able to maintain MAP >70-90. Albumin boluses prn for low SV.  - mediastinal drains per TCTS - multimodal pain control per protocol- oxycodone, tramadol, morphine with bowel regimen - ASA, statin to resume next day  - metoprolol BID once off pressors - complete post-op antibiotics - monitor electrolytes, replete PRN  DMT2 - A1c 10.8 P:  - insulin gtt as above - we will transition off once appropriate  Tobacco abuse Asthma - no prior PFTs or use of inhalers PTA - prn albuterol - ongoing smoking cessation   Obesity - outpt lifestyle modifications  Social issues - medical non-compliance due to financial issues, not taking any medications.  Lives out of motel, otherwise homeless.  TOC consulted  Best Practice (right click and "Reselect all SmartList Selections" daily)   Diet/type: NPO DVT prophylaxis: SCD Pressure ulcer(s): n/a GI prophylaxis: PPI Lines: Central line and Arterial Line Foley:  Yes, and it is still needed Code Status:  full code Last date of multidisciplinary goals of care discussion [per primary team]  Labs  CBC: Recent Labs  Lab 10/03/23 0840 10/04/23 0441 10/05/23 0727 10/06/23 0554 10/07/23 0535  WBC 7.3 11.1* 9.2 7.9 9.2  NEUTROABS 4.2  --   --   --   --   HGB 16.3 13.2 13.2 11.7* 14.3  HCT 44.7 36.6* 36.5* 32.5* 38.9*  MCV 84.3 83.9 85.7 85.5 85.1  PLT 188 154 146* 142* 173    Basic Metabolic Panel: Recent Labs  Lab 10/03/23 0840 10/05/23 0727 10/06/23 0554  10/07/23 0535  NA 135 134* 134* 135  K 3.8 3.6 3.7 3.8  CL 100 103 105 104  CO2 25 21* 22 20*  GLUCOSE 231* 156* 171* 136*  BUN 9 10 10 9   CREATININE 0.92 1.03 0.98 0.93  CALCIUM 9.4 8.6* 8.3* 8.9   GFR: Estimated Creatinine Clearance: 133.8 mL/min (by C-G formula based on SCr of 0.93 mg/dL). Recent Labs  Lab 10/04/23 0441 10/05/23 0727 10/06/23 0554 10/07/23 0535  WBC 11.1* 9.2 7.9 9.2    Liver Function Tests: Recent Labs  Lab 10/03/23 0840  AST 21  ALT 18  ALKPHOS 76  BILITOT 0.6  PROT 7.5  ALBUMIN 3.9   No results for input(s): "LIPASE", "AMYLASE" in the last 168 hours. No results for input(s): "AMMONIA" in the last 168 hours.  ABG    Component Value Date/Time   PHART 7.45 10/06/2023 1511   PCO2ART 34 10/06/2023 1511   PO2ART 144 (H) 10/06/2023 1511   HCO3 23.6 10/06/2023 1511   O2SAT 100 10/06/2023 1511     Coagulation Profile: Recent Labs  Lab 10/06/23 1604  INR 1.0    Cardiac Enzymes: No results for input(s): "CKTOTAL", "CKMB", "CKMBINDEX", "TROPONINI" in the last 168 hours.  HbA1C: HbA1c, POC (controlled diabetic range)  Date/Time Value Ref Range Status  04/11/2022 09:15 AM 11.7 (A) 0.0 - 7.0 % Final   Hgb A1c MFr Bld  Date/Time Value Ref Range Status  10/03/2023 06:20 PM 10.8 (H) 4.8 - 5.6 % Final    Comment:    (NOTE) Pre diabetes:          5.7%-6.4%  Diabetes:              >6.4%  Glycemic control for   <7.0% adults with diabetes   02/07/2021 08:43 AM 6.7 (H) 4.8 - 5.6 % Final    Comment:    (NOTE) Pre diabetes:          5.7%-6.4%  Diabetes:              >6.4%  Glycemic control for   <7.0% adults with diabetes     CBG: Recent Labs  Lab 10/06/23 1201 10/06/23 1630 10/06/23 2133 10/07/23 0536 10/07/23 1156  GLUCAP 250* 107* 118* 135* 154*    Review of Systems:   unable  Past Medical History:  He,  has a past medical history of Allergies, Anxiety, Asthma, Depression, Essential hypertension, GERD  (gastroesophageal reflux disease), Hyperlipidemia, and Type 2 diabetes mellitus (HCC).   Surgical History:   Past Surgical History:  Procedure Laterality Date   LEFT HEART CATH AND CORONARY ANGIOGRAPHY N/A 10/03/2023   Procedure: LEFT HEART CATH AND CORONARY ANGIOGRAPHY;  Surgeon: Marykay Lex, MD;  Location: [facility] INVASIVE CV LAB;  Service: Cardiovascular;  Laterality: N/A;   TOOTH EXTRACTION N/A      Social History:   reports that he has been smoking cigarettes. He started smoking about 28 years ago. He has a 28.9 pack-year smoking history. He has quit using smokeless tobacco.  His smokeless tobacco use included chew. He reports current drug use. Frequency: 7.00 times per week. Drug: Marijuana. He reports that he does not drink alcohol.   Family History:  His family history includes CAD in his mother; Depression in his father; Diabetes Mellitus II in his father and mother; High Cholesterol in his father and mother; Hypertension in his father and mother; Kidney disease in his father.   Allergies Allergies  Allergen Reactions   Bee Pollen     Other reaction(s): Other (See Comments) Sinus drainage Other reaction(s): Other (See Comments) Other reaction(s): Other (See Comments) Sinus drainage Sinus drainage    Pollen Extract Other (See Comments)    Sinus drainage     Home Medications  Prior to Admission medications   Medication Sig Start Date End Date Taking? Authorizing Provider  albuterol (PROVENTIL HFA) 108 (90 Base) MCG/ACT inhaler INHALE 2 PUFFS BY MOUTH EVERY 6 HOURS AS NEEDED FOR COUGHING, WHEEZING, OR SHORTNESS OF BREATH Patient taking differently: Inhale 2 puffs into the lungs every 6 (six) hours as needed for shortness of breath. INHALE 2 PUFFS BY MOUTH EVERY 6 HOURS AS NEEDED FOR COUGHING, WHEEZING, OR SHORTNESS OF BREATH 05/23/21  Yes Jacquelin Hawking, PA-C  alprazolam Prudy Feeler) 2 MG tablet Take 2 mg by mouth 3 (three) times daily as needed for sleep.   Yes [provider]  metFORMIN (GLUCOPHAGE-XR) 500 MG 24 hr tablet STARTING 6/5 - 1000 MG AM AND 500 MG PM STARTING 6/12 - 1000 MG AM AND 1000 MG PM Patient taking differently: Take 1,000 mg by mouth 2 (two) times daily with a meal. Starting 6/5 - 1000 mg am and 500 mg pm Starting 6/12 - 1000 mg am and 1000 mg pm 02/02/23  Yes Sowell, Apolinar Junes, MD  amLODipine (NORVASC) 10 MG tablet TAKE 1 Tablet BY MOUTH ONCE EVERY DAY Patient not taking: Reported on 10/03/2023 05/30/23   Long, Arlyss Repress, MD  atorvastatin (LIPITOR) 20 MG tablet TAKE 1 Tablet BY MOUTH ONCE EVERY DAY Patient not taking: Reported on 10/03/2023 05/30/23   Maia Plan, MD  Blood Pressure Monitoring (BLOOD PRESSURE CUFF) MISC Take BP 3-4 times weekly. Patient not taking: Reported on 10/03/2023 07/15/22   Carlisle Beers, FNP  insulin glargine (LANTUS SOLOSTAR) 100 UNIT/ML Solostar Pen Inject 30 Units into the skin daily. Patient not taking: Reported on 10/03/2023 08/19/22   Bess Kinds, MD  lisinopril (ZESTRIL) 40 MG tablet TAKE 1 Tablet BY MOUTH ONCE EVERY DAY Patient not taking: Reported on 10/03/2023 05/30/23   Long, Arlyss Repress, MD  metoprolol tartrate (LOPRESSOR) 100 MG tablet Take 1 tablet (100 mg total) by mouth 2 (two) times daily. Patient not taking: Reported on 10/03/2023 05/30/23 08/28/23  Maia Plan, MD  omeprazole (PRILOSEC) 20 MG capsule Take 1 capsule (20 mg total) by mouth daily. Patient not taking: Reported on 10/03/2023 05/20/23   Bess Kinds, MD  sertraline (ZOLOFT) 100 MG tablet Take 2 tablets (200 mg total) by mouth daily. Patient not taking: Reported on 10/03/2023 03/12/17   Jacquelin Hawking, PA-C  sitaGLIPtin (JANUVIA) 100 MG tablet TAKE 1 Tablet BY MOUTH ONCE EVERY DAY Patient not taking: Reported on 10/03/2023 05/23/21   Jacquelin Hawking, PA-C  traZODone (DESYREL) 100 MG tablet Take 50-200 mg by mouth at bedtime. Patient not taking: Reported on 10/03/2023    [provider]     This patient is  critically ill with multiple organ system failure; which, requires frequent high complexity decision making, assessment, support, evaluation,  and titration of therapies. This was completed through the application of advanced monitoring technologies and extensive interpretation of multiple databases. During this encounter critical care time was devoted to patient care services described in this note for 32 minutes.  Josephine Igo, DO [facility] 10/07/2023 6:35 PM

## 2023-10-07 NOTE — Anesthesia Procedure Notes (Signed)
Arterial Line Insertion Start/End11/20/2024 8:05 AM, 10/07/2023 8:10 AM Performed by: Flonnie Hailstone, CRNA, CRNA  Preanesthetic checklist: patient identified, IV checked, site marked, risks and benefits discussed, surgical consent, monitors and equipment checked, pre-op evaluation, timeout performed and anesthesia consent Lidocaine 1% used for infiltration Left, radial was placed Catheter size: 20 G Hand hygiene performed  and maximum sterile barriers used   Attempts: 1 Procedure performed without using ultrasound guided technique. Following insertion, dressing applied and Biopatch. Post procedure assessment: normal  Patient tolerated the procedure well with no immediate complications.

## 2023-10-07 NOTE — Progress Notes (Signed)
VC 2.1L NIF -30 good pt effort

## 2023-10-07 NOTE — Progress Notes (Signed)
   10/07/23 2157  Adult Ventilator Settings  Vent Type Servo i  Humidity HME  Vent Mode SIMV;PRVC;PSV  Set Rate (S)  4 bmp  FiO2 (%) (S)  40 %   Placed pt on wean mode per md order

## 2023-10-07 NOTE — Progress Notes (Signed)
     [address]       [place] [address]             [phone]       No events  Vitals:   10/07/23 0435 10/07/23 0700  BP: 130/68 138/67  Pulse: 68   Resp: 16 16  Temp: 98.8 F (37.1 C)   SpO2: 100%    Alert NAD Sinus EWOB  OR today for CABG  Edwin Ford O Tobiah Celestine

## 2023-10-07 NOTE — Progress Notes (Signed)
Pt transported to short stay for CABG via stretcher. Report called to Beola Cord, CRNA. Tele box removed in short stay and returned to 6 east floor.

## 2023-10-07 NOTE — Transfer of Care (Signed)
Immediate Anesthesia Transfer of Care Note  Patient: Edwin Ford  Procedure(s) Performed: CORONARY ARTERY BYPASS GRAFTING X four, USING LEFT INTERNAL MAMMARY ARTERY AND ENDOSCOPICALLY HARVESTED RIGHT SAPHENOUS VEIN GRAFT (Chest) TRANSESOPHAGEAL ECHOCARDIOGRAM (TEE)  Patient Location: ICU  Anesthesia Type:General  Level of Consciousness: Patient remains intubated per anesthesia plan  Airway & Oxygen Therapy: Patient remains intubated per anesthesia plan and Patient placed on Ventilator (see vital sign flow sheet for setting)  Post-op Assessment: Report given to RN and Post -op Vital signs reviewed and stable  Post vital signs: Reviewed and stable  Last Vitals:  Vitals Value Taken Time  BP 110/55   Temp 36.8 C 10/07/23 1740  Pulse 69 10/07/23 1740  Resp 16 10/07/23 1740  SpO2 100 % 10/07/23 1740  Vitals shown include unfiled device data.  Last Pain:  Vitals:   10/07/23 1100  TempSrc: Oral  PainSc:       Patients Stated Pain Goal: 0 (10/06/23 2128)  Complications: There were no known notable events for this encounter.

## 2023-10-07 NOTE — Op Note (Signed)
[address]       [place] [address]             [phone]                                          10/07/2023 Patient:  [name] Pre-Op Dx: NSTEMI 3V CAD Obesity HTN HLP Hx of polysubstance abuse   Post-op Dx:  same Procedure: CABG X 4.  LIMA LAD, RSVG PDA, OM, Ramus   Endoscopic greater saphenous vein harvest on the right   Surgeon and Role:      * Muriel Hannold, Eliezer Lofts, MD - Primary    * E. Barrett , PA-C - assisting An experienced assistant was required given the complexity of this surgery and the standard of surgical care. The assistant was needed for exposure, dissection, suctioning, retraction of delicate tissues and sutures, instrument exchange and for overall help during this procedure.    Anesthesia  general EBL:  Blood Administration: none Xclamp Time:  60 min Pump Time:   Drains: 28 F blake drain: L, mediastinal  Wires: ventricular Counts: correct   Indications: Ren Gladys is a 42 yo obese male with history of HTN, DM, polysubstance abuse (cocaine, marijuana), nicotine abuse, and depression.  The patient presented to the ED at [facility] on 11/16 with complaints of chest pain for 30 minutes duration.    Findings: Good LIMA and vein.  Calcified vessels.    Operative Technique: All invasive lines were placed in pre-op holding.  After the risks, benefits and alternatives were thoroughly discussed, the patient was brought to the operative theatre.  Anesthesia was induced, and the patient was prepped and draped in normal sterile fashion.  An appropriate surgical pause was performed, and pre-operative antibiotics were dosed accordingly.  We began with simultaneous incisions along the right leg for harvesting of the greater saphenous vein and the chest for the sternotomy.  In regards to the sternotomy, this was carried down with bovie cautery, and the sternum was divided with a reciprocating saw.  Meticulous  hemostasis was obtained.  The left internal thoracic artery was exposed and harvested in in pedicled fashion.  The patient was systemically heparinized, and the artery was divided distally, and placed in a papaverine sponge.    The sternal elevator was removed, and a retractor was placed.  The pericardium was divided in the midline and fashioned into a cradle with pericardial stitches.   After we confirmed an appropriate ACT, the ascending aorta was cannulated in standard fashion.  The right atrial appendage was used for venous cannulation site.  Cardiopulmonary bypass was initiated, and the heart retractor was placed. The cross clamp was applied, and a dose of anterograde cardioplegia was given with good arrest of the heart.  We moved to the posterior wall of the heart, and found a good target on the PDA.  An arteriotomy was made, and the vein graft was anastomosed to it in an end to side fashion.  Next we exposed the lateral wall, and found a good target on the OM.  An end to side anastomosis with the vein graft was then created.  Next, we exposed the anterior wall of the heart and identified a good target on ramus.   An arteriotomy was created.  The vein was anastomosed in an end to  side fashion.  Finally, we exposed a good target on the  LAD, and fashioned an end to side anastomosis between it and the LITA.  We began to re-warm, and a re-animation dose of cardioplegia was given.  The heart was de-aired, and the cross clamp was removed.  Meticulous hemostasis was obtained.    A partial occludding clamp was then placed on the ascending aorta, and we created an end to side anastomosis between it and the proximal vein grafts.  Rings were placed on the proximal anastomosis.  Hemostasis was obtained, and we separated from cardiopulmonary bypass without event.  The heparin was reversed with protamine.  Chest tubes and wires were placed, and the sternum was re-approximated with sternal wires.  The soft tissue and  skin were re-approximated wth absorbable suture.    The patient tolerated the procedure without any immediate complications, and was transferred to the ICU in guarded condition.  Dontrey Snellgrove Keane Scrape

## 2023-10-07 NOTE — Progress Notes (Signed)
   10/07/23 2345  Vent Select  Vent end date 10/07/23  Vent end time 2345   Extubation Procedure Note  Patient Details:   Name: LANXTON TUMLINSON DOB: 01-Nov-1981 MRN: [id number]   Airway Documentation:  Airway 8 mm (Active)  Secured at (cm) 24 cm 10/07/23 2320  Measured From Lips 10/07/23 2320  Secured Location Right 10/07/23 1959  Secured By [employer] 10/07/23 2320  Bite Block Yes 10/07/23 1959  Tube Holder Repositioned Yes 10/07/23 2320  Cuff Pressure (cm H2O) Clear OR 27-39 CmH2O 10/07/23 1959  Site Condition Dry 10/07/23 1959   Vent end date: (P) 10/07/23 Vent end time: (P) 2345   Evaluation  O2 sats: stable throughout Complications: No apparent complications Patient did tolerate procedure well. Pt was extubated to N/C 2L    Demya Scruggs A Javona Bergevin 10/07/2023, 11:45 PM

## 2023-10-07 NOTE — Anesthesia Procedure Notes (Signed)
Procedure Name: Intubation Date/Time: 10/07/2023 12:47 PM  Performed by: Flonnie Hailstone, CRNAPre-anesthesia Checklist: Patient identified, Emergency Drugs available, Suction available and Patient being monitored Patient Re-evaluated:Patient Re-evaluated prior to induction Oxygen Delivery Method: Circle system utilized Preoxygenation: Pre-oxygenation with 100% oxygen Induction Type: IV induction Ventilation: Two handed mask ventilation required and Oral airway inserted - appropriate to patient size Laryngoscope Size: Glidescope and 4 Grade View: Grade I Tube type: Oral Tube size: 8.0 mm Number of attempts: 1 Airway Equipment and Method: Stylet, Oral airway and Video-laryngoscopy Placement Confirmation: ETT inserted through vocal cords under direct vision, positive ETCO2 and breath sounds checked- equal and bilateral Tube secured with: Tape Dental Injury: Teeth and Oropharynx as per pre-operative assessment  Comments: First attempt grade 4 view with mac 4

## 2023-10-07 NOTE — Brief Op Note (Signed)
10/03/2023 - 10/07/2023  8:01 AM  PATIENT:  Edwin Ford  42 y.o. male  PRE-OPERATIVE DIAGNOSIS:  CORONARY ARTERY DISEASE  POST-OPERATIVE DIAGNOSIS:  CORONARY ARTERY DISEASE  PROCEDURE:  Procedure(s):  CORONARY ARTERY BYPASS GRAFTING x 4 -LIMA to LAD -SVG to OM -SVG to RAMUS INTERMEDIATE -SVG to PDA  TRANSESOPHAGEAL ECHOCARDIOGRAM (TEE) (N/A)  ENDOSCOPIC HARVEST GREATER SAPHENOUS VEIN -Right Leg Vein harvest time: 30 min Vein prep time: 15 min  SURGEON:  Surgeons and Role:    * Lightfoot, Eliezer Lofts, MD - Primary  PHYSICIAN ASSISTANT: Lowella Dandy PA-C  ASSISTANTS: Virgilio Frees RNFA   ANESTHESIA:   general  EBL:  Per Anesthesia Record  BLOOD ADMINISTERED:   CC CELLSAVER  DRAINS:  Left Pleural Chest Tube, Mediastinal Chest Drain    LOCAL MEDICATIONS USED:  NONE  SPECIMEN:  No Specimen  DISPOSITION OF SPECIMEN:  N/A  COUNTS:  YES  TOURNIQUET:  * No tourniquets in log *  DICTATION: .Dragon Dictation  PLAN OF CARE: Admit to inpatient   PATIENT DISPOSITION:  ICU - intubated and hemodynamically stable.   Delay start of Pharmacological VTE agent (>24hrs) due to surgical blood loss or risk of bleeding: yes

## 2023-10-07 NOTE — Hospital Course (Addendum)
History of Present Illness:  Edwin Ford is a 42 yo obese male with history of HTN, DM, polysubstance abuse (cocaine, marijuana), nicotine abuse, and depression. The patient presented to the ED at [facility] on 11/16 with complaints of chest pain for 30 minutes duration. This was accompanied by diaphoresis, but he denied shortness of breath. The pain also radiated down both arms. He has experienced similar episodes in the past but did not seek evaluation. EKG obtained showed no ischemic changes, but there was NSR with RBBB, ST depressions in the inferior leads, T wave and ST depressions. These were new changes from previous EKG. He was treated with ASA and SL NTG. Troponin level came back elevated and he was transported to [facility]. Upon arrival he was ruled in for NSTEMI and admitted for further care. He was taken to the catheterization lab on 11/16 and found to have multivessel CAD. It was felt coronary bypass grafting would be indicated and TCTS consult was requested.  Hospital Course:   The patient remained chest pain free.  He is is currently homeless, but lives in a hotel.  He did not have a toxicology screen on admission, however the patient previously used cocaine off and on he hasn't used in several months.  He is a current smoker of 1.5-2 ppd and has been smoking about 28 years.  He denies active chest pain.  He is pretty sure he has family history of CAD.  He is active performs physical labor.  He states that he would notice "heart burn" after he would get off work, however he now know this isn't burn.  He was not taking medication for DM prior to admission, due to running out and he can't afford it.  He states he won't buy it, due to cost.  He was staying at [facility] prior to admission.  He is agreeable to have surgery, but is scared.  I explained to the patient the importance of medication compliance and good DM control to get the best outcome of his surgery.  It was felt  bypass grafting would be indicated, the risks and benefits of the procedure were explained to the patient and she was agreeable to proceed.  He was taken to the operating room and underwent CABG x 4 utilizing LIMA to LAD, SVG to Ramus Intermediate, SVG to PDA, and SVG to OM.  He also underwent endoscopic harvest of greater saphenous vein from his right leg.  He tolerated the procedure without difficulty and was taken to the SICU is stable condition.  Postoperative hospital course:  Patient was extubated using standard post cardiac surgical protocols without difficulty.  He has maintained stable hemodynamics not requiring inotropic or pressor support.  He does have an expected acute blood loss anemia which is stabilized and not at transfusion threshold.  He does have a mild reactive thrombocytopenia which is being monitored clinically and over time.  He has a good urine output and renal function has remained within normal limits. He was transitioned off the Insulin drip. His pre op HGA1C was 10.8. Of note, he was supposed to be taking Metformin but was unable to afford. He was taking Januvia and Insulin, however. He needs to establish with PCP or community health program for further diabetes management and surveillance. He had LE edema and was diuresed accordingly. He was stable for transfer from the ICU to 4E for further convalescence on 10/09/2023. His Lopressor has been titrated for sinus tachycardia. He has  been tolerating a diet and has had a bowel movement. All wounds are clean, dry, healing without signs of infection. He has been ambulating on room air with good oxygenation. He is felt stable for discharge today.

## 2023-10-08 ENCOUNTER — Encounter (HOSPITAL_COMMUNITY): Payer: Self-pay | Admitting: Thoracic Surgery (Cardiothoracic Vascular Surgery)

## 2023-10-08 ENCOUNTER — Other Ambulatory Visit: Payer: Self-pay

## 2023-10-08 ENCOUNTER — Inpatient Hospital Stay (HOSPITAL_COMMUNITY): Payer: 59

## 2023-10-08 DIAGNOSIS — I10 Essential (primary) hypertension: Secondary | ICD-10-CM

## 2023-10-08 DIAGNOSIS — Z951 Presence of aortocoronary bypass graft: Secondary | ICD-10-CM

## 2023-10-08 DIAGNOSIS — I214 Non-ST elevation (NSTEMI) myocardial infarction: Secondary | ICD-10-CM | POA: Diagnosis not present

## 2023-10-08 DIAGNOSIS — E119 Type 2 diabetes mellitus without complications: Secondary | ICD-10-CM | POA: Diagnosis not present

## 2023-10-08 DIAGNOSIS — R509 Fever, unspecified: Secondary | ICD-10-CM

## 2023-10-08 LAB — BASIC METABOLIC PANEL
Anion gap: 7 (ref 5–15)
Anion gap: 9 (ref 5–15)
BUN: 11 mg/dL (ref 6–20)
BUN: 9 mg/dL (ref 6–20)
CO2: 21 mmol/L — ABNORMAL LOW (ref 22–32)
CO2: 23 mmol/L (ref 22–32)
Calcium: 7.8 mg/dL — ABNORMAL LOW (ref 8.9–10.3)
Calcium: 8 mg/dL — ABNORMAL LOW (ref 8.9–10.3)
Chloride: 106 mmol/L (ref 98–111)
Chloride: 100 mmol/L (ref 98–111)
Creatinine, Ser: 0.89 mg/dL (ref 0.61–1.24)
Creatinine, Ser: 0.93 mg/dL (ref 0.61–1.24)
GFR, Estimated: >60 mL/min (ref >60)
GFR, Estimated: >60 mL/min (ref >60)
Glucose, Bld: 172 mg/dL — ABNORMAL HIGH (ref 70–99)
Glucose, Bld: 193 mg/dL — ABNORMAL HIGH (ref 70–99)
Potassium: 3.9 mmol/L (ref 3.5–5.1)
Potassium: 3.6 mmol/L (ref 3.5–5.1)
Sodium: 134 mmol/L — ABNORMAL LOW (ref 135–145)
Sodium: 132 mmol/L — ABNORMAL LOW (ref 135–145)

## 2023-10-08 LAB — POCT I-STAT 7, (LYTES, BLD GAS, ICA,H+H)
Acid-base deficit: 5 mmol/L — ABNORMAL HIGH (ref 0.0–2.0)
Acid-base deficit: 6 mmol/L — ABNORMAL HIGH (ref 0.0–2.0)
Bicarbonate: 21.5 mmol/L (ref 20.0–28.0)
Bicarbonate: 18.6 mmol/L — ABNORMAL LOW (ref 20.0–28.0)
Calcium, Ion: 1.19 mmol/L (ref 1.15–1.40)
Calcium, Ion: 1.07 mmol/L — ABNORMAL LOW (ref 1.15–1.40)
HCT: 26 % — ABNORMAL LOW (ref 39.0–52.0)
HCT: 25 % — ABNORMAL LOW (ref 39.0–52.0)
Hemoglobin: 8.8 g/dL — ABNORMAL LOW (ref 13.0–17.0)
Hemoglobin: 8.5 g/dL — ABNORMAL LOW (ref 13.0–17.0)
O2 Saturation: 99 %
O2 Saturation: 94 %
Patient temperature: 37.6
Patient temperature: 38.4
Potassium: 4.1 mmol/L (ref 3.5–5.1)
Potassium: 3.8 mmol/L (ref 3.5–5.1)
Sodium: 139 mmol/L (ref 135–145)
Sodium: 138 mmol/L (ref 135–145)
TCO2: 23 mmol/L (ref 22–32)
TCO2: 20 mmol/L — ABNORMAL LOW (ref 22–32)
pCO2 arterial: 44.7 mm[Hg] (ref 32–48)
pCO2 arterial: 32.9 mm[Hg] (ref 32–48)
pH, Arterial: 7.294 — ABNORMAL LOW (ref 7.35–7.45)
pH, Arterial: 7.368 (ref 7.35–7.45)
pO2, Arterial: 162 mm[Hg] — ABNORMAL HIGH (ref 83–108)
pO2, Arterial: 76 mm[Hg] — ABNORMAL LOW (ref 83–108)

## 2023-10-08 LAB — CBC
HCT: 25.8 % — ABNORMAL LOW (ref 39.0–52.0)
HCT: 31.8 % — ABNORMAL LOW (ref 39.0–52.0)
Hemoglobin: 9.3 g/dL — ABNORMAL LOW (ref 13.0–17.0)
Hemoglobin: 11.3 g/dL — ABNORMAL LOW (ref 13.0–17.0)
MCH: 30.3 pg (ref 26.0–34.0)
MCH: 30.7 pg (ref 26.0–34.0)
MCHC: 36 g/dL (ref 30.0–36.0)
MCHC: 35.5 g/dL (ref 30.0–36.0)
MCV: 84 fL (ref 80.0–100.0)
MCV: 86.4 fL (ref 80.0–100.0)
Platelets: 107 10*3/uL — ABNORMAL LOW (ref 150–400)
Platelets: 173 10*3/uL (ref 150–400)
RBC: 3.07 MIL/uL — ABNORMAL LOW (ref 4.22–5.81)
RBC: 3.68 MIL/uL — ABNORMAL LOW (ref 4.22–5.81)
RDW: 12.2 % (ref 11.5–15.5)
RDW: 12.4 % (ref 11.5–15.5)
WBC: 6.7 10*3/uL (ref 4.0–10.5)
WBC: 9.5 10*3/uL (ref 4.0–10.5)
nRBC: 0 % (ref 0.0–0.2)
nRBC: 0 % (ref 0.0–0.2)

## 2023-10-08 LAB — GLUCOSE, CAPILLARY
Glucose-Capillary: 123 mg/dL — ABNORMAL HIGH (ref 70–99)
Glucose-Capillary: 134 mg/dL — ABNORMAL HIGH (ref 70–99)
Glucose-Capillary: 141 mg/dL — ABNORMAL HIGH (ref 70–99)
Glucose-Capillary: 147 mg/dL — ABNORMAL HIGH (ref 70–99)
Glucose-Capillary: 181 mg/dL — ABNORMAL HIGH (ref 70–99)
Glucose-Capillary: 181 mg/dL — ABNORMAL HIGH (ref 70–99)
Glucose-Capillary: 181 mg/dL — ABNORMAL HIGH (ref 70–99)
Glucose-Capillary: 188 mg/dL — ABNORMAL HIGH (ref 70–99)
Glucose-Capillary: 193 mg/dL — ABNORMAL HIGH (ref 70–99)
Glucose-Capillary: 198 mg/dL — ABNORMAL HIGH (ref 70–99)
Glucose-Capillary: 204 mg/dL — ABNORMAL HIGH (ref 70–99)
Glucose-Capillary: 206 mg/dL — ABNORMAL HIGH (ref 70–99)

## 2023-10-08 LAB — MAGNESIUM
Magnesium: 2.4 mg/dL (ref 1.7–2.4)
Magnesium: 2.1 mg/dL (ref 1.7–2.4)

## 2023-10-08 LAB — ECHO INTRAOPERATIVE TEE
Height: 73 in
Weight: 4000.03 [oz_av]

## 2023-10-08 MED ORDER — HYDRALAZINE HCL 25 MG PO TABS: 25.0000 mg | ORAL_TABLET | Freq: Four times a day (QID) | ORAL | Status: DC | PRN

## 2023-10-08 MED ORDER — POTASSIUM CHLORIDE CRYS ER 20 MEQ PO TBCR
20.0000 meq | EXTENDED_RELEASE_TABLET | ORAL | Status: AC
Start: 2023-10-08 — End: 2023-10-09
  Administered 2023-10-08 – 2023-10-09 (×3): 20 meq via ORAL
  Filled 2023-10-08 (×3): qty 1

## 2023-10-08 MED ORDER — ENOXAPARIN SODIUM 40 MG/0.4ML IJ SOSY
40.0000 mg | PREFILLED_SYRINGE | Freq: Every day | INTRAMUSCULAR | Status: DC
  Administered 2023-10-08 – 2023-10-11 (×4): 40 mg via SUBCUTANEOUS
  Filled 2023-10-08 (×4): qty 0.4

## 2023-10-08 MED ORDER — AMLODIPINE BESYLATE 10 MG PO TABS
10.0000 mg | ORAL_TABLET | Freq: Every day | ORAL | Status: DC
  Administered 2023-10-08 – 2023-10-12 (×5): 10 mg via ORAL
  Filled 2023-10-08 (×5): qty 1

## 2023-10-08 MED ORDER — FUROSEMIDE 10 MG/ML IJ SOLN
40.0000 mg | Freq: Once | INTRAMUSCULAR | Status: AC
  Administered 2023-10-08: 40 mg via INTRAVENOUS
  Filled 2023-10-08: qty 4

## 2023-10-08 MED ORDER — METOPROLOL TARTRATE 25 MG PO TABS
25.0000 mg | ORAL_TABLET | Freq: Two times a day (BID) | ORAL | Status: DC
  Administered 2023-10-08 – 2023-10-09 (×3): 25 mg via ORAL
  Filled 2023-10-08 (×3): qty 1

## 2023-10-08 MED ORDER — LIDOCAINE 5 % EX PTCH
1.0000 | MEDICATED_PATCH | CUTANEOUS | Status: DC
  Administered 2023-10-08 – 2023-10-12 (×5): 1 via TRANSDERMAL
  Filled 2023-10-08 (×4): qty 1

## 2023-10-08 MED ORDER — METHOCARBAMOL 500 MG PO TABS
500.0000 mg | ORAL_TABLET | Freq: Three times a day (TID) | ORAL | Status: DC
  Administered 2023-10-08 – 2023-10-12 (×13): 500 mg via ORAL
  Filled 2023-10-08 (×13): qty 1

## 2023-10-08 MED ORDER — CLOPIDOGREL BISULFATE 75 MG PO TABS
75.0000 mg | ORAL_TABLET | Freq: Every day | ORAL | Status: DC
  Administered 2023-10-08 – 2023-10-12 (×5): 75 mg via ORAL
  Filled 2023-10-08 (×5): qty 1

## 2023-10-08 MED ORDER — INSULIN ASPART 100 UNIT/ML IJ SOLN
0.0000 [IU] | INTRAMUSCULAR | Status: DC
  Administered 2023-10-08: 4 [IU] via SUBCUTANEOUS
  Administered 2023-10-08 (×2): 8 [IU] via SUBCUTANEOUS
  Administered 2023-10-09 (×3): 4 [IU] via SUBCUTANEOUS

## 2023-10-08 NOTE — Anesthesia Postprocedure Evaluation (Signed)
Anesthesia Post Note  Patient: SAMUELE SUTCLIFFE  Procedure(s) Performed: CORONARY ARTERY BYPASS GRAFTING X four, USING LEFT INTERNAL MAMMARY ARTERY AND ENDOSCOPICALLY HARVESTED RIGHT SAPHENOUS VEIN GRAFT (Chest) TRANSESOPHAGEAL ECHOCARDIOGRAM (TEE)     Patient location during evaluation: SICU Anesthesia Type: General Level of consciousness: sedated Pain management: pain level controlled Vital Signs Assessment: post-procedure vital signs reviewed and stable Respiratory status: patient remains intubated per anesthesia plan Cardiovascular status: stable Postop Assessment: no apparent nausea or vomiting Anesthetic complications: no   There were no known notable events for this encounter.  Last Vitals:  Vitals:   10/08/23 1715 10/08/23 1730  BP: 127/80 129/69  Pulse: 84 85  Resp: 19 (!) 22  Temp:    SpO2: 95% 97%    Last Pain:  Vitals:   10/08/23 1815  TempSrc:   PainSc: 10-Worst pain ever                 Earl Lites P Amalya Salmons

## 2023-10-08 NOTE — Plan of Care (Signed)
  Problem: Clinical Measurements: Goal: Diagnostic test results will improve Outcome: Progressing Goal: Respiratory complications will improve Outcome: Progressing Goal: Cardiovascular complication will be avoided Outcome: Progressing   Problem: Fluid Volume: Goal: Ability to maintain a balanced intake and output will improve Outcome: Progressing   Problem: Coping: Goal: Ability to adjust to condition or change in health will improve Outcome: Progressing   Problem: Education: Goal: Ability to describe self-care measures that may prevent or decrease complications (Diabetes Survival Skills Education) will improve Outcome: Progressing

## 2023-10-08 NOTE — Progress Notes (Addendum)
TCTS DAILY ICU PROGRESS NOTE                   [address]            [place] [address]          [phone]   1 Day Post-Op Procedure(s) (LRB): CORONARY ARTERY BYPASS GRAFTING X four, USING LEFT INTERNAL MAMMARY ARTERY AND ENDOSCOPICALLY HARVESTED RIGHT SAPHENOUS VEIN GRAFT (N/A) TRANSESOPHAGEAL ECHOCARDIOGRAM (TEE) (N/A)  Total Length of Stay:  LOS: 5 days   Subjective: C/o of sternotomy pain - pretty good relief w/ current pain meds  Objective: Vital signs in last 24 hours: Temp:  [97.7 F (36.5 C)-101.7 F (38.7 C)] 100.8 F (38.2 C) (11/21 0700) Pulse Rate:  [59-90] 78 (11/21 0700) Cardiac Rhythm: Normal sinus rhythm (11/21 0000) Resp:  [11-26] 26 (11/21 0700) BP: (109-159)/(45-94) 122/67 (11/21 0700) SpO2:  [94 %-100 %] 98 % (11/21 0700) Arterial Line BP: (105-135)/(52-69) 113/60 (11/21 0700) FiO2 (%):  [40 %-50 %] 40 % (11/20 2320) Weight:  [111.5 kg] 111.5 kg (11/21 0600)  Filed Weights   10/03/23 1649 10/04/23 0039 10/08/23 0600  Weight: 113.4 kg 113.4 kg 111.5 kg    Weight change:    Hemodynamic parameters for last 24 hours: CVP:  [3 mmHg-13 mmHg] 7 mmHg CO:  [6.6 L/min-10.7 L/min] 9 L/min CI:  [2.8 L/min/m2-4.5 L/min/m2] 3.8 L/min/m2  Intake/Output from previous day: 11/20 0701 - 11/21 0700 In: 4987.4 [I.V.:2268.5; IV Piggyback:2718.9] Out: 2114 [Urine:1905; Chest Tube:209]  Intake/Output this shift: No intake/output data recorded.  Current Meds: Scheduled Meds:  acetaminophen  1,000 mg Oral Q6H   Or   acetaminophen (TYLENOL) oral liquid 160 mg/5 mL  1,000 mg Per Tube Q6H   aspirin EC  325 mg Oral Daily   Or   aspirin  324 mg Per Tube Daily   atorvastatin  80 mg Oral Daily   bisacodyl  10 mg Oral Daily   Or   bisacodyl  10 mg Rectal Daily   Chlorhexidine Gluconate Cloth  6 each Topical Daily   docusate sodium  200 mg Oral Daily   ezetimibe  10 mg Oral Daily   metoCLOPramide (REGLAN) injection  10 mg Intravenous Q6H    metoprolol tartrate  12.5 mg Oral BID   Or   metoprolol tartrate  12.5 mg Per Tube BID   nicotine  14 mg Transdermal Q0600   [START ON 10/09/2023] pantoprazole  40 mg Oral Daily   pantoprazole (PROTONIX) IV  40 mg Intravenous QHS   sodium chloride flush  3 mL Intravenous Q12H   Continuous Infusions:  sodium chloride Stopped (10/08/23 0535)   sodium chloride     sodium chloride     albumin human      ceFAZolin (ANCEF) IV 200 mL/hr at 10/08/23 0600   clevidipine Stopped (10/08/23 0134)   dexmedetomidine (PRECEDEX) IV infusion 0.3 mcg/kg/hr (10/08/23 0600)   insulin 0.9 Units/hr (10/08/23 0600)   lactated ringers Stopped (10/07/23 1800)   lactated ringers 20 mL/hr at 10/08/23 0600   PRN Meds:.sodium chloride, albumin human, albuterol, ALPRAZolam, dextrose, metoprolol tartrate, morphine injection, ondansetron (ZOFRAN) IV, oxyCODONE, sodium chloride flush, traMADol  General appearance: alert, cooperative, and no distress Heart: regular rate and rhythm Lungs: clear to auscultation bilaterally Abdomen: benign Extremities: no LE edema Wound: incis- dressings CDI  Lab Results: CBC: Recent Labs    10/07/23 1748 10/07/23 2103 10/08/23 0137 10/08/23 0232  WBC 7.4  --   --  6.7  HGB 9.7*   < > 8.5* 9.3*  HCT 27.9*   < > 25.0* 25.8*  PLT 107*  --   --  107*   < > = values in this interval not displayed.   BMET:  Recent Labs    10/07/23 0535 10/07/23 1412 10/07/23 1640 10/07/23 1739 10/08/23 0137 10/08/23 0232  NA 135   < > 137   < > 138 134*  K 3.8   < > 4.5   < > 3.8 3.9  CL 104   < > 106  --   --  106  CO2 20*  --   --   --   --  21*  GLUCOSE 136*   < > 177*  --   --  172*  BUN 9   < > 9  --   --  11  CREATININE 0.93   < > 0.80  --   --  0.89  CALCIUM 8.9  --   --   --   --  7.8*   < > = values in this interval not displayed.    CMET: Lab Results  Component Value Date   WBC 6.7 10/08/2023   HGB 9.3 (L) 10/08/2023   HCT 25.8 (L) 10/08/2023   PLT 107 (L)  10/08/2023   GLUCOSE 172 (H) 10/08/2023   CHOL 150 10/05/2023   TRIG 161 (H) 10/05/2023   HDL 37 (L) 10/05/2023   LDLCALC 81 10/05/2023   ALT 18 10/03/2023   AST 21 10/03/2023   NA 134 (L) 10/08/2023   K 3.9 10/08/2023   CL 106 10/08/2023   CREATININE 0.89 10/08/2023   BUN 11 10/08/2023   CO2 21 (L) 10/08/2023   INR 1.2 10/07/2023   HGBA1C 10.8 (H) 10/03/2023   MICROALBUR 54.6 (H) 02/07/2021      PT/INR:  Recent Labs    10/07/23 1748  LABPROT 15.0  INR 1.2   Radiology: [facility] Chest Port 1 View  Result Date: 10/07/2023 CLINICAL DATA:  Status post CABG. EXAM: PORTABLE CHEST 1 VIEW COMPARISON:  10/03/2023 FINDINGS: Status post CABG. Endotracheal tube tip 3.6 cm from the carina. Enteric tube tip below the diaphragm not included in the field of view. Right internal jugular central line tip overlies the upper SVC. Suspected left atrial clipping. Stable heart size and mediastinal contours. No pneumothorax, large pleural effusion or pulmonary edema. IMPRESSION: 1. Status post CABG. Support apparatus as described. 2. No pneumothorax or pulmonary edema. Electronically Signed   By: Narda Rutherford M.D.   On: 10/07/2023 20:39     Assessment/Plan: S/P Procedure(s) (LRB): CORONARY ARTERY BYPASS GRAFTING X four, USING LEFT INTERNAL MAMMARY ARTERY AND ENDOSCOPICALLY HARVESTED RIGHT SAPHENOUS VEIN GRAFT (N/A) TRANSESOPHAGEAL ECHOCARDIOGRAM (TEE) (N/A) POD#1  1 Tmax 100.8, s BP 100's-150's , excellent hemodynamics on no inotropes or pressors, sinus rhythm 2 O2 sats good on [place] 3 good UOP- weight below preop if accurate? 4 CT 209 ml- likely d/c after PW's out 5 BS well controlled , HG A1C poor recent contro- 10.8- routine protocols 6 normal renal fxn 7 expected ABLA- monitor clinically- not at transfusion threshold  8 thrombocytopenia- reactive, monitor 9 CXR - no significant effusions or infilts 10 EKG RBBB- not new 11 routine progression / rehab modalities  Rowe Clack PA-C Pager [phone] 10/08/2023 7:34 AM  Agree with above Adding pain meds Will remove pacing wires, and start plavix POD 1 progression  Abdulhamid Olgin O Freida Nebel

## 2023-10-08 NOTE — Progress Notes (Signed)
NAME:  Edwin Ford, MRN:  [id number], DOB:  January 27, 1981, LOS: 5 ADMISSION DATE:  10/03/2023, CONSULTATION DATE:  10/07/23 REFERRING MD:  Dr. Cliffton Asters, CHIEF COMPLAINT:  postop   History of Present Illness:  Pt encephalopathic, therefore HPI obtained from EMR.  67 yoM with PMH of tobacco abuse, HTN, HLD, DMT2, polysubstance abuse (cocaine, marijuana, asthma, obesity, GERD and depression who presented 11/16 with chest pain, SOB, and diaphoresis admitted to Cardiology with NSTEMI treated with ASA, heparin IV, and NTG gtt.  EKG without ischemic changes but noted new RBBB with STD in inferior leads.  Denied recent cocaine use in several months, no UDS on admit.  Underwent LHC on 11/16 showing severe multivessel CAD.  Echo showed LVEF 50-55%, no regional wall motion abnormalities, G2DD.  TCTS consulted for surgical management.  Underwent CABG on 11/20 by Dr. Cliffton Asters.    EBL - 500cc Bypass time 99 min   Pertinent  Medical History  Tobacco abuse, HTN, HLD, DMT2, polysubstance abuse (cocaine, marijuana), asthma, GERD, depression, obesity  Significant Hospital Events: Including procedures, antibiotic start and stop dates in addition to other pertinent events   11/16 admitted NSTEMI, LHC 11/20 CABG  Interim History / Subjective:  Tmax 101.1. This morning he complains of pain. Appetite has been poor.  Objective   Blood pressure 122/67, pulse 78, temperature (!) 100.8 F (38.2 C), resp. rate (!) 26, height 6\' 1"  (1.854 m), weight 111.5 kg, SpO2 98%. CVP:  [3 mmHg-13 mmHg] 7 mmHg CO:  [6.6 L/min-10.7 L/min] 9 L/min CI:  [2.8 L/min/m2-4.5 L/min/m2] 3.8 L/min/m2  Vent Mode: PSV;CPAP FiO2 (%):  [40 %-50 %] 40 % Set Rate:  [4 bmp-16 bmp] 4 bmp Vt Set:  [630 mL] 630 mL PEEP:  [5 cmH20] 5 cmH20 Pressure Support:  [10 cmH20] 10 cmH20 Plateau Pressure:  [17 cmH20] 17 cmH20   Intake/Output Summary (Last 24 hours) at 10/08/2023 0824 Last data filed at 10/08/2023 0700 Gross per 24 hour  Intake  5047.87 ml  Output 2114 ml  Net 2933.87 ml   Filed Weights   10/03/23 1649 10/04/23 0039 10/08/23 0600  Weight: 113.4 kg 113.4 kg 111.5 kg    Examination: General: chronically ill appearing man sitting up in bed in NAD HENT: [place]/AT, eyes anicteric Lungs: CTAB, breathing comfortably on   Cardiovascular: S1S2, RRR Abdomen: soft, NT Extremities: no cyanosis or significant edema Neuro: awake, alert, moving all extremities GU: foley with amber urine Derm: warm, dry, no rashes  CXR personally reviewed> RIJ introducer, chest tubes. No infiltrates.   Na+ 134 BUN 11 Cr 0.89 H/H 9.3/25.8 Platelets 107 EKG: NSR, RBBB, Qtc  Resolved Hospital Problem list   Post-op vent management  Assessment & Plan:   Multivessel CAD s/p CABG x4 NSTEMI Post bypass vasoplegia, resolved -post-op management per TCTS -start DAPT, statin, metoprolol today -tele monitoring -chest tubes, pacing wires per TCTS -progressive mobility, advance diet as tolerated -complete post-op antibiotics -post-op pain control per protocol- morphine, oxy, tramadol  DMT2, uncontrolled- A1c 10.8 -transition to basal bolus insulin; SSI PRN -goal BG 140-180 -hold PTA metformin, not sure if he was taking this PTA.  Consumptive thrombocytopenia Expected post-operative ABLA Expected post-operative consumptive thrombocytopenia  -no current indication for transfusions  H/o HTN H/o HLD -metoprolol -can add hydralazine if needed  -not taking PTA amlodipine  Fever overnight; suspect post-op fever -ambulate, pulmonary hygiene, start DVT prophylaxis  Tobacco abuse Asthma- no prior PFTs or use of inhalers PTA -tobacco cessation recommended -PRN albuterol  Obesity -  recommend long-term weight loss  Social issues - Medical non-compliance due to financial issues, not taking any medications.  Lives out of motel, otherwise homeless.  TOC consulted.  Best Practice (right click and "Reselect all SmartList  Selections" daily)   Diet/type: Regular consistency (see orders) DVT prophylaxis: LMWH Pressure ulcer(s): n/a GI prophylaxis: PPI Lines: Central line and Arterial Line Foley:  Yes, and it is still needed Code Status:  full code Last date of multidisciplinary goals of care discussion [per primary team]  Labs   CBC: Recent Labs  Lab 10/03/23 0840 10/04/23 0441 10/05/23 0727 10/06/23 0554 10/07/23 0535 10/07/23 1412 10/07/23 1748 10/07/23 2103 10/07/23 2319 10/08/23 0137 10/08/23 0232  WBC 7.3   < > 9.2 7.9 9.2  --  7.4  --   --   --  6.7  NEUTROABS 4.2  --   --   --   --   --   --   --   --   --   --   HGB 16.3   < > 13.2 11.7* 14.3   < > 9.7* 8.8* 8.2* 8.5* 9.3*  HCT 44.7   < > 36.5* 32.5* 38.9*   < > 27.9* 26.0* 24.0* 25.0* 25.8*  MCV 84.3   < > 85.7 85.5 85.1  --  85.6  --   --   --  84.0  PLT 188   < > 146* 142* 173  --  107*  --   --   --  107*   < > = values in this interval not displayed.    Basic Metabolic Panel: Recent Labs  Lab 10/03/23 0840 10/05/23 0727 10/06/23 0554 10/07/23 0535 10/07/23 1412 10/07/23 1452 10/07/23 1505 10/07/23 1604 10/07/23 1640 10/07/23 1739 10/07/23 2103 10/07/23 2319 10/08/23 0137 10/08/23 0232  NA 135 134* 134* 135 136   < > 135   < > 137 139 139 145 138 134*  K 3.8 3.6 3.7 3.8 4.0   < > 5.3*   < > 4.5 4.1 4.1 2.8* 3.8 3.9  CL 100 103 105 104 100  --  102  --  106  --   --   --   --  106  CO2 25 21* 22 20*  --   --   --   --   --   --   --   --   --  21*  GLUCOSE 231* 156* 171* 136* 159*  --  159*  --  177*  --   --   --   --  172*  BUN 9 10 10 9 10   --  10  --  9  --   --   --   --  11  CREATININE 0.92 1.03 0.98 0.93 0.90  --  0.80  --  0.80  --   --   --   --  0.89  CALCIUM 9.4 8.6* 8.3* 8.9  --   --   --   --   --   --   --   --   --  7.8*  MG  --   --   --   --   --   --   --   --   --   --   --   --   --  2.4   < > = values in this interval not displayed.   GFR: Estimated Creatinine Clearance: 138.6 mL/min (by C-G  formula based on  SCr of 0.89 mg/dL). Recent Labs  Lab 10/06/23 0554 10/07/23 0535 10/07/23 1748 10/08/23 0232  WBC 7.9 9.2 7.4 6.7       Steffanie Dunn, DO [facility] 10/08/2023 8:24 AM

## 2023-10-09 ENCOUNTER — Inpatient Hospital Stay (HOSPITAL_COMMUNITY): Payer: 59

## 2023-10-09 DIAGNOSIS — I25110 Atherosclerotic heart disease of native coronary artery with unstable angina pectoris: Secondary | ICD-10-CM

## 2023-10-09 DIAGNOSIS — E1159 Type 2 diabetes mellitus with other circulatory complications: Secondary | ICD-10-CM

## 2023-10-09 DIAGNOSIS — R739 Hyperglycemia, unspecified: Secondary | ICD-10-CM

## 2023-10-09 DIAGNOSIS — I214 Non-ST elevation (NSTEMI) myocardial infarction: Secondary | ICD-10-CM | POA: Diagnosis not present

## 2023-10-09 DIAGNOSIS — I2510 Atherosclerotic heart disease of native coronary artery without angina pectoris: Secondary | ICD-10-CM

## 2023-10-09 DIAGNOSIS — I152 Hypertension secondary to endocrine disorders: Secondary | ICD-10-CM | POA: Diagnosis not present

## 2023-10-09 LAB — CBC
HCT: 31.3 % — ABNORMAL LOW (ref 39.0–52.0)
Hemoglobin: 11.1 g/dL — ABNORMAL LOW (ref 13.0–17.0)
MCH: 30.4 pg (ref 26.0–34.0)
MCHC: 35.5 g/dL (ref 30.0–36.0)
MCV: 85.8 fL (ref 80.0–100.0)
Platelets: 156 10*3/uL (ref 150–400)
RBC: 3.65 MIL/uL — ABNORMAL LOW (ref 4.22–5.81)
RDW: 12.4 % (ref 11.5–15.5)
WBC: 9.4 10*3/uL (ref 4.0–10.5)
nRBC: 0.2 % (ref 0.0–0.2)

## 2023-10-09 LAB — BASIC METABOLIC PANEL
Anion gap: 9 (ref 5–15)
BUN: 7 mg/dL (ref 6–20)
CO2: 24 mmol/L (ref 22–32)
Calcium: 8.1 mg/dL — ABNORMAL LOW (ref 8.9–10.3)
Chloride: 101 mmol/L (ref 98–111)
Creatinine, Ser: 0.9 mg/dL (ref 0.61–1.24)
GFR, Estimated: >60 mL/min (ref >60)
Glucose, Bld: 149 mg/dL — ABNORMAL HIGH (ref 70–99)
Potassium: 3.7 mmol/L (ref 3.5–5.1)
Sodium: 134 mmol/L — ABNORMAL LOW (ref 135–145)

## 2023-10-09 LAB — GLUCOSE, CAPILLARY
Glucose-Capillary: 161 mg/dL — ABNORMAL HIGH (ref 70–99)
Glucose-Capillary: 162 mg/dL — ABNORMAL HIGH (ref 70–99)
Glucose-Capillary: 189 mg/dL — ABNORMAL HIGH (ref 70–99)
Glucose-Capillary: 251 mg/dL — ABNORMAL HIGH (ref 70–99)
Glucose-Capillary: 175 mg/dL — ABNORMAL HIGH (ref 70–99)
Glucose-Capillary: 177 mg/dL — ABNORMAL HIGH (ref 70–99)

## 2023-10-09 MED ORDER — POTASSIUM CHLORIDE CRYS ER 20 MEQ PO TBCR: 40.0000 meq | EXTENDED_RELEASE_TABLET | Freq: Every day | ORAL | Status: DC

## 2023-10-09 MED ORDER — CHLORHEXIDINE GLUCONATE CLOTH 2 % EX PADS
6.0000 | MEDICATED_PAD | Freq: Every day | CUTANEOUS | Status: DC
  Administered 2023-10-09 – 2023-10-10 (×2): 6 via TOPICAL

## 2023-10-09 MED ORDER — ORAL CARE MOUTH RINSE: 15.0000 mL | OROMUCOSAL | Status: DC | PRN

## 2023-10-09 MED ORDER — INSULIN DETEMIR 100 UNIT/ML ~~LOC~~ SOLN
10.0000 [IU] | Freq: Two times a day (BID) | SUBCUTANEOUS | Status: DC
  Administered 2023-10-09 (×2): 10 [IU] via SUBCUTANEOUS
  Filled 2023-10-09 (×4): qty 0.1

## 2023-10-09 MED ORDER — INSULIN ASPART 100 UNIT/ML IJ SOLN
0.0000 [IU] | Freq: Three times a day (TID) | INTRAMUSCULAR | Status: DC
  Administered 2023-10-09: 8 [IU] via SUBCUTANEOUS
  Administered 2023-10-09: 2 [IU] via SUBCUTANEOUS
  Administered 2023-10-10 – 2023-10-11 (×6): 3 [IU] via SUBCUTANEOUS
  Administered 2023-10-12: 5 [IU] via SUBCUTANEOUS
  Administered 2023-10-12: 2 [IU] via SUBCUTANEOUS

## 2023-10-09 MED ORDER — INSULIN ASPART 100 UNIT/ML IJ SOLN
2.0000 [IU] | Freq: Three times a day (TID) | INTRAMUSCULAR | Status: DC
  Administered 2023-10-09 – 2023-10-12 (×7): 2 [IU] via SUBCUTANEOUS

## 2023-10-09 MED ORDER — POTASSIUM CHLORIDE CRYS ER 20 MEQ PO TBCR
20.0000 meq | EXTENDED_RELEASE_TABLET | ORAL | Status: AC
Start: 2023-10-09 — End: 2023-10-09
  Administered 2023-10-09 (×3): 20 meq via ORAL
  Filled 2023-10-09 (×3): qty 1

## 2023-10-09 MED ORDER — FUROSEMIDE 40 MG PO TABS
40.0000 mg | ORAL_TABLET | Freq: Every day | ORAL | Status: DC
  Administered 2023-10-09 – 2023-10-12 (×4): 40 mg via ORAL
  Filled 2023-10-09 (×4): qty 1

## 2023-10-09 MED ORDER — MAGNESIUM SULFATE 2 GM/50ML IV SOLN: 2.0000 g | Freq: Once | INTRAVENOUS | Status: DC

## 2023-10-09 MED ORDER — ~~LOC~~ CARDIAC SURGERY, PATIENT & FAMILY EDUCATION
Freq: Once | Status: AC
Start: 2023-10-09 — End: 2023-10-09

## 2023-10-09 NOTE — Progress Notes (Addendum)
2 Days Post-Op Procedure(s) (LRB): CORONARY ARTERY BYPASS GRAFTING X four, USING LEFT INTERNAL MAMMARY ARTERY AND ENDOSCOPICALLY HARVESTED RIGHT SAPHENOUS VEIN GRAFT (N/A) TRANSESOPHAGEAL ECHOCARDIOGRAM (TEE) (N/A) Subjective: Didn't sleep well but o/w no c/o  Objective: Vital signs in last 24 hours: Temp:  [98.4 F (36.9 C)-100.8 F (38.2 C)] 99.3 F (37.4 C) (11/21 2345) Pulse Rate:  [79-95] 86 (11/22 0700) Cardiac Rhythm: Normal sinus rhythm (11/22 0400) Resp:  [11-34] 11 (11/22 0700) BP: (125-153)/(64-106) 129/64 (11/22 0700) SpO2:  [83 %-100 %] 95 % (11/22 0700) Arterial Line BP: (133-215)/(66-117) 202/110 (11/21 1115) Weight:  [110.7 kg] 110.7 kg (11/22 0452)  Hemodynamic parameters for last 24 hours: CVP:  [4 mmHg-15 mmHg] 4 mmHg CO:  [5.9 L/min-10.7 L/min] 6.2 L/min CI:  [2.5 L/min/m2-4.5 L/min/m2] 2.6 L/min/m2  Intake/Output from previous day: 11/21 0701 - 11/22 0700 In: 766.7 [P.O.:240; I.V.:386.8; IV Piggyback:139.9] Out: 2670 [Urine:2560; Chest Tube:110] Intake/Output this shift: No intake/output data recorded.  General appearance: alert, cooperative, and no distress Heart: regular rate and rhythm Lungs: clear to auscultation bilaterally Abdomen: benign Extremities: no edema Wound: incis healing well  Lab Results: Recent Labs    10/08/23 1703 10/09/23 0443  WBC 9.5 9.4  HGB 11.3* 11.1*  HCT 31.8* 31.3*  PLT 173 156   BMET:  Recent Labs    10/08/23 1703 10/09/23 0443  NA 132* 134*  K 3.6 3.7  CL 100 101  CO2 23 24  GLUCOSE 193* 149*  BUN 9 7  CREATININE 0.93 0.90  CALCIUM 8.0* 8.1*    PT/INR:  Recent Labs    10/07/23 1748  LABPROT 15.0  INR 1.2   ABG    Component Value Date/Time   PHART 7.368 10/08/2023 0137   HCO3 18.6 (L) 10/08/2023 0137   TCO2 20 (L) 10/08/2023 0137   ACIDBASEDEF 6.0 (H) 10/08/2023 0137   O2SAT 94 10/08/2023 0137   CBG (last 3)  Recent Labs    10/08/23 2344 10/09/23 0242 10/09/23 0351  GLUCAP 181* 161*  162*    Meds Scheduled Meds:  acetaminophen  1,000 mg Oral Q6H   Or   acetaminophen (TYLENOL) oral liquid 160 mg/5 mL  1,000 mg Per Tube Q6H   amLODipine  10 mg Oral Daily   aspirin EC  325 mg Oral Daily   Or   aspirin  324 mg Per Tube Daily   atorvastatin  80 mg Oral Daily   bisacodyl  10 mg Oral Daily   Or   bisacodyl  10 mg Rectal Daily   Chlorhexidine Gluconate Cloth  6 each Topical Daily   clopidogrel  75 mg Oral Daily   docusate sodium  200 mg Oral Daily   enoxaparin (LOVENOX) injection  40 mg Subcutaneous QHS   ezetimibe  10 mg Oral Daily   insulin aspart  0-24 Units Subcutaneous Q4H   lidocaine  1 patch Transdermal Q24H   methocarbamol  500 mg Oral TID   metoprolol tartrate  25 mg Oral BID   nicotine  14 mg Transdermal Q0600   pantoprazole  40 mg Oral Daily   potassium chloride  20 mEq Oral Q4H   sodium chloride flush  3 mL Intravenous Q12H   Continuous Infusions:  albumin human      ceFAZolin (ANCEF) IV 2 g (10/09/23 0554)   clevidipine Stopped (10/08/23 0134)   insulin Stopped (10/08/23 1205)   PRN Meds:.albumin human, albuterol, ALPRAZolam, dextrose, hydrALAZINE, metoprolol tartrate, morphine injection, ondansetron (ZOFRAN) IV, mouth rinse, oxyCODONE, sodium chloride  flush, traMADol  Xrays ECHO INTRAOPERATIVE TEE  Result Date: 10/08/2023  *INTRAOPERATIVE TRANSESOPHAGEAL REPORT *  Patient Name:   Edwin Ford Date of Exam: 10/07/2023 Medical Rec #:  [id number]      Height:       73.0 in Accession #:    [id number]     Weight:       250.0 lb Date of Birth:  1981/02/20      BSA:          2.37 m Patient Age:    42 years       BP:           152/94 mmHg Patient Gender: M              HR:           88 bpm. Exam Location:  Anesthesiology Transesophogeal exam was perform intraoperatively during surgical procedure. Patient was closely monitored under general anesthesia during the entirety of examination. Indications:     CAD Performing Phys: [id number] Artasia Thang O Klover Priestly  Complications: No known complications during this procedure. POST-OP IMPRESSIONS _ Left Ventricle: The left ventricle is unchanged from pre-bypass. _ Right Ventricle: The right ventricle appears unchanged from pre-bypass. _ Aorta: The aorta appears unchanged from pre-bypass. _ Left Atrial Appendage: The left atrial appendage appears unchanged from pre-bypass. _ Aortic Valve: The aortic valve appears unchanged from pre-bypass. _ Mitral Valve: The mitral valve appears unchanged from pre-bypass. _ Tricuspid Valve: The tricuspid valve appears unchanged from pre-bypass. _ Pulmonic Valve: The pulmonic valve appears unchanged from pre-bypass. _ Interatrial Septum: The interatrial septum appears unchanged from pre-bypass. _ Pericardium: The pericardium appears unchanged from pre-bypass. _ Comments: - S/P CABG. No new or worsening wall motion or valvular abnormalities. PRE-OP FINDINGS  Left Ventricle: The left ventricle has normal systolic function, with an ejection fraction of 60-65%. The cavity size was normal. No evidence of left ventricular regional wall motion abnormalities. There is mild concentric left ventricular hypertrophy. Left ventricular diastolic function could not be evaluated. Right Ventricle: The right ventricle has normal systolic function. The cavity was normal. There is no increase in right ventricular wall thickness. Right ventricular systolic pressure is normal. Left Atrium: Left atrial size was normal in size. No left atrial/left atrial appendage thrombus was detected. There is echo contrast seen in the left atrial cavity and left atrial appendage. Left atrial appendage velocity is normal at greater than 40 cm/s. Right Atrium: Right atrial size was normal in size. Interatrial Septum: No atrial level shunt detected by color flow Doppler. The interatrial septum appears to be lipomatous. There is no evidence of a patent foramen ovale. Pericardium: There is no evidence of pericardial effusion. There is no  pleural effusion. Mitral Valve: The mitral valve is normal in structure. Mitral valve regurgitation is mild by color flow Doppler. The MR jet is centrally-directed. There is no evidence of mitral valve vegetation. There is No evidence of mitral stenosis. Tricuspid Valve: The tricuspid valve was normal in structure. Tricuspid valve regurgitation was not visualized by color flow Doppler. No evidence of tricuspid stenosis is present. There is no evidence of tricuspid valve vegetation. Aortic Valve: The aortic valve is tricuspid Aortic valve regurgitation was not visualized by color flow Doppler. There is no stenosis of the aortic valve. There is no evidence of aortic valve vegetation. Pulmonic Valve: The pulmonic valve was normal in structure, with normal. No evidence of pumonic stenosis. Pulmonic valve regurgitation is not visualized by color flow Doppler. Aorta:  The aortic root and ascending aorta are normal in size and structure. Pulmonary Artery: The pulmonary artery is of normal size. Shunts: There is no evidence of an atrial septal defect.  Hester Mates Electronically signed by Hester Mates Signature Date/Time: 10/08/2023/6:47:04 PM    Final    DG Chest Port 1 View  Result Date: 10/08/2023 CLINICAL DATA:  Status post coronary artery bypass graft. EXAM: PORTABLE CHEST 1 VIEW COMPARISON:  October 07, 2023. FINDINGS: Stable cardiomediastinal silhouette. Endotracheal and nasogastric tubes have been removed. Right internal jugular catheter is unchanged. No pneumothorax is noted. Lungs are clear. IMPRESSION: No active disease. Electronically Signed   By: Lupita Raider M.D.   On: 10/08/2023 11:30   DG Chest Port 1 View  Result Date: 10/07/2023 CLINICAL DATA:  Status post CABG. EXAM: PORTABLE CHEST 1 VIEW COMPARISON:  10/03/2023 FINDINGS: Status post CABG. Endotracheal tube tip 3.6 cm from the carina. Enteric tube tip below the diaphragm not included in the field of view. Right internal jugular  central line tip overlies the upper SVC. Suspected left atrial clipping. Stable heart size and mediastinal contours. No pneumothorax, large pleural effusion or pulmonary edema. IMPRESSION: 1. Status post CABG. Support apparatus as described. 2. No pneumothorax or pulmonary edema. Electronically Signed   By: Narda Rutherford M.D.   On: 10/07/2023 20:39    Assessment/Plan: S/P Procedure(s) (LRB): CORONARY ARTERY BYPASS GRAFTING X four, USING LEFT INTERNAL MAMMARY ARTERY AND ENDOSCOPICALLY HARVESTED RIGHT SAPHENOUS VEIN GRAFT (N/A) TRANSESOPHAGEAL ECHOCARDIOGRAM (TEE) (N/A) POD#2  1 Tmax 100.8, S BP 120's-150's, sinus rhythm, hemodynamics good on no gtts 2 sats good on RA 3 excellent uop, weight stable, below preop if accurate 4 BS - a few readings in 200's, cont SSI, will add levemir 10 BID, grad transition to home meds and will need close ongoing care with poor HgA1c 5 normal renal fxn 6 H/H improved c/w yesterday- equilibrating 7 thrombocytopenia resolved 8 CXR minimal atx, no effus 9 CT 110 /24h- remove today 10 remove central lines  11 likely tx to 4 e later today- routine probression and pulm hygiene    LOS: 6 days    Rowe Clack PA-C Pager [phone] 10/09/2023  Agree with above Doing well Floor today  Corliss Skains

## 2023-10-09 NOTE — Progress Notes (Signed)
NAME:  Edwin Ford, MRN:  [id number], DOB:  [birth year], LOS: 6 ADMISSION DATE:  10/03/2023, CONSULTATION DATE:  10/07/23 REFERRING MD:  Dr. Cliffton Asters, CHIEF COMPLAINT:  postop   History of Present Illness:  Pt encephalopathic, therefore HPI obtained from EMR.  45 yoM with PMH of tobacco abuse, HTN, HLD, DMT2, polysubstance abuse (cocaine, marijuana, asthma, obesity, GERD and depression who presented 11/16 with chest pain, SOB, and diaphoresis admitted to Cardiology with NSTEMI treated with ASA, heparin IV, and NTG gtt.  EKG without ischemic changes but noted new RBBB with STD in inferior leads.  Denied recent cocaine use in several months, no UDS on admit.  Underwent LHC on 11/16 showing severe multivessel CAD.  Echo showed LVEF 50-55%, no regional wall motion abnormalities, G2DD.  TCTS consulted for surgical management.  Underwent CABG on 11/20 by Dr. Cliffton Asters.    EBL - 500cc Bypass time 99 min   Pertinent  Medical History  Tobacco abuse, HTN, HLD, DMT2, polysubstance abuse (cocaine, marijuana), asthma, GERD, depression, obesity  Significant Hospital Events: Including procedures, antibiotic start and stop dates in addition to other pertinent events   11/16 admitted NSTEMI, LHC 11/20 CABG  Interim History / Subjective:  Pain is improved this morning. Afebrile since yesterday morning.   Objective   Blood pressure (!) 149/90, pulse 93, temperature 98 F (36.7 C), temperature source Oral, resp. rate (!) 24, height 6\' 1"  (1.854 m), weight 110.7 kg, SpO2 96%. CVP:  [4 mmHg-15 mmHg] 4 mmHg CO:  [5.9 L/min-8.1 L/min] 6.2 L/min CI:  [2.5 L/min/m2-3.4 L/min/m2] 2.6 L/min/m2      Intake/Output Summary (Last 24 hours) at 10/09/2023 0954 Last data filed at 10/09/2023 0546 Gross per 24 hour  Intake 683.96 ml  Output 2670 ml  Net -1986.04 ml   Filed Weights   10/04/23 0039 10/08/23 0600 10/09/23 0452  Weight: 113.4 kg 111.5 kg 110.7 kg    Examination: General: middle aged man  lying in bed in NAD HENT: [place]/AT, eyes anicteric Lungs: breathing comfortably on RA, CTAB Cardiovascular: S1S2, RRR Abdomen: soft, NT Extremities: no cyanosis, minimal pedal edema Neuro: awake, alert, answering questions appropriately, moving all extremities.  Derm: warm, dry, no rashes  CXR personally reviewed> RIJ introducer, chest tubes. No infiltrates.   Na+ 134 K+ 3.7 BUN 7 Cr 0.9 H/H 11.1/31.3 Platelets 156 BG 140-200s  Resolved Hospital Problem list   Post-op vent management Post bypass vasoplegia, resolved Consumptive thrombocytopenia  Assessment & Plan:   Multivessel CAD s/p CABG x4 NSTEMI -post-op care per TCTS- wires, tubes, lines out today -aspirin, plavix, atorvastatin & zxetia -tele monitoring -progressive mobility, progressing PO intake as he can tolerate -pain control per protocol- morphine, oxy, tramadol  DMT2, uncontrolled- A1c 10.8 -PTA was taking metformin, but not able to get other DM meds -increase levemir to 15 units BID -start low-dose mealtime coverage, 2 units TIDAC; anticipate as his PO intake picks up this can be increased pretty quickly -goal BG 140-180 -Discussed the importance of compliance with DM meds; he wasn't checking BG at home. Cost is going to be a major barrier to his med compliance.   Expected post-operative ABLA  -monitor, no need for transufsions  H/o HTN H/o HLD -metoprolol 25mg  BID -amlodipine 10mg  daily -hydralazine PRN for sustained SBP >140 -if he needs a third med for BP, would recommend an ARB  Fever overnight; suspect post-op fever -monitor for recurrence; so far does not appear to have signs of infection  Tobacco abuse Asthma- no prior  PFTs or use of inhalers PTA -discussed tobacco cessation; he is motivated to quit -albuterol PRN  Obesity -recommend weight loss as a long-term goal  Social issues - Medical non-compliance due to financial issues, not taking any medications.  Lives out of motel, otherwise  homeless.  TOC consulted.  Best Practice (right click and "Reselect all SmartList Selections" daily)   Diet/type: Regular consistency (see orders) DVT prophylaxis: LMWH Pressure ulcer(s): n/a GI prophylaxis: PPI Lines: N/A Foley:  N/A Code Status:  full code Last date of multidisciplinary goals of care discussion [per primary team]  Labs   CBC: Recent Labs  Lab 10/03/23 0840 10/04/23 0441 10/07/23 0535 10/07/23 1412 10/07/23 1748 10/07/23 2103 10/07/23 2319 10/08/23 0137 10/08/23 0232 10/08/23 1703 10/09/23 0443  WBC 7.3   < > 9.2  --  7.4  --   --   --  6.7 9.5 9.4  NEUTROABS 4.2  --   --   --   --   --   --   --   --   --   --   HGB 16.3   < > 14.3   < > 9.7*   < > 8.2* 8.5* 9.3* 11.3* 11.1*  HCT 44.7   < > 38.9*   < > 27.9*   < > 24.0* 25.0* 25.8* 31.8* 31.3*  MCV 84.3   < > 85.1  --  85.6  --   --   --  84.0 86.4 85.8  PLT 188   < > 173  --  107*  --   --   --  107* 173 156   < > = values in this interval not displayed.    Basic Metabolic Panel: Recent Labs  Lab 10/06/23 0554 10/07/23 0535 10/07/23 1412 10/07/23 1505 10/07/23 1604 10/07/23 1640 10/07/23 1739 10/07/23 2319 10/08/23 0137 10/08/23 0232 10/08/23 1703 10/09/23 0443  NA 134* 135   < > 135   < > 137   < > 145 138 134* 132* 134*  K 3.7 3.8   < > 5.3*   < > 4.5   < > 2.8* 3.8 3.9 3.6 3.7  CL 105 104   < > 102  --  106  --   --   --  106 100 101  CO2 22 20*  --   --   --   --   --   --   --  21* 23 24  GLUCOSE 171* 136*   < > 159*  --  177*  --   --   --  172* 193* 149*  BUN 10 9   < > 10  --  9  --   --   --  11 9 7   CREATININE 0.98 0.93   < > 0.80  --  0.80  --   --   --  0.89 0.93 0.90  CALCIUM 8.3* 8.9  --   --   --   --   --   --   --  7.8* 8.0* 8.1*  MG  --   --   --   --   --   --   --   --   --  2.4 2.1  --    < > = values in this interval not displayed.   GFR: Estimated Creatinine Clearance: 136.6 mL/min (by C-G formula based on SCr of 0.9 mg/dL). Recent Labs  Lab 10/07/23 1748  10/08/23 0232 10/08/23 1703 10/09/23 0443  WBC 7.4 6.7 9.5 9.4       Steffanie Dunn, DO [facility] 10/09/2023 9:54 AM

## 2023-10-09 NOTE — TOC Initial Note (Signed)
Transition of Care [facility]) - Initial/Assessment Note    Patient Details  Name: Edwin Ford MRN: [id number] Date of Birth: Jul 08, 1981  Transition of [facility]) CM/SW Contact:    Elliot Cousin, RN Phone Number:[phone] 10/09/2023, 6:23 PM  Clinical Narrative:       CM spoke to pt at bedside. States he was working full-time and living at [employer]. Able to manage until this admission.  Sister at bedside stating they are trying to arrange a place for pt to stay with relatives. Discussed food stamps, and SS disability. States he does not have STD. He will check to see if job offers FMLA.  Will continue to follow for dc needs.               Expected Discharge Plan: Home/Self Care Barriers to Discharge: Continued Medical Work up   Patient Goals and CMS Choice Patient states their goals for this hospitalization and ongoing recovery are:: wants to recover          Expected Discharge Plan and Services   Discharge Planning Services: CM Consult   Living arrangements for the past 2 months: Hotel/Motel                                      Prior Living Arrangements/Services Living arrangements for the past 2 months: Hotel/Motel Lives with:: Self Patient language and need for interpreter reviewed:: Yes Do you feel safe going back to the place where you live?: Yes      Need for Family Participation in Patient Care: No (Comment) Care giver support system in place?: Yes (comment)      Activities of Daily Living   ADL Screening (condition at time of admission) Independently performs ADLs?: Yes (appropriate for developmental age) Is the patient deaf or have difficulty hearing?: No Does the patient have difficulty seeing, even when wearing glasses/contacts?: No Does the patient have difficulty concentrating, remembering, or making decisions?: No  Permission Sought/Granted Permission sought to share information with : Case Manager, PCP, Family Supports Permission  granted to share information with : Yes, Verbal Permission Granted  Share Information with NAME: Hermelinda Dellen     Permission granted to share info w Relationship: aunt  Permission granted to share info w Contact Information: [phone]  Emotional Assessment Appearance:: Appears stated age Attitude/Demeanor/Rapport: Engaged Affect (typically observed): Accepting Orientation: : Oriented to Self, Oriented to Place, Oriented to  Time, Oriented to Situation   Psych Involvement: No (comment)  Admission diagnosis:  NSTEMI (non-ST elevated myocardial infarction) (HCC) [I21.4] Secondary hypertension [I15.9] Chest pain, unspecified type [R07.9] Type 2 diabetes mellitus without complication, unspecified whether long term insulin use (HCC) [E11.9] Patient Active Problem List   Diagnosis Date Noted   Coronary artery disease involving native coronary artery of native heart with unstable angina pectoris (HCC) 10/09/2023   Hypertension associated with diabetes (HCC) 10/09/2023   Hyperglycemia 10/09/2023   NSTEMI (non-ST elevated myocardial infarction) (HCC) 10/03/2023   Depression 01/22/2016   Type 2 diabetes mellitus with complication (HCC) 09/19/2015   Essential hypertension, benign 09/19/2015   Hyperlipidemia 09/19/2015   Cigarette nicotine dependence, uncomplicated 09/19/2015   Morbid obesity (HCC) 09/19/2015   PCP:  Bess Kinds, MD Pharmacy:   [facility] [phone] - [place], [place] - [address] AT [facility] OF [address] [place] [address] Phone: [phone] Fax: [phone]  Social Determinants of Health (SDOH) Social History: SDOH Screenings   Food Insecurity: Food Insecurity Present (10/03/2023)  Housing: High Risk (10/03/2023)  Transportation Needs: No Transportation Needs (10/03/2023)  Utilities: Not At Risk (10/03/2023)  Depression (PHQ2-9): Low Risk  (06/13/2022)  Financial Resource Strain: Not on File  (07/11/2021)   Received from [place], [place]  Physical Activity: Not on File (07/11/2021)   Received from [place], [place]  Social Connections: Not on File (08/01/2023)   Received from [facility]  Stress: Not on File (07/11/2021)   Received from [place], [place]  Tobacco Use: High Risk (10/07/2023)   SDOH Interventions: Food Insecurity Interventions: Other (Comment) (food resurces offered to patient. Patient accepted food resources.) Housing Interventions: Other (Comment) (CSW offered patient shelter resources. Patient accepted.)   Readmission Risk Interventions     No data to display

## 2023-10-09 NOTE — Discharge Summary (Signed)
[address]       [place] [address]             [phone]    Physician Discharge Summary  Patient ID: Edwin Ford MRN: [id number] DOB/AGE: Sep 22, 1981 42 y.o.  Admit date: 10/03/2023 Discharge date: 10/12/2023  Admission Diagnoses:  Patient Active Problem List   Diagnosis Date Noted   Coronary artery disease involving native coronary artery of native heart with unstable angina pectoris (HCC) 10/09/2023   Hypertension associated with diabetes (HCC) 10/09/2023   Hyperglycemia 10/09/2023   NSTEMI (non-ST elevated myocardial infarction) (HCC) 10/03/2023   Depression 01/22/2016   Type 2 diabetes mellitus with complication (HCC) 09/19/2015   Essential hypertension, benign 09/19/2015   Hyperlipidemia 09/19/2015   Cigarette nicotine dependence, uncomplicated 09/19/2015   Morbid obesity (HCC) 09/19/2015     Discharge Diagnoses:  Patient Active Problem List   Diagnosis Date Noted   Coronary artery disease involving native coronary artery of native heart with unstable angina pectoris (HCC) 10/09/2023   Hypertension associated with diabetes (HCC) 10/09/2023   Hyperglycemia 10/09/2023   NSTEMI (non-ST elevated myocardial infarction) (HCC) 10/03/2023   Depression 01/22/2016   Type 2 diabetes mellitus with complication (HCC) 09/19/2015   Essential hypertension, benign 09/19/2015   Hyperlipidemia 09/19/2015   Cigarette nicotine dependence, uncomplicated 09/19/2015   Morbid obesity (HCC) 09/19/2015     Discharged Condition: stable  History of Present Illness:  Edwin Ford is a 42 yo obese male with history of HTN, DM, polysubstance abuse (cocaine, marijuana), nicotine abuse, and depression. The patient presented to the ED at [facility] on 11/16 with complaints of chest pain for 30 minutes duration. This was accompanied by diaphoresis, but he denied shortness of breath. The pain also radiated down both arms. He has experienced similar episodes  in the past but did not seek evaluation. EKG obtained showed no ischemic changes, but there was NSR with RBBB, ST depressions in the inferior leads, T wave and ST depressions. These were new changes from previous EKG. He was treated with ASA and SL NTG. Troponin level came back elevated and he was transported to [facility]. Upon arrival he was ruled in for NSTEMI and admitted for further care. He was taken to the catheterization lab on 11/16 and found to have multivessel CAD. It was felt coronary bypass grafting would be indicated and TCTS consult was requested.  Hospital Course:   The patient remained chest pain free.  He is is currently homeless, but lives in a hotel.  He did not have a toxicology screen on admission, however the patient previously used cocaine off and on he hasn't used in several months.  He is a current smoker of 1.5-2 ppd and has been smoking about 28 years.  He denies active chest pain.  He is pretty sure he has family history of CAD.  He is active performs physical labor.  He states that he would notice "heart burn" after he would get off work, however he now know this isn't burn.  He was not taking medication for DM prior to admission, due to running out and he can't afford it.  He states he won't buy it, due to cost.  He was staying at [facility] prior to admission.  He is agreeable to have surgery, but is scared.  I explained to the patient the importance of medication compliance and good DM control to get the best outcome of his surgery.  It was felt bypass grafting would be indicated, the risks and benefits of the procedure were explained to the patient and she was agreeable to proceed.  He was taken to the operating room and underwent CABG x 4 utilizing LIMA to LAD, SVG to Ramus Intermediate, SVG to PDA, and SVG to OM.  He also underwent endoscopic harvest of greater saphenous vein from his right leg.  He tolerated the procedure without difficulty and was taken to the SICU is  stable condition.  Postoperative hospital course:  Patient was extubated using standard post cardiac surgical protocols without difficulty.  He has maintained stable hemodynamics not requiring inotropic or pressor support.  He does have an expected acute blood loss anemia which is stabilized and not at transfusion threshold.  He does have a mild reactive thrombocytopenia which is being monitored clinically and over time.  He has a good urine output and renal function has remained within normal limits. He was transitioned off the Insulin drip. His pre op HGA1C was 10.8. Of note, he was supposed to be taking Metformin but was unable to afford. He was taking Januvia and Insulin, however. He needs to establish with PCP or community health program for further diabetes management and surveillance. He had LE edema and was diuresed accordingly. He was stable for transfer from the ICU to 4E for further convalescence on 10/09/2023. His Lopressor has been titrated for sinus tachycardia.  Lisinopril was resumed at 20 mg p.o. daily for borderline hypertension.  We consulted with the diabetes coordinator and recommended replacing his Lantus with NovoLog 70/30 15 units subcu twice daily.  This change was made to facilitate significant cost savings with better control of his diabetes since he was not able to afford the Lantus prior to admission.  He has been tolerating a diet and has had a bowel movement. All wounds are clean, dry, healing without signs of infection. He has been ambulating on room air with good oxygenation. He is felt stable for discharge today.    Consults: cardiology and pulmonary/intensive care  Significant Diagnostic Studies:  DG Chest 2 View  Result Date: 10/10/2023 CLINICAL DATA:  Chest pain EXAM: CHEST - 2 VIEW COMPARISON:  10/09/2023 FINDINGS: Removal of right-sided central venous catheter. Post sternotomy changes. Normal cardiac size. Small bilateral effusions. IMPRESSION: Small bilateral  effusions. Removal of right IJ central venous catheter Electronically Signed   By: Jasmine Pang M.D.   On: 10/10/2023 19:53   DG Chest Port 1 View  Result Date: 10/09/2023 CLINICAL DATA:  42 year old male status post CABG. Postoperative day 2. EXAM: PORTABLE CHEST 1 VIEW COMPARISON:  Portable chest yesterday and earlier. FINDINGS: Portable AP upright view at 0548 hours. Right IJ central line remains in place. No other lines or tubes identified. Low lung volumes. Allowing for portable technique the lungs are clear. No pneumothorax. Mediastinal contour within normal limits, sequelae of CABG. Visualized tracheal air column is within normal limits. IMPRESSION: Status post CABG with right IJ central line in place. No acute cardiopulmonary abnormality. Electronically Signed   By: Odessa Fleming M.D.   On: 10/09/2023 09:16   ECHO INTRAOPERATIVE TEE  Result Date: 10/08/2023  *INTRAOPERATIVE TRANSESOPHAGEAL REPORT *  Patient Name:   Edwin Ford Date of Exam: 10/07/2023 Medical Rec #:  [id number]      Height:       73.0 in Accession #:    [id number]     Weight:       250.0 lb Date of Birth:  1981/11/14      BSA:          2.37 m Patient Age:    42 years       BP:           152/94 mmHg Patient Gender: M              HR:           88 bpm. Exam Location:  Anesthesiology Transesophogeal exam was perform intraoperatively during surgical procedure. Patient was closely monitored under general anesthesia during the entirety of examination. Indications:     CAD Performing Phys: [id number] HARRELL O LIGHTFOOT Complications: No known complications during this procedure. POST-OP IMPRESSIONS _ Left Ventricle: The left ventricle is unchanged from pre-bypass. _ Right Ventricle: The right ventricle appears unchanged from pre-bypass. _ Aorta: The aorta appears unchanged from pre-bypass. _ Left Atrial Appendage: The left atrial appendage appears unchanged from pre-bypass. _ Aortic Valve: The aortic valve appears unchanged from pre-bypass. _  Mitral Valve: The mitral valve appears unchanged from pre-bypass. _ Tricuspid Valve: The tricuspid valve appears unchanged from pre-bypass. _ Pulmonic Valve: The pulmonic valve appears unchanged from pre-bypass. _ Interatrial Septum: The interatrial septum appears unchanged from pre-bypass. _ Pericardium: The pericardium appears unchanged from pre-bypass. _ Comments: - S/P CABG. No new or worsening wall motion or valvular abnormalities. PRE-OP FINDINGS  Left Ventricle: The left ventricle has normal systolic function, with an ejection fraction of 60-65%. The cavity size was normal. No evidence of left ventricular regional wall motion abnormalities. There is mild concentric left ventricular hypertrophy. Left ventricular diastolic function could not be evaluated. Right Ventricle: The right ventricle has normal systolic function. The cavity was normal. There is no increase in right ventricular wall thickness. Right ventricular systolic pressure is normal. Left Atrium: Left atrial size was normal in size. No left atrial/left atrial appendage thrombus was detected. There is echo contrast seen in the left atrial cavity and left atrial appendage. Left atrial appendage velocity is normal at greater than 40 cm/s. Right Atrium: Right atrial size was normal in size. Interatrial Septum: No atrial level shunt detected by color flow Doppler. The interatrial septum appears to be lipomatous. There is no evidence of a patent foramen ovale. Pericardium: There is no evidence of pericardial effusion. There is no pleural effusion. Mitral Valve: The mitral valve is normal in structure. Mitral valve regurgitation is mild by color flow Doppler. The MR jet is centrally-directed. There is no evidence of mitral valve vegetation. There is No evidence of mitral stenosis. Tricuspid Valve: The tricuspid valve was normal in structure. Tricuspid valve regurgitation was not visualized by color flow Doppler. No evidence of tricuspid stenosis is  present. There is no evidence of tricuspid valve vegetation. Aortic Valve: The aortic valve is tricuspid Aortic valve regurgitation was not visualized by color flow Doppler. There is no stenosis of the aortic valve. There is no evidence of aortic valve vegetation. Pulmonic Valve: The pulmonic valve was normal in structure, with normal. No evidence of pumonic stenosis. Pulmonic valve regurgitation is not visualized by color flow Doppler. Aorta: The aortic root and ascending aorta are normal in size and structure. Pulmonary Artery: The pulmonary artery is of normal size. Shunts: There is no evidence of an atrial septal defect.  Hester Mates Electronically signed by Hester Mates Signature Date/Time: 10/08/2023/6:47:04 PM    Final    DG Chest Port 1 View  Result Date: 10/08/2023 CLINICAL DATA:  Status post coronary artery bypass graft. EXAM: PORTABLE  CHEST 1 VIEW COMPARISON:  October 07, 2023. FINDINGS: Stable cardiomediastinal silhouette. Endotracheal and nasogastric tubes have been removed. Right internal jugular catheter is unchanged. No pneumothorax is noted. Lungs are clear. IMPRESSION: No active disease. Electronically Signed   By: Lupita Raider M.D.   On: 10/08/2023 11:30   DG Chest Port 1 View  Result Date: 10/07/2023 CLINICAL DATA:  Status post CABG. EXAM: PORTABLE CHEST 1 VIEW COMPARISON:  10/03/2023 FINDINGS: Status post CABG. Endotracheal tube tip 3.6 cm from the carina. Enteric tube tip below the diaphragm not included in the field of view. Right internal jugular central line tip overlies the upper SVC. Suspected left atrial clipping. Stable heart size and mediastinal contours. No pneumothorax, large pleural effusion or pulmonary edema. IMPRESSION: 1. Status post CABG. Support apparatus as described. 2. No pneumothorax or pulmonary edema. Electronically Signed   By: Narda Rutherford M.D.   On: 10/07/2023 20:39   VAS US DOPPLER PRE CABG  Result Date: 10/06/2023 PREOPERATIVE  VASCULAR EVALUATION Patient Name:  Edwin Ford  Date of Exam:   10/06/2023 Medical Rec #: [id number]       Accession #:    [id number] Date of Birth: 09-01-1981       Patient Gender: M Patient Age:   39 years Exam Location:  [facility] Procedure:      VAS US DOPPLER PRE CABG Referring Phys: HARRELL LIGHTFOOT --------------------------------------------------------------------------------  Indications:  Pre-CABG. Risk Factors: Hypertension, hyperlipidemia, Diabetes, coronary artery disease. Performing Technologist: Marilynne Halsted RDMS, RVT  Examination Guidelines: A complete evaluation includes B-mode imaging, spectral Doppler, color Doppler, and power Doppler as needed of all accessible portions of each vessel. Bilateral testing is considered an integral part of a complete examination. Limited examinations for reoccurring indications may be performed as noted.  Right Carotid Findings: +----------+--------+--------+--------+--------+------------------+           PSV cm/sEDV cm/sStenosisDescribeComments           +----------+--------+--------+--------+--------+------------------+ CCA Prox  76      14                                         +----------+--------+--------+--------+--------+------------------+ CCA Distal65      19                      intimal thickening +----------+--------+--------+--------+--------+------------------+ ICA Prox  51      17                      intimal thickening +----------+--------+--------+--------+--------+------------------+ ICA Distal62      29                                         +----------+--------+--------+--------+--------+------------------+ ECA       112     17                                         +----------+--------+--------+--------+--------+------------------+ +----------+--------+-------+----------------+------------+           PSV cm/sEDV cmsDescribe        Arm Pressure  +----------+--------+-------+----------------+------------+ Subclavian94             Multiphasic, WNL             +----------+--------+-------+----------------+------------+ +---------+--------+--+--------+--+---------+  VertebralPSV cm/s38EDV cm/s12Antegrade +---------+--------+--+--------+--+---------+ Left Carotid Findings: +----------+--------+--------+--------+--------+------------------+           PSV cm/sEDV cm/sStenosisDescribeComments           +----------+--------+--------+--------+--------+------------------+ CCA Distal96      18                      intimal thickening +----------+--------+--------+--------+--------+------------------+ ICA Prox  100     20                      intimal thickening +----------+--------+--------+--------+--------+------------------+ ICA Distal71      27                                         +----------+--------+--------+--------+--------+------------------+ ECA       110     13                                         +----------+--------+--------+--------+--------+------------------+ +----------+--------+--------+----------------+------------+ SubclavianPSV cm/sEDV cm/sDescribe        Arm Pressure +----------+--------+--------+----------------+------------+           107             Multiphasic, WNL             +----------+--------+--------+----------------+------------+ +---------+--------+--+--------+--+---------+ VertebralPSV cm/s41EDV cm/s19Antegrade +---------+--------+--+--------+--+---------+  ABI Findings: +---------+--------+ Waveform Comment  +---------+--------+ triphasicBrachial +---------+--------+ triphasicPTA      +---------+--------+ triphasicDP       +---------+--------+ +---------+--------+ Waveform Comment  +---------+--------+ triphasicBrachial +---------+--------+ triphasicPTA      +---------+--------+ triphasicDP       +---------+--------+  Right Doppler Findings:  +-----------+---------+--------+ Site       Doppler  Comments +-----------+---------+--------+ Brachial   triphasicBrachial +-----------+---------+--------+ Radial     triphasic         +-----------+---------+--------+ Ulnar      triphasic         +-----------+---------+--------+ Palmar Arch         Patent   +-----------+---------+--------+  Left Doppler Findings: +-----------+---------+-----------------+ Site       Doppler  Comments          +-----------+---------+-----------------+ Brachial   triphasicBrachial          +-----------+---------+-----------------+ Radial     triphasic                  +-----------+---------+-----------------+ Ulnar      triphasic                  +-----------+---------+-----------------+ Palmar Arch         Patent incomplete +-----------+---------+-----------------+  Summary: Right Carotid: The extracranial vessels were near-normal with only minimal wall                thickening or plaque. Left Carotid: The extracranial vessels were near-normal with only minimal wall               thickening or plaque. Vertebrals:  Bilateral vertebral arteries demonstrate antegrade flow. Subclavians: Normal flow hemodynamics were seen in bilateral subclavian              arteries. Right ABI: Normal triphasic waveforms. Left ABI: Normal triphasic waveforms. Right Upper Extremity: Doppler waveforms remain within normal limits with right radial compression. Doppler waveforms remain within normal limits with right ulnar compression. Left Upper Extremity: Doppler waveforms  remain within normal limits with left radial compression. Doppler waveform obliterate with left ulnar compression.  Electronically signed by Coral Else MD on 10/06/2023 at 7:40:38 PM.    Final    CARDIAC CATHETERIZATION  Result Date: 10/03/2023   LCA:   CULPRIT LESION: Ramus lesion is 99% stenosed.   Prox LAD to Mid LAD lesion is 70% stenosed.   Small caliber 1st Diag lesion is 80%  stenosed.  Small caliber 2nd Diag lesion is 60% stenosed.   Prox Cx lesion is 80% stenosed. Prox Cx to Mid Cx lesion is 70% stenosed.   Very small caliber 1st LPL lesion is 100% stenosed.   RCA:   RV Branch lesion is 85% stenosed.   Mid RCA lesion is 75% stenosed. Dist RCA-1 lesion is 55% stenosed.   Bifurcation Dist RCA<RPDA<RPA V: Dist RCA-2 lesion is 99% stenosed with 95% stenosed side branch in RPAV.  RPDA lesion is 75% stenosed.   Hemodynamics:   LV end diastolic pressure is moderately elevated.   There is no aortic valve stenosis. POST-CATH DIAGNOSES Severe multivessel CAD involving 75% mid RCA and 99% RCA-PDA-PLV bifurcation, tandem lesions in the proximal to mid LCx 80% and 70%-with occluded SVG LCx-LPL 1, 99% proximal RI (culprit), 70% proximal LAD (eccentric ulcerated) Moderately elevated LVEDP-with low normal EF on Echo of 50 to 55% (anterolateral hypokinesis.)  RECOMMENDATIONS Patient will need CVTS consultation Will restart IV heparin 2 hours after TR band removal Continue IV nitroglycerin Continue aggressive RF management with high-dose statin, glycemic control, and beta-blocker/ARB    ECHOCARDIOGRAM COMPLETE  Result Date: 10/03/2023    ECHOCARDIOGRAM REPORT   Patient Name:   Edwin Ford Date of Exam: 10/03/2023 Medical Rec #:  [id number]      Height:       73.0 in Accession #:    [id number]     Weight:       250.0 lb Date of Birth:  January 11, 1981      BSA:          2.366 m Patient Age:    42 years       BP:           134/74 mmHg Patient Gender: M              HR:           70 bpm. Exam Location:  Inpatient Procedure: 2D Echo, Color Doppler, Cardiac Doppler and Strain Analysis STAT ECHO Indications:    NSTEMI  History:        Patient has no prior history of Echocardiogram examinations.                 Risk Factors:Current Smoker, Diabetes, Hypertension and                 Dyslipidemia.  Sonographer:    Delcie Roch RDCS Referring Phys: [id number] MAHESH A CHANDRASEKHAR IMPRESSIONS  1. Abnormal  anterior and anterolateral regional strain. Left ventricular ejection fraction, by estimation, is 50 to 55%. The left ventricle has low normal function. The left ventricle has no regional wall motion abnormalities. There is mild asymmetric left ventricular hypertrophy of the inferior segment. Left ventricular diastolic parameters are consistent with Grade II diastolic dysfunction (pseudonormalization). Anterior and lateral hypokinesis . The average left ventricular global longitudinal strain is -13.6 %. The global longitudinal strain is abnormal.  2. Right ventricular systolic function is normal. The right ventricular size is moderately enlarged.  3. The mitral valve is normal in structure.  Trivial mitral valve regurgitation. No evidence of mitral stenosis.  4. The aortic valve is tricuspid. Aortic valve regurgitation is not visualized. No aortic stenosis is present.  5. The inferior vena cava is normal in size with greater than 50% respiratory variability, suggesting right atrial pressure of 3 mmHg. Comparison(s): No prior Echocardiogram. FINDINGS  Left Ventricle: Abnormal anterior and anterolateral regional strain. Left ventricular ejection fraction, by estimation, is 50 to 55%. The left ventricle has low normal function. The left ventricle has no regional wall motion abnormalities. The average left ventricular global longitudinal strain is -13.6 %. The global longitudinal strain is abnormal. The left ventricular internal cavity size was normal in size. There is mild asymmetric left ventricular hypertrophy of the inferior segment. Left ventricular diastolic parameters are consistent with Grade II diastolic dysfunction (pseudonormalization).  LV Wall Scoring: The entire anterior wall, antero-lateral wall, and apical lateral segment are hypokinetic. The entire septum, entire inferior wall, and posterior wall are normal. Anterior and lateral hypokinesis. Right Ventricle: The right ventricular size is moderately  enlarged. No increase in right ventricular wall thickness. Right ventricular systolic function is normal. Left Atrium: Left atrial size was normal in size. Right Atrium: Right atrial size was normal in size. Pericardium: There is no evidence of pericardial effusion. Mitral Valve: The mitral valve is normal in structure. Trivial mitral valve regurgitation. No evidence of mitral valve stenosis. Tricuspid Valve: The tricuspid valve is normal in structure. Tricuspid valve regurgitation is trivial. No evidence of tricuspid stenosis. Aortic Valve: The aortic valve is tricuspid. Aortic valve regurgitation is not visualized. No aortic stenosis is present. Pulmonic Valve: The pulmonic valve was normal in structure. Pulmonic valve regurgitation is not visualized. No evidence of pulmonic stenosis. Aorta: The aortic root and ascending aorta are structurally normal, with no evidence of dilitation. Venous: The inferior vena cava is normal in size with greater than 50% respiratory variability, suggesting right atrial pressure of 3 mmHg. IAS/Shunts: The atrial septum is grossly normal.  LEFT VENTRICLE PLAX 2D LVIDd:         4.90 cm   Diastology LVIDs:         2.90 cm   LV e' medial:    6.20 cm/s LV PW:         1.30 cm   LV E/e' medial:  15.6 LV IVS:        1.10 cm   LV e' lateral:   6.42 cm/s LVOT diam:     2.00 cm   LV E/e' lateral: 15.1 LV SV:         74 LV SV Index:   31        2D Longitudinal Strain LVOT Area:     3.14 cm  2D Strain GLS Avg:     -13.6 %  RIGHT VENTRICLE             IVC RV Basal diam:  2.50 cm     IVC diam: 2.10 cm RV S prime:     12.30 cm/s TAPSE (M-mode): 2.2 cm LEFT ATRIUM             Index        RIGHT ATRIUM           Index LA diam:        3.50 cm 1.48 cm/m   RA Area:     13.80 cm LA Vol (A2C):   48.6 ml 20.54 ml/m  RA Volume:   33.10 ml  13.99 ml/m LA Vol (  A4C):   51.1 ml 21.60 ml/m LA Biplane Vol: 51.1 ml 21.60 ml/m  AORTIC VALVE LVOT Vmax:   120.00 cm/s LVOT Vmean:  74.400 cm/s LVOT VTI:    0.237  m  AORTA Ao Root diam: 3.40 cm Ao Asc diam:  3.60 cm MITRAL VALVE MV Area (PHT): 3.60 cm    SHUNTS MV Decel Time: 211 msec    Systemic VTI:  0.24 m MV E velocity: 96.90 cm/s  Systemic Diam: 2.00 cm MV A velocity: 91.20 cm/s MV E/A ratio:  1.06 Riley Lam MD Electronically signed by Riley Lam MD Signature Date/Time: 10/03/2023/1:15:03 PM    Final    DG Chest Portable 1 View  Result Date: 10/03/2023 CLINICAL DATA:  Chest pain. EXAM: PORTABLE CHEST 1 VIEW COMPARISON:  One-view chest x-ray scratched at two-view chest x-ray 05/30/2023 FINDINGS: Heart size is normal. Minimal airspace disease is present at the left base. Lung volumes are low. No other focal airspace disease is present. No significant pleural effusion or pneumothorax is present. The visualized soft tissues and bony thorax are unremarkable. IMPRESSION: Minimal airspace disease at the left base may represent atelectasis or early pneumonia. Electronically Signed   By: Marin Roberts M.D.   On: 10/03/2023 09:07     Treatments: surgery: Surgery  10/07/2023 Patient:  Edwin Ford Pre-Op Dx: NSTEMI 3V CAD Obesity HTN HLP Hx of polysubstance abuse   Post-op Dx:  same Procedure: CABG X 4.  LIMA LAD, RSVG PDA, OM, Ramus   Endoscopic greater saphenous vein harvest on the right     Surgeon and Role:      * Lightfoot, Eliezer Lofts, MD - Primary    * E. Barrett , PA-C - assisting  Discharge Exam: Blood pressure (!) 147/78, pulse 96, temperature 98.6 F (37 C), temperature source Oral, resp. rate 15, height 6\' 1"  (1.854 m), weight 107.7 kg, SpO2 97%.  Cardiovascular: RRR, rate 90's-100 Pulmonary: breath sounds coarse in bases Abdomen: Soft, non tender, bowel sounds present. Extremities: Mild bilateral lower extremity edema. Wounds: Clean and dry.  No erythema or signs of infection.   Discharge Medications:  The patient has been discharged on:   1.Beta Blocker:  Yes [ x ]                              No    [   ]                              If No, reason:  2.Ace Inhibitor/ARB: Yes [ x ]                                     No  [    ]                                     If No, reason:  3.Statin:   Yes [ x]                  No  [   ]                  If No, reason:  4.Ecasa:  Yes  [  x]  No   [   ]                  If No, reason:  Patient had ACS upon admission:  NSTEMI  Plavix/P2Y12 inhibitor: Yes [ x ]                                      No  [   ]     Discharge Instructions     Amb Referral to Cardiac Rehabilitation   Complete by: As directed    Diagnosis:  CABG NSTEMI     CABG X ___: 4   After initial evaluation and assessments completed: Virtual Based Care may be provided alone or in conjunction with Phase 2 Cardiac Rehab based on patient barriers.: Yes   Intensive Cardiac Rehabilitation (ICR) [facility] location only OR Traditional Cardiac Rehabilitation (TCR) *If criteria for ICR are not met will enroll in TCR [facility] only): Yes      Allergies as of 10/12/2023       Reactions   Bee Pollen    Other reaction(s): Other (See Comments) Sinus drainage Other reaction(s): Other (See Comments) Other reaction(s): Other (See Comments) Sinus drainage Sinus drainage   Pollen Extract Other (See Comments)   Sinus drainage        Medication List     STOP taking these medications    Lantus SoloStar 100 UNIT/ML Solostar Pen Generic drug: insulin glargine   metFORMIN 500 MG 24 hr tablet Commonly known as: GLUCOPHAGE-XR   traZODone 100 MG tablet Commonly known as: DESYREL       TAKE these medications    albuterol 108 (90 Base) MCG/ACT inhaler Commonly known as: Proventil HFA INHALE 2 PUFFS BY MOUTH EVERY 6 HOURS AS NEEDED FOR COUGHING, WHEEZING, OR SHORTNESS OF BREATH What changed:  how much to take how to take this when to take this reasons to take this   alprazolam 2 MG tablet Commonly known as: XANAX Take 2 mg by mouth 3 (three) times daily  as needed for sleep.   amLODipine 10 MG tablet Commonly known as: NORVASC TAKE 1 Tablet BY MOUTH ONCE EVERY DAY   aspirin EC 81 MG tablet Take 1 tablet (81 mg total) by mouth daily. Swallow whole.   atorvastatin 80 MG tablet Commonly known as: LIPITOR TAKE 1 Tablet BY MOUTH ONCE EVERY DAY What changed: medication strength   Blood Pressure Cuff Misc Take BP 3-4 times weekly.   clopidogrel 75 MG tablet Commonly known as: PLAVIX Take 1 tablet (75 mg total) by mouth daily.   ezetimibe 10 MG tablet Commonly known as: ZETIA Take 1 tablet (10 mg total) by mouth daily.   insulin aspart protamine - aspart (70-30) 100 UNIT/ML FlexPen Commonly known as: NOVOLOG 70/30 MIX Inject 15 Units into the skin 2 (two) times daily with a meal.   lisinopril 20 MG tablet Commonly known as: ZESTRIL Take 1 tablet (20 mg total) by mouth daily. Start taking on: October 13, 2023 What changed:  medication strength how much to take how to take this when to take this additional instructions   metoprolol tartrate 50 MG tablet Commonly known as: LOPRESSOR Take 1 tablet (50 mg total) by mouth 2 (two) times daily. What changed:  medication strength how much to take   nicotine 14 mg/24hr patch Commonly known as: NICODERM CQ - dosed in mg/24 hours Place 1 patch (14  mg total) onto the skin daily at 6 (six) AM. Start taking on: October 13, 2023   omeprazole 20 MG capsule Commonly known as: PRILOSEC Take 1 capsule (20 mg total) by mouth daily.   oxyCODONE 5 MG immediate release tablet Commonly known as: Oxy IR/ROXICODONE Take 1 tablet (5 mg total) by mouth every 6 (six) hours as needed for severe pain (pain score 7-10).   sertraline 100 MG tablet Commonly known as: ZOLOFT Take 2 tablets (200 mg total) by mouth daily.   sitaGLIPtin 100 MG tablet Commonly known as: Januvia TAKE 1 Tablet BY MOUTH ONCE EVERY DAY        Follow-up Information     Corliss Skains, MD Follow up on  10/23/2023.   Specialty: Cardiothoracic Surgery Why: Your first appointment will be a virtual (phone call) appointment with Dr. Cliffton Asters.  He will call you at around 2:30pm. Contact information: [address] [place] [address] [phone]         Tereso Newcomer T, PA-C. Go on 10/30/2023.   Specialties: Cardiology, Physician Assistant Why: Your appointment is at 10:55am. Contact information: [address] [place] [address] [phone]         Bess Kinds, MD. Call.   Specialty: Family Medicine Why: Call for a follow up appointment regarding furhter diabetes management and surveillance of [facility] Contact information: [address] [place] [address] [phone]                 Signed:  Leary Roca, PA-C  10/12/2023, 5:03 PM

## 2023-10-09 NOTE — Progress Notes (Signed)
   Patient Name: Edwin Ford Date of Encounter: 10/09/2023 [facility] Cardiologist: None   Interval Summary  .    C/o Chest wall soreness. No dyspnea. No events overnight  Vital Signs .    Vitals:   10/09/23 0452 10/09/23 0500 10/09/23 0600 10/09/23 0700  BP:  (!) 145/84 131/82 129/64  Pulse:  94 90 86  Resp:  (!) 34 (!) 25 11  Temp:      TempSrc:      SpO2:  93% 92% 95%  Weight: 110.7 kg     Height: 6\' 1"  (1.854 m)       Intake/Output Summary (Last 24 hours) at 10/09/2023 0730 Last data filed at 10/09/2023 0546 Gross per 24 hour  Intake 766.66 ml  Output 2670 ml  Net -1903.34 ml      10/09/2023    4:52 AM 10/08/2023    6:00 AM 10/04/2023   12:39 AM  Last 3 Weights  Weight (lbs) 244 lb 0.8 oz 245 lb 13 oz 250 lb  Weight (kg) 110.7 kg 111.5 kg 113.4 kg     Hospital Medications:   acetaminophen  1,000 mg Oral Q6H   Or   acetaminophen (TYLENOL) oral liquid 160 mg/5 mL  1,000 mg Per Tube Q6H   amLODipine  10 mg Oral Daily   aspirin EC  325 mg Oral Daily   Or   aspirin  324 mg Per Tube Daily   atorvastatin  80 mg Oral Daily   bisacodyl  10 mg Oral Daily   Or   bisacodyl  10 mg Rectal Daily   Chlorhexidine Gluconate Cloth  6 each Topical Daily   clopidogrel  75 mg Oral Daily   docusate sodium  200 mg Oral Daily   enoxaparin (LOVENOX) injection  40 mg Subcutaneous QHS   ezetimibe  10 mg Oral Daily   insulin aspart  0-24 Units Subcutaneous Q4H   insulin detemir  10 Units Subcutaneous BID   lidocaine  1 patch Transdermal Q24H   methocarbamol  500 mg Oral TID   metoprolol tartrate  25 mg Oral BID   nicotine  14 mg Transdermal Q0600   pantoprazole  40 mg Oral Daily   potassium chloride  20 mEq Oral Q4H   sodium chloride flush  3 mL Intravenous Q12H     Telemetry/ECG    Sinus - Personally Reviewed  Physical Exam .    General: Well developed, well nourished, NAD  SKIN: warm, dry. No rashes. Neuro: No focal deficits  Musculoskeletal: Muscle  strength 5/5 all ext  Psychiatric: Mood and affect normal  Neck: No JVD Lungs:Clear bilaterally, no wheezes, rhonci, crackles Cardiovascular: Regular rate and rhythm. No murmurs, gallops or rubs. Abdomen:Soft Extremities: No lower extremity edema.  Assessment & Plan .     42 y.o. male   with a history of hypertension, hyperlipidemia, type 2 diabetes, and significant obesity, presented with a NSTEMI.    CAD/NSTEMI: Cardiac cath 10/03/23 with severe multi-vessel CAD. He is now POD 2 following CABG. He is progressing well post surgery. Hemodynamically stable. Sinus rhythm. No volume overload on exam.  -Continue ASA, Plavix, statin and beta blocker.    For questions or updates, please contact [facility] Please consult [email or web address] for contact info under      Signed, Verne Carrow, MD, [facility] 10/09/2023 7:30 AM

## 2023-10-09 NOTE — Progress Notes (Signed)
      [address]       ,[place] [address]             [phone]    POD # 2 CABG x 4  Up in chair  BP (!) 146/85   Pulse 96   Temp 97.9 F (36.6 C) (Oral)   Resp 17   Ht 6\' 1"  (1.854 m)   Wt 110.7 kg   SpO2 98%   BMI 32.20 kg/m    Intake/Output Summary (Last 24 hours) at 10/09/2023 1750 Last data filed at 10/09/2023 1200 Gross per 24 hour  Intake 270.33 ml  Output 2180 ml  Net -1909.67 ml   Awaiting bed on tele  Viviann Spare C. Dorris Fetch, MD [facility] [phone]

## 2023-10-09 NOTE — Progress Notes (Signed)
CARDIAC REHAB PHASE I    Pt and RN about to begin walk in hallway. Pt reports tolerating ambulation well. Encouraged continued mobility and IS use. Will continue to follow.   [id number] Woodroe Chen, RN BSN 10/09/2023 1:40 PM

## 2023-10-10 ENCOUNTER — Inpatient Hospital Stay (HOSPITAL_COMMUNITY): Payer: 59

## 2023-10-10 DIAGNOSIS — I214 Non-ST elevation (NSTEMI) myocardial infarction: Secondary | ICD-10-CM | POA: Diagnosis not present

## 2023-10-10 LAB — TYPE AND SCREEN
ABO/RH(D): B POS
Antibody Screen: NEGATIVE
Unit division: 0
Unit division: 0

## 2023-10-10 LAB — CBC
HCT: 30.9 % — ABNORMAL LOW (ref 39.0–52.0)
Hemoglobin: 11.2 g/dL — ABNORMAL LOW (ref 13.0–17.0)
MCH: 30.7 pg (ref 26.0–34.0)
MCHC: 36.2 g/dL — ABNORMAL HIGH (ref 30.0–36.0)
MCV: 84.7 fL (ref 80.0–100.0)
Platelets: 153 10*3/uL (ref 150–400)
RBC: 3.65 MIL/uL — ABNORMAL LOW (ref 4.22–5.81)
RDW: 12.1 % (ref 11.5–15.5)
WBC: 9.1 10*3/uL (ref 4.0–10.5)
nRBC: 0 % (ref 0.0–0.2)

## 2023-10-10 LAB — GLUCOSE, CAPILLARY
Glucose-Capillary: 167 mg/dL — ABNORMAL HIGH (ref 70–99)
Glucose-Capillary: 192 mg/dL — ABNORMAL HIGH (ref 70–99)
Glucose-Capillary: 183 mg/dL — ABNORMAL HIGH (ref 70–99)
Glucose-Capillary: 235 mg/dL — ABNORMAL HIGH (ref 70–99)

## 2023-10-10 LAB — BASIC METABOLIC PANEL
Anion gap: 6 (ref 5–15)
BUN: 7 mg/dL (ref 6–20)
CO2: 23 mmol/L (ref 22–32)
Calcium: 7.8 mg/dL — ABNORMAL LOW (ref 8.9–10.3)
Chloride: 103 mmol/L (ref 98–111)
Creatinine, Ser: 0.84 mg/dL (ref 0.61–1.24)
GFR, Estimated: >60 mL/min (ref >60)
Glucose, Bld: 220 mg/dL — ABNORMAL HIGH (ref 70–99)
Potassium: 3.7 mmol/L (ref 3.5–5.1)
Sodium: 132 mmol/L — ABNORMAL LOW (ref 135–145)

## 2023-10-10 LAB — BPAM RBC
Blood Product Expiration Date: 202412142359
Blood Product Expiration Date: 202412142359
Unit Type and Rh: 7300
Unit Type and Rh: 7300

## 2023-10-10 MED ORDER — METOPROLOL TARTRATE 25 MG PO TABS
37.5000 mg | ORAL_TABLET | Freq: Two times a day (BID) | ORAL | Status: DC
  Administered 2023-10-10 (×2): 37.5 mg via ORAL
  Filled 2023-10-10 (×2): qty 1

## 2023-10-10 MED ORDER — INSULIN DETEMIR 100 UNIT/ML ~~LOC~~ SOLN
14.0000 [IU] | Freq: Two times a day (BID) | SUBCUTANEOUS | Status: DC
  Administered 2023-10-10 (×2): 14 [IU] via SUBCUTANEOUS
  Filled 2023-10-10 (×4): qty 0.14

## 2023-10-10 MED ORDER — POTASSIUM CHLORIDE CRYS ER 20 MEQ PO TBCR
20.0000 meq | EXTENDED_RELEASE_TABLET | Freq: Every day | ORAL | Status: DC
  Administered 2023-10-11 – 2023-10-12 (×2): 20 meq via ORAL
  Filled 2023-10-10 (×2): qty 1

## 2023-10-10 MED ORDER — POTASSIUM CHLORIDE CRYS ER 20 MEQ PO TBCR
30.0000 meq | EXTENDED_RELEASE_TABLET | Freq: Two times a day (BID) | ORAL | Status: AC
  Administered 2023-10-10 (×2): 30 meq via ORAL
  Filled 2023-10-10 (×2): qty 1

## 2023-10-10 MED ORDER — ZOLPIDEM TARTRATE 5 MG PO TABS: 5.0000 mg | ORAL_TABLET | Freq: Every evening | ORAL | Status: DC | PRN

## 2023-10-10 MED ORDER — ASPIRIN 81 MG PO CHEW
81.0000 mg | CHEWABLE_TABLET | Freq: Every day | ORAL | Status: DC
Start: 2023-10-10 — End: 2023-10-12

## 2023-10-10 MED ORDER — ASPIRIN 81 MG PO TBEC
81.0000 mg | DELAYED_RELEASE_TABLET | Freq: Every day | ORAL | Status: DC
Start: 2023-10-10 — End: 2023-10-12
  Administered 2023-10-10 – 2023-10-12 (×3): 81 mg via ORAL
  Filled 2023-10-10 (×3): qty 1

## 2023-10-10 NOTE — Progress Notes (Signed)
CARDIAC REHAB PHASE I   PRE:  Rate/Rhythm: 105 ST  BP:  Sitting: 142/91   SaO2: 98  MODE:  Ambulation: 470 ft   POST:  Rate/Rhythm: 105 ST  BP:  Sitting: 163/93     SaO2: 98  Pt amb in hallway with standby assist, tolerated well with no s/sx. Pt following sternal precautions well with transfers. Returned to recliner, IS used encouraged, all needs met and call bell in reach.  [id number]   Jonna Coup, MS, ACSM-CEP 10/10/2023 11:13 AM

## 2023-10-10 NOTE — Progress Notes (Signed)
   Patient Name: Edwin Ford Date of Encounter: 10/10/2023 [facility] Cardiologist: Christell Constant, MD   Interval Summary  .    No significant complaints  Vital Signs .    Vitals:   10/10/23 0840 10/10/23 0850 10/10/23 1025 10/10/23 1026  BP: 133/86  (!) 142/91 (!) 142/91  Pulse: 100     Resp: 20     Temp: 98.6 F (37 C)  99 F (37.2 C)   TempSrc: Oral  Oral   SpO2: 95%     Weight:  110 kg    Height:        Intake/Output Summary (Last 24 hours) at 10/10/2023 1034 Last data filed at 10/10/2023 0617 Gross per 24 hour  Intake --  Output 2875 ml  Net -2875 ml      10/10/2023    8:50 AM 10/10/2023    5:00 AM 10/09/2023    4:52 AM  Last 3 Weights  Weight (lbs) 242 lb 8 oz -- 244 lb 0.8 oz  Weight (kg) 109.997 kg -- 110.7 kg      Telemetry/ECG    Sinus rhythm- Personally Reviewed  Physical Exam .   GEN: No acute distress.   Neck: No JVD Cardiac: RRR, no murmurs, rubs, or gallops.  Respiratory: Clear to auscultation bilaterally. GI: Soft, nontender, non-distended  MS: No edema  Assessment & Plan .     42 year old male with non-STEMI, multivessel coronary disease postop day 3 following CABG -Progressing well as to be expected, sinus rhythm, aspirin Plavix statin beta-blocker given his concomitant non-ST elevation myocardial infarction  Continue to optimize type 2 diabetes hyperlipidemia-preop A1c 10.8 -Cardiac rehab when able, medications reviewed  Unfortunately homelessness influences social determinants of health.  For questions or updates, please contact  [facility] Please consult [email or web address] for contact info under        Signed, Donato Schultz, MD

## 2023-10-10 NOTE — Progress Notes (Addendum)
      [address]       [place] [address]             [phone]        3 Days Post-Op Procedure(s) (LRB): CORONARY ARTERY BYPASS GRAFTING X four, USING LEFT INTERNAL MAMMARY ARTERY AND ENDOSCOPICALLY HARVESTED RIGHT SAPHENOUS VEIN GRAFT (N/A) TRANSESOPHAGEAL ECHOCARDIOGRAM (TEE) (N/A)  Subjective: Patient states "care on this floor is different than ICU". He would like to wash up this am. He has no specific complaint  Objective: Vital signs in last 24 hours: Temp:  [97.9 F (36.6 C)-99.2 F (37.3 C)] 99.2 F (37.3 C) (11/22 2309) Pulse Rate:  [81-96] 86 (11/23 0500) Cardiac Rhythm: Normal sinus rhythm;Bundle branch block (11/22 2016) Resp:  [14-28] 20 (11/23 0307) BP: (128-149)/(76-117) 129/81 (11/23 0307) SpO2:  [95 %-100 %] 96 % (11/23 0500)  Pre op weight 110 kg Current Weight  10/09/23 110.7 kg      Intake/Output from previous day: 11/22 0701 - 11/23 0700 In: -  Out: 2875 [Urine:2875]   Physical Exam:  Cardiovascular: Slightly tachycardic this am. Pulmonary: Slightly diminished bibasilar breath sounds Abdomen: Soft, non tender, bowel sounds present. Extremities: Mild bilateral lower extremity edema. Wounds: Clean and dry.  No erythema or signs of infection.  Lab Results: CBC: Recent Labs    10/09/23 0443 10/10/23 0235  WBC 9.4 9.1  HGB 11.1* 11.2*  HCT 31.3* 30.9*  PLT 156 153   BMET:  Recent Labs    10/09/23 0443 10/10/23 0235  NA 134* 132*  K 3.7 3.7  CL 101 103  CO2 24 23  GLUCOSE 149* 220*  BUN 7 7  CREATININE 0.90 0.84  CALCIUM 8.1* 7.8*    PT/INR:  Lab Results  Component Value Date   INR 1.2 10/07/2023   INR 1.0 10/06/2023   ABG:  INR: Will add last result for INR, ABG once components are confirmed Will add last 4 CBG results once components are confirmed  Assessment/Plan:  1. CV - S/p NSTEMI. SR this am. On Lopressor 25 mg bid, Amlodipine 10 mg daily, and Plavix. Will increase Lopressor and decrease  ec asa to 81. 2.  Pulmonary - On room air. Check PA/LAT CXR. Encourage incentive spirometer. 3. Continue Lasix for edema 4.  Expected post op acute blood loss anemia - H and H this am stable at 11.2 and 30.9. 5. CBGs 175/177/167. Pre op HGA1C 10.8. On Insulin 10 units bid but will increase for better glucose. Will restart Januvia at discharge. He was also on Metformin XR but not taking as he could not afford. He will need to follow up with a medical doctor after discharge 6. Supplement potassium 7. He is homeless and lives in a hotel. Disposition to be determined Monday  Donielle M ZimmermanPA-C 7:38 AM  Patient seen and examined, agree with above Increase Lopressor for hypertension  Viviann Spare C. Dorris Fetch, MD [facility] [phone]

## 2023-10-10 NOTE — Plan of Care (Signed)
Problem: Education: Goal: Ability to describe self-care measures that may prevent or decrease complications (Diabetes Survival Skills Education) will improve Outcome: Progressing   Problem: Health Behavior/Discharge Planning: Goal: Ability to manage health-related needs will improve Outcome: Progressing   Problem: Nutritional: Goal: Maintenance of adequate nutrition will improve Outcome: Progressing

## 2023-10-11 LAB — GLUCOSE, CAPILLARY
Glucose-Capillary: 155 mg/dL — ABNORMAL HIGH (ref 70–99)
Glucose-Capillary: 182 mg/dL — ABNORMAL HIGH (ref 70–99)
Glucose-Capillary: 178 mg/dL — ABNORMAL HIGH (ref 70–99)
Glucose-Capillary: 146 mg/dL — ABNORMAL HIGH (ref 70–99)

## 2023-10-11 MED ORDER — METOPROLOL TARTRATE 50 MG PO TABS
50.0000 mg | ORAL_TABLET | Freq: Two times a day (BID) | ORAL | Status: DC
Start: 2023-10-11 — End: 2023-10-12
  Administered 2023-10-11 (×2): 50 mg via ORAL
  Filled 2023-10-11 (×2): qty 1

## 2023-10-11 MED ORDER — INSULIN DETEMIR 100 UNIT/ML ~~LOC~~ SOLN: 20.0000 [IU] | Freq: Two times a day (BID) | SUBCUTANEOUS | Status: DC

## 2023-10-11 MED ORDER — INSULIN DETEMIR 100 UNIT/ML ~~LOC~~ SOLN
18.0000 [IU] | Freq: Two times a day (BID) | SUBCUTANEOUS | Status: DC
  Administered 2023-10-11 – 2023-10-12 (×3): 18 [IU] via SUBCUTANEOUS
  Filled 2023-10-11 (×4): qty 0.18

## 2023-10-11 NOTE — Plan of Care (Signed)
Problem: Coping: Goal: Ability to adjust to condition or change in health will improve Outcome: Progressing   Problem: Metabolic: Goal: Ability to maintain appropriate glucose levels will improve Outcome: Progressing   Problem: Nutritional: Goal: Maintenance of adequate nutrition will improve Outcome: Progressing

## 2023-10-11 NOTE — Progress Notes (Addendum)
      [address]       [place] [address]             [phone]        4 Days Post-Op Procedure(s) (LRB): CORONARY ARTERY BYPASS GRAFTING X four, USING LEFT INTERNAL MAMMARY ARTERY AND ENDOSCOPICALLY HARVESTED RIGHT SAPHENOUS VEIN GRAFT (N/A) TRANSESOPHAGEAL ECHOCARDIOGRAM (TEE) (N/A)  Subjective: Patient brushing teeth this am. He walked several times yesterday.  Objective: Vital signs in last 24 hours: Temp:  [97.6 F (36.4 C)-100.2 F (37.9 C)] 99.2 F (37.3 C) (11/24 0250) Pulse Rate:  [81-105] 81 (11/24 0250) Cardiac Rhythm: Normal sinus rhythm (11/24 0718) Resp:  [18-22] 21 (11/24 0641) BP: (131-162)/(76-94) 131/80 (11/24 0250) SpO2:  [91 %-100 %] 93 % (11/24 0250) Weight:  [109.6 kg-110 kg] 109.6 kg (11/24 0641)  Pre op weight 110 kg Current Weight  10/11/23 109.6 kg      Intake/Output from previous day: 11/23 0701 - 11/24 0700 In: 600 [P.O.:600] Out: -    Physical Exam:  Cardiovascular: Tachycardic Pulmonary: Slightly diminished bibasilar breath sounds Abdomen: Soft, non tender, bowel sounds present. Extremities: Mild bilateral lower extremity edema. Wounds: Clean and dry.  No erythema or signs of infection.  Lab Results: CBC: Recent Labs    10/09/23 0443 10/10/23 0235  WBC 9.4 9.1  HGB 11.1* 11.2*  HCT 31.3* 30.9*  PLT 156 153   BMET:  Recent Labs    10/09/23 0443 10/10/23 0235  NA 134* 132*  K 3.7 3.7  CL 101 103  CO2 24 23  GLUCOSE 149* 220*  BUN 7 7  CREATININE 0.90 0.84  CALCIUM 8.1* 7.8*    PT/INR:  Lab Results  Component Value Date   INR 1.2 10/07/2023   INR 1.0 10/06/2023   ABG:  INR: Will add last result for INR, ABG once components are confirmed Will add last 4 CBG results once components are confirmed  Assessment/Plan:  1. CV - S/p NSTEMI. SR this am. On Lopressor 37.5 mg bid, Amlodipine 10 mg daily, and Plavix. Will increase Lopressor to 50 mg bid (was on 100 mg bid prior to surgery). 2.   Pulmonary - On room air. . Encourage incentive spirometer and flutter valve. 3. Continue Lasix for edema 4.  Expected post op acute blood loss anemia - H and H this am stable at 11.2 and 30.9. 5. CBGs 183/235/155. Pre op HGA1C 10.8. On Insulin 14 units bid but will increase for better glucose. Will restart Januvia at discharge. He was also on Metformin XR but not taking as he could not afford. He will need to follow up with a medical doctor after discharge 6. Fever to 100.2 yesterday. No sign of wound infection, no GU complaint. Using incentive spirometer several times daily. Monitor 7. He is homeless and lives in a hotel. Disposition to be determined Monday  Donielle M ZimmermanPA-C 7:34 AM  Patient seen and examined, agree with findings and plan outlined above  Viviann Spare C. Dorris Fetch, MD [facility] [phone]

## 2023-10-11 NOTE — Plan of Care (Signed)
Problem: Coping: Goal: Ability to adjust to condition or change in health will improve Outcome: Progressing   Problem: Metabolic: Goal: Ability to maintain appropriate glucose levels will improve Outcome: Progressing

## 2023-10-12 ENCOUNTER — Telehealth (HOSPITAL_COMMUNITY): Payer: Self-pay | Admitting: Pharmacy Technician

## 2023-10-12 ENCOUNTER — Other Ambulatory Visit (HOSPITAL_COMMUNITY): Payer: Self-pay

## 2023-10-12 DIAGNOSIS — I214 Non-ST elevation (NSTEMI) myocardial infarction: Secondary | ICD-10-CM | POA: Diagnosis not present

## 2023-10-12 LAB — GLUCOSE, CAPILLARY
Glucose-Capillary: 239 mg/dL — ABNORMAL HIGH (ref 70–99)
Glucose-Capillary: 135 mg/dL — ABNORMAL HIGH (ref 70–99)
Glucose-Capillary: 227 mg/dL — ABNORMAL HIGH (ref 70–99)
Glucose-Capillary: 271 mg/dL — ABNORMAL HIGH (ref 70–99)

## 2023-10-12 MED ORDER — OXYCODONE HCL 5 MG PO TABS
5.0000 mg | ORAL_TABLET | Freq: Four times a day (QID) | ORAL | 0 refills | Status: DC | PRN
  Filled 2023-10-12: qty 28, 7d supply, fill #0

## 2023-10-12 MED ORDER — LISINOPRIL 20 MG PO TABS
20.0000 mg | ORAL_TABLET | Freq: Every day | ORAL | Status: DC
  Administered 2023-10-12: 20 mg via ORAL
  Filled 2023-10-12: qty 1

## 2023-10-12 MED ORDER — METOPROLOL TARTRATE 50 MG PO TABS
50.0000 mg | ORAL_TABLET | Freq: Two times a day (BID) | ORAL | 5 refills | Status: DC
  Filled 2023-10-12: qty 60, 30d supply, fill #0

## 2023-10-12 MED ORDER — METOPROLOL TARTRATE 50 MG PO TABS
50.0000 mg | ORAL_TABLET | Freq: Two times a day (BID) | ORAL | Status: DC
  Administered 2023-10-12: 50 mg via ORAL
  Filled 2023-10-12: qty 1

## 2023-10-12 MED ORDER — EZETIMIBE 10 MG PO TABS
10.0000 mg | ORAL_TABLET | Freq: Every day | ORAL | 1 refills | Status: DC
  Filled 2023-10-12: qty 30, 30d supply, fill #0

## 2023-10-12 MED ORDER — ATORVASTATIN CALCIUM 80 MG PO TABS
80.0000 mg | ORAL_TABLET | Freq: Every day | ORAL | 1 refills | Status: DC
  Filled 2023-10-12: qty 30, 30d supply, fill #0

## 2023-10-12 MED ORDER — NICOTINE 14 MG/24HR TD PT24: 14.0000 mg | MEDICATED_PATCH | Freq: Every day | TRANSDERMAL | Status: DC

## 2023-10-12 MED ORDER — INSULIN PEN NEEDLE 32G X 4 MM MISC
Freq: Every day | 0 refills | Status: DC
  Filled 2023-10-12: qty 100, 50d supply, fill #0

## 2023-10-12 MED ORDER — LISINOPRIL 20 MG PO TABS
20.0000 mg | ORAL_TABLET | Freq: Every day | ORAL | 5 refills | Status: DC
  Filled 2023-10-12: qty 30, 30d supply, fill #0

## 2023-10-12 MED ORDER — CLOPIDOGREL BISULFATE 75 MG PO TABS
75.0000 mg | ORAL_TABLET | Freq: Every day | ORAL | 1 refills | Status: DC
  Filled 2023-10-12 (×2): qty 30, 30d supply, fill #0

## 2023-10-12 MED ORDER — ASPIRIN 81 MG PO TBEC: 81.0000 mg | DELAYED_RELEASE_TABLET | Freq: Every day | ORAL | Status: DC

## 2023-10-12 MED ORDER — METOPROLOL TARTRATE 100 MG PO TABS: 100.0000 mg | ORAL_TABLET | Freq: Two times a day (BID) | ORAL | Status: DC

## 2023-10-12 MED ORDER — INSULIN ASPART PROT & ASPART (70-30 MIX) 100 UNIT/ML PEN
15.0000 [IU] | PEN_INJECTOR | Freq: Two times a day (BID) | SUBCUTANEOUS | 20 refills | Status: DC
Start: 2023-10-12 — End: 2023-10-30
  Filled 2023-10-12: qty 6, 20d supply, fill #0

## 2023-10-12 NOTE — Telephone Encounter (Signed)
Patient Product/process development scientist completed.    The patient is insured through [facility]. Patient has [employer], may use a copay card, and/or apply for patient assistance if available.    Ran test claim for Basalgar Pen and the current 30 day co-pay is $304.05 due to a $7500 deductible.  Ran test claim for Lantus Pen and Non Formualary  This test claim was processed through [employer]- copay amounts may vary at other pharmacies due to [employer], or as the patient moves through the different stages of their insurance plan.     Roland Earl, CPHT Pharmacy Technician III Certified Patient Advocate [facility] Patient Advocate Team Direct Number: [phone]  Fax: [phone]

## 2023-10-12 NOTE — Progress Notes (Signed)
   Patient Name: Edwin Ford Date of Encounter: 10/12/2023 [facility] Cardiologist: Christell Constant, MD   Interval Summary  .    Doing well this morning.  No chest pain or shortness of breath  Vital Signs .    Vitals:   10/11/23 2258 10/12/23 0555 10/12/23 0836 10/12/23 1152  BP: (!) 140/81 (!) 156/84 (!) 153/89 (!) 140/85  Pulse:  83 (!) 103 85  Resp: 20 20 20 18   Temp: 98.7 F (37.1 C) 98.6 F (37 C) 98.2 F (36.8 C) 98.4 F (36.9 C)  TempSrc: Oral Oral Oral Oral  SpO2: 97% 100% 97% 97%  Weight:  107.7 kg    Height:        Intake/Output Summary (Last 24 hours) at 10/12/2023 1154 Last data filed at 10/12/2023 0837 Gross per 24 hour  Intake 480 ml  Output --  Net 480 ml      10/12/2023    5:55 AM 10/11/2023    6:41 AM 10/10/2023    8:50 AM  Last 3 Weights  Weight (lbs) 237 lb 8 oz 241 lb 9.6 oz 242 lb 8 oz  Weight (kg) 107.729 kg 109.589 kg 109.997 kg      Telemetry/ECG    Sinus rhythm without significant arrhythmia.  No atrial fibrillation - Personally Reviewed  Physical Exam .   GEN: No acute distress.   Neck: No JVD Cardiac: RRR, no murmurs, rubs, or gallops.  Respiratory: Clear to auscultation bilaterally. GI: Soft, nontender, non-distended  MS: No edema  Assessment & Plan .     Non-STEMI: Status post CABG.  Reviewed notes from surgical team with plan to increase metoprolol and restart lisinopril.  He is on DAPT with aspirin and clopidogrel as well as high intensity statin drug.  Overall he appears to be progressing well.  Appears medically stable for discharge in the near future.  Will arrange outpatient cardiology follow-up.  For questions or updates, please contact [facility] Please consult [email or web address] for contact info under        Signed, Tonny Bollman, MD

## 2023-10-12 NOTE — Progress Notes (Signed)
CARDIAC REHAB PHASE I   PRE:  Rate/Rhythm: 101 ST    BP: sitting 153/89    SpO2: 98 RA  MODE:  Ambulation: 1410 ft   POST:  Rate/Rhythm: 105 ST    BP: sitting 149/97     SpO2: 98 RA  Pt moving independently with appropriate mechanics. No c/o ambulating long distance. To recliner.   Discussed with pt IS (2200 ml currently), flutter valve for congestion, sternal precautions, smoking cessation, diet, exercise, and CRPII. Pt very receptive, wanting to make change. He plans to d/c on 14 mg nicotine patch and would benefit from prescription. Will refer to [facility] CRPII. Gave d/c video to view. [phone]   Ethelda Chick BS, ACSM-CEP 10/12/2023 9:59 AM

## 2023-10-12 NOTE — Progress Notes (Addendum)
      [address]       [place] [address]             [phone]        5 Days Post-Op Procedure(s) (LRB): CORONARY ARTERY BYPASS GRAFTING X four, USING LEFT INTERNAL MAMMARY ARTERY AND ENDOSCOPICALLY HARVESTED RIGHT SAPHENOUS VEIN GRAFT (N/A) TRANSESOPHAGEAL ECHOCARDIOGRAM (TEE) (N/A)  Subjective: Out of bed walking around in his room.  Feels he is progressing, no new concerns. BM this morning.  On RA.   Objective: Vital signs in last 24 hours: Temp:  [98.6 F (37 C)-99.6 F (37.6 C)] 98.6 F (37 C) (11/25 0555) Pulse Rate:  [83-106] 83 (11/25 0555) Cardiac Rhythm: Sinus tachycardia;Bundle branch block (11/24 1901) Resp:  [20-24] 20 (11/25 0555) BP: (140-174)/(81-94) 156/84 (11/25 0555) SpO2:  [97 %-100 %] 100 % (11/25 0555) Weight:  [107.7 kg] 107.7 kg (11/25 0555)  Pre op weight 110 kg Current Weight  10/12/23 107.7 kg      Intake/Output from previous day: 11/24 0701 - 11/25 0700 In: 240 [P.O.:240] Out: -    Physical Exam:  Cardiovascular: RRR, rate 90's-100 Pulmonary: breath sounds coarse in bases Abdomen: Soft, non tender, bowel sounds present. Extremities: Mild bilateral lower extremity edema. Wounds: Clean and dry.  No erythema or signs of infection.  Lab Results: CBC: Recent Labs    10/10/23 0235  WBC 9.1  HGB 11.2*  HCT 30.9*  PLT 153   BMET:  Recent Labs    10/10/23 0235  NA 132*  K 3.7  CL 103  CO2 23  GLUCOSE 220*  BUN 7  CREATININE 0.84  CALCIUM 7.8*    PT/INR:  Lab Results  Component Value Date   INR 1.2 10/07/2023   INR 1.0 10/06/2023   ABG:  INR: Will add last result for INR, ABG once components are confirmed Will add last 4 CBG results once components are confirmed  Assessment/Plan:   -POD5 CABG x 4 after presenting with NSTEMI, EF 50-55%. BP 150-170, HR 90's -100. Will increase the metoprolol to 50mg  BID and resume lisinopril. Continue Plavix, ASA, statin  - Pulmonary - On room air. .  Encouraged to continue working on [employer].  - Renal-normal function, Wt is below pre-op. Continue Lasix for edema for a few more days.   -Heme-Expected post op acute blood loss anemia   -ENDO-  Type 2 DM poorly controlled prior to admission. Patient says he was unable to afford medications.   HGA1C 10.8. CBG's 130'2-18- past 24 hours On Levemir 18 units BID . Will restart Januvia at discharge. He was also on Metformin XR but not taking as he could not afford. He will need to follow up with a medical doctor after discharge. Requested consult from diabetes coordinator.   -Disposition- He is homeless and living in a hotel prior to admission. [place] Spaniel he will be living with family when he leaves the hospital.   Dale Hydro 7:41 AM   Agree with above Doing well Dispo planning  Maggy Wyble Keane Scrape

## 2023-10-13 MED FILL — Lidocaine HCl Local Preservative Free (PF) Inj 2%: INTRAMUSCULAR | Qty: 14 | Status: AC

## 2023-10-13 MED FILL — Heparin Sodium (Porcine) Inj 1000 Unit/ML: Qty: 1000 | Status: AC

## 2023-10-13 MED FILL — Potassium Chloride Inj 2 mEq/ML: INTRAVENOUS | Qty: 20 | Status: AC

## 2023-10-14 MED FILL — Electrolyte-R (PH 7.4) Solution: INTRAVENOUS | Qty: 3000 | Status: AC

## 2023-10-14 MED FILL — Sodium Bicarbonate IV Soln 8.4%: INTRAVENOUS | Qty: 50 | Status: AC

## 2023-10-14 MED FILL — Calcium Chloride Inj 10%: INTRAVENOUS | Qty: 10 | Status: AC

## 2023-10-14 MED FILL — Mannitol IV Soln 20%: INTRAVENOUS | Qty: 500 | Status: AC

## 2023-10-14 MED FILL — Heparin Sodium (Porcine) Inj 1000 Unit/ML: INTRAMUSCULAR | Qty: 30 | Status: AC

## 2023-10-14 MED FILL — Sodium Chloride IV Soln 0.9%: INTRAVENOUS | Qty: 2000 | Status: AC

## 2023-10-23 ENCOUNTER — Ambulatory Visit (INDEPENDENT_AMBULATORY_CARE_PROVIDER_SITE_OTHER): Payer: 59 | Admitting: Thoracic Surgery (Cardiothoracic Vascular Surgery)

## 2023-10-23 DIAGNOSIS — Z951 Presence of aortocoronary bypass graft: Secondary | ICD-10-CM

## 2023-10-23 NOTE — Progress Notes (Signed)
     [address]       [place] [address]             [phone]       Patient: Home Provider: Office Consent for Telemedicine visit obtained.  Today's visit was completed via a real-time telehealth (see specific modality noted below). The patient/authorized person provided oral consent at the time of the visit to engage in a telemedicine encounter with the present provider at [facility]. The patient/authorized person was informed of the potential benefits, limitations, and risks of telemedicine. The patient/authorized person expressed understanding that the laws that protect confidentiality also apply to telemedicine. The patient/authorized person acknowledged understanding that telemedicine does not provide emergency services and that he or she would need to call 911 or proceed to the nearest hospital for help if such a need arose.   Total time spent in the clinical discussion 10 minutes.  Telehealth Modality: Phone visit (audio only)  I had a telephone visit with  [name] who is s/p CABG.  Overall doing well.  Pain is minimal.  Ambulating well. Vitals have been stable.  [name] will see [place] back in 1 month with a chest x-ray for cardiac rehab clearance.  Martie Muhlbauer Keane Scrape

## 2023-10-29 NOTE — Progress Notes (Signed)
Cardiology Office Note:    Date:  10/30/2023  ID:  Edwin Ford, DOB 1981/09/27, MRN [id number] PCP: Bess Kinds, MD  [facility] Providers Cardiologist:  Christell Constant, MD       Patient Profile:      Coronary artery disease NSTEMI s/p CABG (L-LAD, S-PDA, S-OM, S-RI) in 09/2023 [facility] 10/03/23: RI 99, pLAD 70, D1 80, D2 60, pLCx 80, mLCx 70, LPL1 100, mRCA 75, dRCA 55, 99, RPAV 95 TTE 10/03/23: EF 50-55, abn ant and ant-lat strain, no RWMA mild asymmetric of inf segment, Gr 2 DD, GLS -13.6, NL RVSF, mod RVE, trivial MR, RAP 3  Hypertension  Hyperlipidemia  Diabetes mellitus  Tobacco use  Past cocaine use  FHx CAD  GERD  Asthma  Carotid US 10/06/23: no ICA stenosis Un-housed        History of Present Illness:  Discussed the use of AI scribe software for clinical note transcription with the patient, who gave verbal consent to proceed.  Edwin Ford is a 42 y.o. male who returns for post hospital follow up. He was admitted 11/16-11/25 with a NSTEMI. Cardiac catheterization demonstrated multivessel CAD and he underwent CABG with Dr. Cliffton Asters. Post op course was fairly uneventful. His preop Hgb A1c was 10.8. He was followed by the diabetes coordinator and started on NovoLog 70/30. Of note, he was on Januvia+Insulin prior to admission. He was DC on DAPT given presentation with ACS.   He is here alone today. He is currently living with his cousin. He is not sure about his meds. He has not had to get any refills. He is concerned about cost. He works in Human resources officer. He just started his current job 4 mos ago. He is concerned he will lose his job. I advised him to apply for FMLA. He is still sore but getting better, slowly. He has not had shortness of breath, syncope. He has not had fever. He has a good appetite and normal bowel movements. He has quit smoking.        Review of Systems  Constitutional: Negative for fever.  Respiratory:  Negative for cough.       See HPI    Studies Reviewed:   EKG Interpretation Date/Time:  Friday October 30 2023 11:07:26 EST Ventricular Rate:  74 PR Interval:  122 QRS Duration:  136 QT Interval:  400 QTC Calculation: 444 R Axis:   80  Text Interpretation: Normal sinus rhythm Right bundle branch block No significant change since last tracing Confirmed by Tereso Newcomer [phone]) on 10/30/2023 11:22:01 AM          Risk Assessment/Calculations:     HYPERTENSION CONTROL Vitals:   10/30/23 1103 10/30/23 1232  BP: (!) 143/80 (!) 150/90    The patient's blood pressure is elevated above target today.  In order to address the patient's elevated BP: A new medication was prescribed today.          Physical Exam:   VS:  BP (!) 150/90   Pulse 74   Ht 6' (1.829 m)   Wt 254 lb 3.2 oz (115.3 kg)   SpO2 98%   BMI 34.48 kg/m    Wt Readings from Last 3 Encounters:  10/30/23 254 lb 3.2 oz (115.3 kg)  10/12/23 237 lb 8 oz (107.7 kg)  05/30/23 250 lb (113.4 kg)    Constitutional:      Appearance: Healthy appearance. Not in distress.  Neck:     Vascular: JVD normal.  Pulmonary:     Breath sounds: Normal breath sounds. No wheezing. No rales.  Cardiovascular:     Normal rate. Regular rhythm.     Murmurs: There is no murmur.     No rub.     Comments: Median sternotomy well healed w/o erythema or d/c. Edema:    Peripheral edema absent.  Abdominal:     Palpations: Abdomen is soft.  Skin:    General: Skin is warm and dry.        Assessment and Plan:   Assessment & Plan NSTEMI (non-ST elevated myocardial infarction) (HCC) S/p CABG. He seems to be progressing well. Unfortunately, he is un housed. He is currently living with a cousin. He has insurance currently through his job but just started working there 4 mos ago. He is not sure he can afford meds and is worried about losing his job. He is not sure he can afford cardiac rehabilitation. I reached out to our social work team to see if there is any  assistance we can provide him through our patient care fund. We were able to check on his meds and, with his insurance, his copays are $0. He is not currently taking ASA. He did not know he was supposed to take it. We discussed the importance of taking ASA + Clopidogrel for 1 year post ACS, s/p CABG. -Start ASA 81 mg once daily -Continue Atorvastatin 80 mg once daily, Clopidogrel 75 mg once daily -Start CRH after cleared by CT surgery (if he is able) -Follow up 3 mos Essential hypertension, benign BP above target. He does not know his meds. I don't think he is taking Amlodipine. As noted, his copay for his meds is $0. Will get him back on Amlodipine which should help get his BP to goal.  -Continue Lisinopril 20 mg once daily, Metoprolol 50 mg twice daily -Start Amlodipine 10 mg once daily  Pure hypercholesterolemia Goal LDL < 55. Continue Atorvastatin 80 mg once daily, Zetia 10 mg once daily. Will arrange Lipids, LFTs at next OV. Type 2 diabetes mellitus with complication (HCC) I have encouraged him to make an appt with primary care at [facility] OP clinics. Continue NovoLog. I refilled this for him until he can get an appt.       Cardiac Rehabilitation Eligibility Assessment  The patient is ready to start cardiac rehabilitation pending clearance from the cardiac surgeon.     Dispo:  Return in about 3 months (around 01/28/2024) for Routine Follow Up, w/ Dr. Izora Ribas, or Tereso Newcomer, PA-C.  Signed, Tereso Newcomer, PA-C

## 2023-10-30 ENCOUNTER — Encounter: Payer: Self-pay | Admitting: Physician Assistant

## 2023-10-30 ENCOUNTER — Telehealth: Payer: Self-pay | Admitting: Internal Medicine

## 2023-10-30 ENCOUNTER — Other Ambulatory Visit (HOSPITAL_COMMUNITY): Payer: Self-pay

## 2023-10-30 ENCOUNTER — Ambulatory Visit: Payer: 59 | Attending: Physician Assistant | Admitting: Physician Assistant

## 2023-10-30 VITALS — BP 150/90 | HR 74 | Ht 72.0 in | Wt 254.2 lb

## 2023-10-30 DIAGNOSIS — E118 Type 2 diabetes mellitus with unspecified complications: Secondary | ICD-10-CM

## 2023-10-30 DIAGNOSIS — I214 Non-ST elevation (NSTEMI) myocardial infarction: Secondary | ICD-10-CM

## 2023-10-30 DIAGNOSIS — E7800 Pure hypercholesterolemia, unspecified: Secondary | ICD-10-CM

## 2023-10-30 DIAGNOSIS — I10 Essential (primary) hypertension: Secondary | ICD-10-CM

## 2023-10-30 MED ORDER — METOPROLOL TARTRATE 50 MG PO TABS
50.0000 mg | ORAL_TABLET | Freq: Two times a day (BID) | ORAL | 3 refills | Status: DC
  Filled 2023-10-30 – 2023-11-13 (×2): qty 180, 90d supply, fill #0
  Filled 2024-02-06: qty 180, 90d supply, fill #1
  Filled 2024-05-06: qty 180, 90d supply, fill #2
  Filled 2024-08-04: qty 180, 90d supply, fill #3

## 2023-10-30 MED ORDER — INSULIN ASPART PROT & ASPART (70-30 MIX) 100 UNIT/ML PEN
15.0000 [IU] | PEN_INJECTOR | Freq: Two times a day (BID) | SUBCUTANEOUS | 0 refills | Status: DC
  Filled 2023-10-30 (×2): qty 9, 30d supply, fill #0

## 2023-10-30 MED ORDER — ATORVASTATIN CALCIUM 80 MG PO TABS
80.0000 mg | ORAL_TABLET | Freq: Every day | ORAL | 3 refills | Status: DC
  Filled 2023-10-30 – 2023-11-13 (×2): qty 90, 90d supply, fill #0
  Filled 2024-02-06: qty 90, 90d supply, fill #1

## 2023-10-30 MED ORDER — CLOPIDOGREL BISULFATE 75 MG PO TABS
75.0000 mg | ORAL_TABLET | Freq: Every day | ORAL | 3 refills | Status: DC
  Filled 2023-10-30 – 2023-11-13 (×2): qty 90, 90d supply, fill #0
  Filled 2024-02-06: qty 90, 90d supply, fill #1
  Filled 2024-05-06: qty 90, 90d supply, fill #2
  Filled 2024-08-04: qty 90, 90d supply, fill #3

## 2023-10-30 MED ORDER — ASPIRIN 81 MG PO TBEC
81.0000 mg | DELAYED_RELEASE_TABLET | Freq: Every day | ORAL | 12 refills | Status: AC
  Filled 2023-10-30: qty 30, 30d supply, fill #0
  Filled 2023-11-24: qty 30, 30d supply, fill #1
  Filled 2023-12-24: qty 30, 30d supply, fill #2
  Filled 2024-01-23: qty 30, 30d supply, fill #3

## 2023-10-30 MED ORDER — LISINOPRIL 20 MG PO TABS
20.0000 mg | ORAL_TABLET | Freq: Every day | ORAL | 3 refills | Status: DC
  Filled 2023-10-30 – 2023-11-13 (×2): qty 90, 90d supply, fill #0

## 2023-10-30 MED ORDER — AMLODIPINE BESYLATE 10 MG PO TABS
10.0000 mg | ORAL_TABLET | Freq: Every day | ORAL | 3 refills | Status: DC
  Filled 2023-10-30: qty 90, 90d supply, fill #0
  Filled 2024-01-23: qty 90, 90d supply, fill #1
  Filled 2024-04-26: qty 90, 90d supply, fill #2
  Filled 2024-07-29: qty 90, 90d supply, fill #3
  Filled 2024-10-29: qty 90, 90d supply, fill #4

## 2023-10-30 MED ORDER — EZETIMIBE 10 MG PO TABS
10.0000 mg | ORAL_TABLET | Freq: Every day | ORAL | 3 refills | Status: DC
  Filled 2023-10-30 – 2023-11-13 (×2): qty 90, 90d supply, fill #0
  Filled 2024-02-06: qty 90, 90d supply, fill #1
  Filled 2024-05-06: qty 90, 90d supply, fill #2
  Filled 2024-08-04: qty 90, 90d supply, fill #3

## 2023-10-30 NOTE — Assessment & Plan Note (Signed)
Goal LDL < 55. Continue Atorvastatin 80 mg once daily, Zetia 10 mg once daily. Will arrange Lipids, LFTs at next OV.

## 2023-10-30 NOTE — Assessment & Plan Note (Signed)
S/p CABG. He seems to be progressing well. Unfortunately, he is un housed. He is currently living with a cousin. He has insurance currently through his job but just started working there 4 mos ago. He is not sure he can afford meds and is worried about losing his job. He is not sure he can afford cardiac rehabilitation. I reached out to our social work team to see if there is any assistance we can provide him through our patient care fund. We were able to check on his meds and, with his insurance, his copays are $0. He is not currently taking ASA. He did not know he was supposed to take it. We discussed the importance of taking ASA + Clopidogrel for 1 year post ACS, s/p CABG. -Start ASA 81 mg once daily -Continue Atorvastatin 80 mg once daily, Clopidogrel 75 mg once daily -Start CRH after cleared by CT surgery (if he is able) -Follow up 3 mos

## 2023-10-30 NOTE — Assessment & Plan Note (Signed)
BP above target. He does not know his meds. I don't think he is taking Amlodipine. As noted, his copay for his meds is $0. Will get him back on Amlodipine which should help get his BP to goal.  -Continue Lisinopril 20 mg once daily, Metoprolol 50 mg twice daily -Start Amlodipine 10 mg once daily

## 2023-10-30 NOTE — Telephone Encounter (Signed)
Patient verified the medications he is taking. Med list is up to date. Will forward to Tereso Newcomer, PA-C for review.

## 2023-10-30 NOTE — Assessment & Plan Note (Signed)
I have encouraged him to make an appt with primary care at [facility] OP clinics. Continue NovoLog. I refilled this for him until he can get an appt.

## 2023-10-30 NOTE — Telephone Encounter (Signed)
Patient calling in to verify his medications he takes  amLODipine (NORVASC) 10 MG tablet   aspirin EC 81 MG tablet    aspirin EC 81 MG tablet    clopidogrel (PLAVIX) 75 MG tablet    ezetimibe (ZETIA) 10 MG tablet    lisinopril (ZESTRIL) 20 MG tablet    metoprolol tartrate (LOPRESSOR) 50 MG tablet

## 2023-10-30 NOTE — Patient Instructions (Signed)
Medication Instructions:  Your physician recommends that you continue on your current medications as directed. Please refer to the Current Medication list given to you today.  *If you need a refill on your cardiac medications before your next appointment, please call your pharmacy*   Lab Work: None ordered  If you have labs (blood work) drawn today and your tests are completely normal, you will receive your results only by: MyChart Message (if you have MyChart) OR A paper copy in the mail If you have any lab test that is abnormal or we need to change your treatment, we will call you to review the results.   Testing/Procedures: None ordered   Follow-Up: At [facility], you and your health needs are our priority.  As part of our continuing mission to provide you with exceptional heart care, we have created designated Provider Care Teams.  These Care Teams include your primary Cardiologist (physician) and Advanced Practice Providers (APPs -  Physician Assistants and Nurse Practitioners) who all work together to provide you with the care you need, when you need it.  We recommend signing up for the patient portal called "MyChart".  Sign up information is provided on this After Visit Summary.  MyChart is used to connect with patients for Virtual Visits (Telemedicine).  Patients are able to view lab/test results, encounter notes, upcoming appointments, etc.  Non-urgent messages can be sent to your provider as well.   To learn more about what you can do with MyChart, go to [email or web address].    Your next appointment:   3 month(s)  Provider:   Christell Constant, MD  or Tereso Newcomer, PA-C         Other Instructions SEND A [facility] MESSAGE WITH WHAT MEDICATIONS YOU ARE TAKING

## 2023-11-05 ENCOUNTER — Telehealth (HOSPITAL_COMMUNITY): Payer: Self-pay | Admitting: Licensed Clinical Social Worker

## 2023-11-05 NOTE — Telephone Encounter (Signed)
CSW informed of patient concerns regarding housing and potential loss of his job- CSW reached out to discuss- unable to reach- left VM requesting return call  Burna Sis, LCSW Clinical Social Worker [facility] Desk#: [phone] Cell#: [phone]

## 2023-11-13 ENCOUNTER — Other Ambulatory Visit (HOSPITAL_COMMUNITY): Payer: Self-pay

## 2023-11-13 NOTE — Progress Notes (Signed)
 [address]       [place] [address]             [phone]    HPI: Edwin Ford is a 42 yo obese male with history of HTN, DM, polysubstance abuse (cocaine, marijuana), nicotine  abuse, and depression. The patient returns for routine postoperative follow-up having undergone CABG x 4 by Dr. Shyrl on 10/07/23. The patient's early postoperative recovery while in the hospital was notable for uncontrolled diabetes requiring close follow up. He otherwise had a relatively routine postoperative hospital stay. He saw cardiology on 12/13 and was doing well but was worried about the cost of his medications and that he may lose his new job, he is also unhoused living with his cousin. Cardiology check his medications and he has $0 copay for his medications and social work is looking into assistance for cardiac rehab.  Since hospital discharge the patient reports chest soreness but he is not taking any pain medication. He is ambulating well and denies shortness of breath, dizziness and LOC. He does admit he is eager to go back to work but does not want to be cleared until he is ready to go back full duty because he is afraid he will not follow light duty instruction. 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   Current Outpatient Medications  Medication Sig Dispense Refill   albuterol  (PROVENTIL  HFA) 108 (90 Base) MCG/ACT inhaler INHALE 2 PUFFS BY MOUTH EVERY 6 HOURS AS NEEDED FOR COUGHING, WHEEZING, OR SHORTNESS OF BREATH 1 each 0   alprazolam  (XANAX ) 2 MG tablet Take 2 mg by mouth 3 (three) times daily as needed for sleep.     amLODipine  (NORVASC ) 10 MG tablet Take 1 tablet (10 mg total) by mouth daily. 180 tablet 3   aspirin  EC 81 MG tablet Take 1 tablet (81 mg total) by mouth daily. Swallow whole. 30 tablet 12   atorvastatin  (LIPITOR) 80 MG tablet Take 1 tablet (80 mg total) by mouth daily. 90 tablet 3   clopidogrel  (PLAVIX ) 75 MG tablet Take 1 tablet  (75 mg total) by mouth daily. 90 tablet 3   ezetimibe  (ZETIA ) 10 MG tablet Take 1 tablet (10 mg total) by mouth daily. 90 tablet 3   insulin  aspart protamine  - aspart (NOVOLOG  70/30 MIX) (70-30) 100 UNIT/ML FlexPen Inject 15 Units into the skin 2 (two) times daily with a meal. 9 mL 0   lisinopril  (ZESTRIL ) 20 MG tablet Take 1 tablet (20 mg total) by mouth daily. 90 tablet 3   metoprolol  tartrate (LOPRESSOR ) 50 MG tablet Take 1 tablet (50 mg total) by mouth 2 (two) times daily. 180 tablet 3   oxyCODONE  (OXY IR/ROXICODONE ) 5 MG immediate release tablet Take 1 tablet (5 mg total) by mouth every 6 (six) hours as needed for severe pain (pain score 7-10). 30 tablet 0   No current facility-administered medications for this visit.   Vitals: Today's Vitals   11/25/23 1432  BP: (!) 159/99 L arm  Pulse: 78  Resp: 18  SpO2: 98%  Weight: 256 lb (116.1 kg)  Height: 6' (1.829 m)   Body mass index is 34.72 kg/m. 143/93 on R arm  Physical Exam: General: Alert and oriented, no acute distress Neuro: Grossly intact CV: Regular rate and rhythm, no murmur Pulm: Clear to auscultation bilaterally GI: +BS, nontender, no distension Extremities: No edema Wound: Clean and dry and healing well without sign of infection  Diagnostic  Tests: CLINICAL DATA:  Post CABG history of hypertension   EXAM: CHEST - 2 VIEW   COMPARISON:  10/10/2023   FINDINGS: Post sternotomy changes. No acute airspace disease or effusion. Normal cardiac size. No pneumothorax   IMPRESSION: No active cardiopulmonary disease.     Electronically Signed   By: Luke Bun M.D.   On: 11/25/2023 15:18  Impression/Plan: S/P CABG: The patient is progressing well from surgery. He is eager to go back to work but when offered to go back light duty he declined and stated he wanted to wait until he is fully cleared because he is afraid he will do something he shouldn't. He will be cleared for work at 12 weeks. He has some chest  soreness but is not taking any pain medication. He denies shortness of breath and CXR shows no active cardiopulmonary disease, he is ambulating well. I will increase Lisinopril  for HTN but will continue all other medications. His incisions are healing well. We reviewed continued sternal precautions. I cleared him to drive and for cardiac rehab today. Plan to have to patient return to the clinic as needed.  Tobacco abuse: The patient is still smoking but is trying to quit. We discussed the importance of quitting and reviewed strategies to help him quit.  HTN: Blood pressure is elevated at 159/99 and 143/93 today. It was elevated at 150/90 during his cardiology appointment last month. He has been taking all of his medications as instructed. Since these medications have a $0 copay I do not want to change any medications at this time since he is worried about being able to afford them. I will instead increase his Lisinopril  to 40mg  daily. The patient has a cardiology follow up in March.   Edwin JAYSON Helm, PA-C [facility] [phone]

## 2023-11-17 ENCOUNTER — Telehealth (HOSPITAL_COMMUNITY): Payer: Self-pay | Admitting: Licensed Clinical Social Worker

## 2023-11-17 NOTE — Telephone Encounter (Signed)
CSW attempted to call patient to discuss SDOH concerns- unable to reach- let VM requesting return call  Burna Sis, LCSW Clinical Social Worker [facility] Desk#: [phone] Cell#: [phone]

## 2023-11-24 ENCOUNTER — Other Ambulatory Visit: Payer: Self-pay

## 2023-11-24 ENCOUNTER — Other Ambulatory Visit: Payer: Self-pay | Admitting: Thoracic Surgery (Cardiothoracic Vascular Surgery)

## 2023-11-24 DIAGNOSIS — Z951 Presence of aortocoronary bypass graft: Secondary | ICD-10-CM

## 2023-11-25 ENCOUNTER — Ambulatory Visit (INDEPENDENT_AMBULATORY_CARE_PROVIDER_SITE_OTHER): Payer: Self-pay | Admitting: Physician Assistant

## 2023-11-25 ENCOUNTER — Encounter: Payer: Self-pay | Admitting: Physician Assistant

## 2023-11-25 ENCOUNTER — Ambulatory Visit
Admission: RE | Admit: 2023-11-25 | Discharge: 2023-11-25 | Disposition: A | Discharge status: Home / Self Care | Payer: 59 | Source: Ambulatory Visit | Attending: Thoracic Surgery (Cardiothoracic Vascular Surgery) | Admitting: Thoracic Surgery (Cardiothoracic Vascular Surgery)

## 2023-11-25 ENCOUNTER — Other Ambulatory Visit (HOSPITAL_COMMUNITY): Payer: Self-pay

## 2023-11-25 VITALS — BP 159/99 | HR 78 | Resp 18 | Ht 72.0 in | Wt 256.0 lb

## 2023-11-25 DIAGNOSIS — Z951 Presence of aortocoronary bypass graft: Secondary | ICD-10-CM

## 2023-11-25 MED ORDER — LISINOPRIL 20 MG PO TABS
40.0000 mg | ORAL_TABLET | Freq: Every day | ORAL | 1 refills | Status: DC
  Filled 2023-11-25 – 2023-12-17 (×2): qty 60, 30d supply, fill #0
  Filled 2023-12-19 – 2023-12-23 (×2): qty 30, 15d supply, fill #0
  Filled 2024-01-16: qty 30, 60d supply, fill #1
  Filled 2024-01-25: qty 30, 15d supply, fill #1

## 2023-11-25 NOTE — Patient Instructions (Addendum)
 You are encouraged to enroll and participate in the outpatient cardiac rehab program beginning as soon as practical.  Continue to avoid any heavy lifting or strenuous use of your arms or shoulders for at least a total of three months from the time of surgery.  After three months you may gradually increase how much you lift or otherwise use your arms or chest as tolerated, with limits based upon whether or not activities lead to the return of significant discomfort.  You may return to driving an automobile as long as you are no longer requiring oral narcotic pain relievers during the daytime.  It would be wise to start driving only short distances during the daylight and gradually increase from there as you feel comfortable.  Take Lisinopril  40mg  daily

## 2023-11-26 ENCOUNTER — Telehealth (HOSPITAL_COMMUNITY): Payer: Self-pay | Admitting: Licensed Clinical Social Worker

## 2023-11-26 NOTE — Telephone Encounter (Signed)
 CSW attempted to call pt to check in regarding SDOH concerns- unable to reach- left VM requesting return call  Burna Sis, LCSW Clinical Social Worker [facility] Desk#: [phone] Cell#: [phone]

## 2023-12-17 ENCOUNTER — Other Ambulatory Visit: Payer: Self-pay | Admitting: Physician Assistant

## 2023-12-18 ENCOUNTER — Other Ambulatory Visit (HOSPITAL_COMMUNITY): Payer: Self-pay

## 2023-12-18 NOTE — Telephone Encounter (Signed)
Pt's pharmacy is requesting a refill on insulin. Would Tereso Newcomer, PA like to refill this medication? Please address

## 2023-12-19 ENCOUNTER — Other Ambulatory Visit: Payer: Self-pay

## 2023-12-19 ENCOUNTER — Other Ambulatory Visit: Payer: Self-pay | Admitting: Physician Assistant

## 2023-12-22 ENCOUNTER — Encounter (HOSPITAL_COMMUNITY): Payer: Self-pay

## 2023-12-22 ENCOUNTER — Other Ambulatory Visit (HOSPITAL_COMMUNITY): Payer: Self-pay

## 2023-12-22 NOTE — Telephone Encounter (Signed)
Pt's pharmacy is requesting a refill on pt's insulin. Would Edwin Newcomer, PA like to refill this non cardiac medication? Please addres

## 2023-12-23 ENCOUNTER — Other Ambulatory Visit (HOSPITAL_COMMUNITY): Payer: Self-pay

## 2023-12-23 ENCOUNTER — Other Ambulatory Visit: Payer: Self-pay

## 2023-12-23 ENCOUNTER — Telehealth: Payer: Self-pay | Admitting: Physician Assistant

## 2023-12-23 ENCOUNTER — Other Ambulatory Visit: Payer: Self-pay | Admitting: Physician Assistant

## 2023-12-23 DIAGNOSIS — E118 Type 2 diabetes mellitus with unspecified complications: Secondary | ICD-10-CM

## 2023-12-23 NOTE — Telephone Encounter (Signed)
*  STAT* If patient is at the pharmacy, call can be transferred to refill team.   1. Which medications need to be refilled? (please list name of each medication and dose if known) insulin  aspart protamine  - aspart (NOVOLOG  70/30 MIX) (70-30) 100 UNIT/ML FlexPen    2. Would you like to learn more about the convenience, safety, & potential cost savings by using the [facility]? Yes       3. Are you open to using the [facility] (Type [facility]. Yes).   4. Which pharmacy/location (including street and city if local pharmacy) is medication to be sent to? [place] [facility]    5. Do they need a 30 day or 90 day supply? 90

## 2023-12-23 NOTE — Telephone Encounter (Signed)
 Ok to refill Novolog  for 90 day supply once. No refills.  He needs to establish with primary care or we can refer him to endocrinology, who will need to refill it in the future. Marlyse Single, PA-C    12/23/2023 5:57 PM

## 2023-12-24 ENCOUNTER — Other Ambulatory Visit (HOSPITAL_COMMUNITY): Payer: Self-pay

## 2023-12-24 ENCOUNTER — Other Ambulatory Visit: Payer: Self-pay

## 2023-12-24 MED ORDER — INSULIN ASPART PROT & ASPART (70-30 MIX) 100 UNIT/ML PEN
15.0000 [IU] | PEN_INJECTOR | Freq: Two times a day (BID) | SUBCUTANEOUS | 0 refills | Status: DC
  Filled 2023-12-24: qty 15, 50d supply, fill #0
  Filled 2024-01-07: qty 6, 20d supply, fill #0
  Filled 2024-01-23: qty 6, 20d supply, fill #1

## 2023-12-28 ENCOUNTER — Ambulatory Visit: Payer: 59 | Admitting: Student

## 2023-12-28 NOTE — Progress Notes (Deleted)
  SUBJECTIVE:   CHIEF COMPLAINT / HPI:   DM2 Meds: Novolog 70/30 15 units BID w/ meals Lab Results  Component Value Date   HGBA1C 10.8 (H) 10/03/2023   HLD Meds: Atorvastatin 80 mg  HTN Meds: Lisinopril 20 mg, Metoprolol 50 mg BID, Amlodipine 10 mg    PERTINENT  PMH / PSH: ***  Past Medical History:  Diagnosis Date   Allergies    Anxiety    Asthma    Depression    Essential hypertension    GERD (gastroesophageal reflux disease)    Hyperlipidemia    Type 2 diabetes mellitus (HCC)    OBJECTIVE:  There were no vitals taken for this visit. Physical Exam   ASSESSMENT/PLAN:   Assessment & Plan  No follow-ups on file. Bess Kinds, MD 12/28/2023, 6:28 AM PGY-***, [facility] {    This will disappear when note is signed, click to select method of visit    :1}

## 2024-01-05 ENCOUNTER — Other Ambulatory Visit (HOSPITAL_COMMUNITY): Payer: Self-pay

## 2024-01-07 ENCOUNTER — Other Ambulatory Visit (HOSPITAL_COMMUNITY): Payer: Self-pay

## 2024-01-18 ENCOUNTER — Other Ambulatory Visit (HOSPITAL_COMMUNITY): Payer: Self-pay

## 2024-01-23 ENCOUNTER — Other Ambulatory Visit: Payer: Self-pay

## 2024-01-24 ENCOUNTER — Other Ambulatory Visit: Payer: Self-pay

## 2024-01-25 ENCOUNTER — Other Ambulatory Visit (HOSPITAL_COMMUNITY): Payer: Self-pay

## 2024-01-28 ENCOUNTER — Other Ambulatory Visit (HOSPITAL_COMMUNITY): Payer: Self-pay

## 2024-02-06 ENCOUNTER — Other Ambulatory Visit: Payer: Self-pay | Admitting: Physician Assistant

## 2024-02-08 ENCOUNTER — Other Ambulatory Visit: Payer: Self-pay

## 2024-02-08 ENCOUNTER — Other Ambulatory Visit (HOSPITAL_COMMUNITY): Payer: Self-pay

## 2024-02-09 ENCOUNTER — Other Ambulatory Visit (HOSPITAL_COMMUNITY): Payer: Self-pay

## 2024-02-09 ENCOUNTER — Encounter (HOSPITAL_COMMUNITY): Payer: Self-pay

## 2024-02-12 ENCOUNTER — Ambulatory Visit: Payer: 59 | Admitting: Internal Medicine

## 2024-03-07 NOTE — Progress Notes (Signed)
 Cardiology Office Note    Patient Name: Edwin Ford Date of Encounter: 03/07/2024  Primary Care Provider:  Wilhemena Harbour, MD Primary Cardiologist:  Jann Melody, MD Primary Electrophysiologist: None   Past Medical History    Past Medical History:  Diagnosis Date   Allergies    Anxiety    Asthma    Depression    Essential hypertension    GERD (gastroesophageal reflux disease)    Hyperlipidemia    Type 2 diabetes mellitus (HCC)     History of Present Illness  Edwin Ford is a 43 y.o. male with a PMH of CAD s/p NSTEMI severe three-vessel disease and CABG x 4, HTN, HLD, DM type II, polysubstance abuse (cocaine, tobacco, marijuana) GERD, asthma, who presents today for 20-month follow-up.  Edwin Ford is admitted on 10/03/2023 with complaint of chest pain radiating to the arm with shortness of breath and diaphoresis.  EKG was completed showing sinus rhythm with RBBB with ST depression in inferior leads and troponin was elevated to 491.  He was transported to [facility] and underwent LHC that showed multivessel CAD.  He was consulted by TTTS and underwent CABG x 4 by Dr. Deloise Ferries on 10/07/2023 with LIMA LAD, RSVG PDA, OM, Ramus.  He had an uneventful postop course and was seen in follow-up by Marlyse Single, PA on 10/30/2023 and was concerned about med cost and had quit smoking.  Unable to afford cardiac rehab and was referred to the social work team for assistance.  He was also found to be above goal on blood pressure and was started back on amlodipine  10 mg and advised to continue lisinopril  and metoprolol .  He was seen in follow-up by thoracic surgery on 11/25/2023 and was progressing well postsurgery and was eager to go back to work and was offered light duty but declined and wanted to wait until fully cleared.  He was cleared to drive and to initiate cardiac rehab.  He reported still smoking but trying to quit and blood pressure was 155/99 and lisinopril  was increased to 40 mg  daily.  Edwin Ford presents today for follow-up reports doing well since his previous visit. In December 2024  blood pressure was above goal, leading to the re-initiation of amlodipine  and continuation of lisinopril  at 40 mg. He was cleared for light duty work and cardiac rehab in January 2025 but chose to wait for full clearance before returning to work. He returned to full-time work on December 28, 2023, which involves physical labor, including lifting up to fifty pounds. He experiences soreness but no significant issues with heavy lifting. He has not started cardiac rehab due to the physical nature of his job and monitors his physical limits to avoid overexertion. He has chronic swelling and warmth in his leg, which he attributes to the vein harvesting for his bypass surgery. He wears compression socks to manage the swelling, especially given his job requires standing for long periods. He struggles with medication affordability due to insurance issues. He lost his insurance in December 2024 after being unable to work post-surgery. He is uninsured until October 2025 and was referred to social work for assistance but could not be contacted. He has been taking lisinopril  20 mg twice daily but has run out of refills. He also takes amlodipine  and Lipitor, though he has not had recent cholesterol checks.  During today's visit his blood pressure was stable at 128/78. He quit smoking post-surgery but has since resumed smoking daily. He previously used  nicotine  patches to quit and found them effective. Patient denies chest pain, palpitations, dyspnea, PND, orthopnea, nausea, vomiting, dizziness, syncope, edema, weight gain, or early satiety.  Discussed the use of AI scribe software for clinical note transcription with the patient, who gave verbal consent to proceed.  History of Present Illness    Review of Systems  Please see the history of present illness.    All other systems reviewed and are otherwise  negative except as noted above.  Physical Exam    Wt Readings from Last 3 Encounters:  11/25/23 256 lb (116.1 kg)  10/30/23 254 lb 3.2 oz (115.3 kg)  10/12/23 237 lb 8 oz (107.7 kg)   ZO:XWRUE were no vitals filed for this visit.,There is no height or weight on file to calculate BMI. GEN: Well nourished, well developed in no acute distress Neck: No JVD; No carotid bruits Pulmonary: Clear to auscultation without rales, wheezing or rhonchi  Cardiovascular: Normal rate. Regular rhythm. Normal S1. Normal S2.   Murmurs: There is no murmur.  ABDOMEN: Soft, non-tender, non-distended EXTREMITIES:  No edema; No deformity   EKG/LABS/ Recent Cardiac Studies   ECG personally reviewed by me today -none completed today  Risk Assessment/Calculations:          Lab Results  Component Value Date   WBC 9.1 10/10/2023   HGB 11.2 (L) 10/10/2023   HCT 30.9 (L) 10/10/2023   MCV 84.7 10/10/2023   PLT 153 10/10/2023   Lab Results  Component Value Date   CREATININE 0.84 10/10/2023   BUN 7 10/10/2023   NA 132 (L) 10/10/2023   K 3.7 10/10/2023   CL 103 10/10/2023   CO2 23 10/10/2023   Lab Results  Component Value Date   CHOL 150 10/05/2023   HDL 37 (L) 10/05/2023   LDLCALC 81 10/05/2023   TRIG 161 (H) 10/05/2023   CHOLHDL 4.1 10/05/2023    Lab Results  Component Value Date   HGBA1C 10.8 (H) 10/03/2023   Assessment & Plan    1.  History of CAD: -s/p NSTEMI severe three-vessel disease and CABG x 4 in 09/2023 - Patient reports no recurrence of chest pain or angina since previous follow-up in surgery. -Chronic leg swelling likely due to venous insufficiency post-surgery. Back to full-time work with physical activity, lifting up to 50 pounds, with soreness but no significant issues. - Advise use of compression socks to manage leg swelling. - Educate on signs of cellulitis and advise to report if symptoms develop. - Encourage cardiovascular exercise such as walking to promote  circulation. - Continue current GDMT with ezetimibe  10 mg grams, ASA 81 mg, metoprolol  50 mg twice daily  2.  Hyperlipidemia: - Patient was initiated on Lipitor 80 mg following open heart surgery however has not had lipids rechecked - Check fasting LFT and lipids  3.  DM type II: - Patient has not followed up with PCP due to lack of insurance and last hemoglobin A1c was 10.8 - He was advised to contact PCP and continue current treatment plan  4.  Essential hypertension: - Patient's blood pressure today was stable and controlled at 128/78 - Continue Norvasc  10 mg daily and lisinopril  20 mg twice daily  5.  Noncompliance with medication regimen: - Patient reports compliance but has no insurance and requires assistance from social work. -He was unable to afford Cobra and requires assistance - Patient was provided the phone number for Marylin So LCSW and advised to follow-up regarding possible medication assistance  Disposition: Follow-up with Jann Melody, MD or APP in 6 months    Signed, Francene Ing, Retha Cast, NP 03/07/2024, 6:45 PM [facility]

## 2024-03-08 ENCOUNTER — Encounter: Payer: Self-pay | Admitting: Nurse Practitioner

## 2024-03-08 ENCOUNTER — Other Ambulatory Visit (HOSPITAL_COMMUNITY): Payer: Self-pay

## 2024-03-08 ENCOUNTER — Ambulatory Visit: Attending: Nurse Practitioner | Admitting: Nurse Practitioner

## 2024-03-08 VITALS — BP 128/78 | HR 78 | Ht 72.0 in | Wt 259.4 lb

## 2024-03-08 DIAGNOSIS — I10 Essential (primary) hypertension: Secondary | ICD-10-CM | POA: Diagnosis not present

## 2024-03-08 DIAGNOSIS — I2510 Atherosclerotic heart disease of native coronary artery without angina pectoris: Secondary | ICD-10-CM | POA: Diagnosis not present

## 2024-03-08 DIAGNOSIS — E118 Type 2 diabetes mellitus with unspecified complications: Secondary | ICD-10-CM

## 2024-03-08 DIAGNOSIS — E7800 Pure hypercholesterolemia, unspecified: Secondary | ICD-10-CM | POA: Diagnosis not present

## 2024-03-08 DIAGNOSIS — Z91148 Patient's other noncompliance with medication regimen for other reason: Secondary | ICD-10-CM

## 2024-03-08 MED ORDER — LISINOPRIL 20 MG PO TABS
40.0000 mg | ORAL_TABLET | Freq: Every day | ORAL | 1 refills | Status: DC
  Filled 2024-03-08: qty 180, 90d supply, fill #0
  Filled 2024-06-04: qty 180, 90d supply, fill #1

## 2024-03-08 NOTE — Patient Instructions (Signed)
 Medication Instructions:  Your physician recommends that you continue on your current medications as directed. Please refer to the Current Medication list given to you today. *If you need a refill on your cardiac medications before your next appointment, please call your pharmacy*  Lab Work: TODAY-BMET FASTING LIPIDS & LFTS If you have labs (blood work) drawn today and your tests are completely normal, you will receive your results only by: MyChart Message (if you have MyChart) OR A paper copy in the mail If you have any lab test that is abnormal or we need to change your treatment, we will call you to review the results.  Testing/Procedures: NONE ORDERED  Follow-Up: At [facility], you and your health needs are our priority.  As part of our continuing mission to provide you with exceptional heart care, our providers are all part of one team.  This team includes your primary Cardiologist (physician) and Advanced Practice Providers or APPs (Physician Assistants and Nurse Practitioners) who all work together to provide you with the care you need, when you need it.  Your next appointment:   6 month(s)  Provider:   Charles Connor, NP  We recommend signing up for the patient portal called "MyChart".  Sign up information is provided on this After Visit Summary.  MyChart is used to connect with patients for Virtual Visits (Telemedicine).  Patients are able to view lab/test results, encounter notes, upcoming appointments, etc.  Non-urgent messages can be sent to your provider as well.   To learn more about what you can do with MyChart, go to [email or web address].   Other Instructions       1st Floor: - Lobby - Registration  - Pharmacy  - Lab - Cafe  2nd Floor: - PV Lab - Diagnostic Testing (echo, CT, nuclear med)  3rd Floor: - Vacant  4th Floor: - TCTS (cardiothoracic surgery) - AFib Clinic - Structural Heart Clinic - Vascular Surgery  - Vascular Ultrasound  5th  Floor: - HeartCare Cardiology (general and EP) - Clinical Pharmacy for coumadin, hypertension, lipid, weight-loss medications, and med management appointments    Valet parking services will be available as well.

## 2024-03-09 ENCOUNTER — Telehealth (HOSPITAL_COMMUNITY): Payer: Self-pay | Admitting: Licensed Clinical Social Worker

## 2024-03-09 NOTE — Telephone Encounter (Signed)
 CSW attempted to call pt regarding lack of insurance and other SDOH concerns- unable to reach- left VM requesting return call  Denton Flakes, LCSW Clinical Social Worker [facility] Desk#: [phone] Cell#: [phone]

## 2024-03-17 ENCOUNTER — Telehealth (HOSPITAL_COMMUNITY): Payer: Self-pay | Admitting: Licensed Clinical Social Worker

## 2024-03-17 NOTE — Telephone Encounter (Signed)
 CSW attempted to call pt to discuss lack of insurance and SDOH concerns- unable to reach. Sent text message to number requesting return call  Denton Flakes, LCSW Clinical Social Worker [facility] Desk#: [phone] Cell#: [phone]

## 2024-03-21 ENCOUNTER — Other Ambulatory Visit (HOSPITAL_COMMUNITY): Payer: Self-pay

## 2024-03-21 ENCOUNTER — Telehealth (HOSPITAL_COMMUNITY): Payer: Self-pay | Admitting: Licensed Clinical Social Worker

## 2024-03-21 NOTE — Telephone Encounter (Signed)
 H&V Care Navigation CSW Progress Note  Clinical Social Worker had return call from pt to discuss lack of insurance.  Pt reports he had insurance through his employer in Nov of 2024 but then lost it due to lack of payment in Dec because he couldn't work so he couldn't pay the premium- had been informed by employer that it would start back up when he started working again but when he started working in Feb 2025 it did not resume.  Now being told he can't get it again until open enrollment so won't have active coverage until Jan 2026.  Pt making about $2,100/month so over threshold for Medicaid at this time.  Informed him of Medicaid criteria in case he meets this in the future.  CSW spoke with pt about CAFA to help with cone bills- he will plan to speak with billing about being screening for this program for any future bills- none outstanding at this time from when he was self pay.  CSW mailing application for [employer] and [place] [employer] to help with other medical visits and med cost.    Pt reports no other SDOH concerns at this time.  SDOH Screenings   Food Insecurity: Food Insecurity Present (10/03/2023)  Housing: High Risk (10/03/2023)  Transportation Needs: No Transportation Needs (10/03/2023)  Utilities: Not At Risk (10/03/2023)  Depression (PHQ2-9): Low Risk  (06/13/2022)  Financial Resource Strain: Not on File (07/11/2021)   Received from [place], [place]  Physical Activity: Not on File (07/11/2021)   Received from [place], [place]  Social Connections: Not on File (08/01/2023)   [facility]  Stress: Not on File (07/11/2021)   Received from [place], [place]  Tobacco Use: High Risk (03/08/2024)    Denton Flakes, LCSW Clinical Social Worker [facility] Desk#: [phone] Cell#: [phone]

## 2024-04-03 NOTE — Patient Instructions (Signed)
 It was great to see you! Thank you for allowing me to participate in your care!  I recommend that you always bring your medications to each appointment as this makes it easy to ensure we are on the correct medications and helps us  not miss when refills are needed.  Our plans for today:  - Diabetes  Restart insulin  70/30 at 15 units twice a day  Make follow up appointment in 1 month  Check blood sugars with glucometer kit Be on the look out for communication/call from pharmacy about continuous glucose monitor, may or may not be able to get this.    Take care and seek immediate care sooner if you develop any concerns.   Dr. Wilhemena Harbour, MD [facility] Medicine

## 2024-04-03 NOTE — Progress Notes (Signed)
  SUBJECTIVE:   CHIEF COMPLAINT / HPI:   DM2 Needs a refill of insulin . Was not taking it for over a month. Is otherwise feeling fine.    HTN Taking meds regularly, but BP slightly elevated.   PERTINENT  PMH / PSH:   OBJECTIVE:  BP (!) 145/91   Pulse 79   Ht 6' (1.829 m)   Wt 251 lb 3.2 oz (113.9 kg)   SpO2 100%   BMI 34.07 kg/m  Physical Exam Constitutional:      Appearance: Normal appearance.  Cardiovascular:     Rate and Rhythm: Normal rate and regular rhythm.     Pulses: Normal pulses.     Heart sounds: Normal heart sounds. No murmur heard.    No friction rub. No gallop.  Pulmonary:     Effort: Pulmonary effort is normal. No respiratory distress.     Breath sounds: Normal breath sounds. No stridor. No wheezing, rhonchi or rales.  Abdominal:     General: There is no distension.     Palpations: Abdomen is soft. There is no mass.     Tenderness: There is no abdominal tenderness. There is no guarding or rebound.     Hernia: No hernia is present.  Neurological:     Mental Status: He is alert.  Psychiatric:        Mood and Affect: Mood normal.        Behavior: Behavior normal.      ASSESSMENT/PLAN:   Assessment & Plan Type 2 diabetes mellitus with complication [facility]) Patient comes in for follow-up of his diabetes.  Patient reports he has not been taking his 70/30 insulin  for roughly a month.  Patient needed refill and is having problem to get appointment scheduled plus was having issues getting off work.  Patient reports he is otherwise doing well.  Patient reports he does not take CBGs at home, but needs a new kit to check.  Given patient's difficulty with checking sugars we will see if we can get him a CGM. - Messaged pharmacy about possible CGM - Continue insulin  70/30 at 15 units BID - Send glucometer kit, check CBGs first thing in the morning - Follow-up 1 month Essential hypertension, benign Patient comes in for follow-up of his blood pressure.  Patient  reports good compliance with medication.  Patient BP well-controlled today.  Will check BMP. - BMP No follow-ups on file. Wilhemena Harbour, MD 04/04/2024, 4:41 PM PGY-3, [facility]

## 2024-04-04 ENCOUNTER — Encounter: Payer: Self-pay | Admitting: Student

## 2024-04-04 ENCOUNTER — Other Ambulatory Visit (HOSPITAL_COMMUNITY): Payer: Self-pay

## 2024-04-04 ENCOUNTER — Ambulatory Visit (INDEPENDENT_AMBULATORY_CARE_PROVIDER_SITE_OTHER): Admitting: Student

## 2024-04-04 VITALS — BP 132/88 | HR 79 | Ht 72.0 in | Wt 251.2 lb

## 2024-04-04 DIAGNOSIS — I10 Essential (primary) hypertension: Secondary | ICD-10-CM | POA: Diagnosis not present

## 2024-04-04 DIAGNOSIS — E118 Type 2 diabetes mellitus with unspecified complications: Secondary | ICD-10-CM

## 2024-04-04 LAB — POCT GLYCOSYLATED HEMOGLOBIN (HGB A1C): HbA1c, POC (controlled diabetic range): 9.3 % — AB (ref 0.0–7.0)

## 2024-04-04 MED ORDER — INSULIN ASPART PROT & ASPART (70-30 MIX) 100 UNIT/ML PEN
15.0000 [IU] | PEN_INJECTOR | Freq: Two times a day (BID) | SUBCUTANEOUS | 3 refills | Status: DC
  Filled 2024-04-04: qty 6, 20d supply, fill #0
  Filled 2024-05-05: qty 6, 20d supply, fill #1

## 2024-04-04 MED ORDER — ACCU-CHEK SOFTCLIX LANCETS MISC
0 refills | Status: DC
  Filled 2024-04-04 (×2): qty 100, 25d supply, fill #0

## 2024-04-04 MED ORDER — BLOOD GLUCOSE MONITOR KIT
PACK | 0 refills | Status: DC
  Filled 2024-04-04: qty 1, 30d supply, fill #0

## 2024-04-04 MED ORDER — GLUCOSE BLOOD VI STRP
ORAL_STRIP | 0 refills | Status: DC
  Filled 2024-04-04 (×2): qty 100, 25d supply, fill #0

## 2024-04-04 NOTE — Assessment & Plan Note (Addendum)
 Patient comes in for follow-up of his blood pressure.  Patient reports good compliance with medication.  Patient BP well-controlled today.  Will check BMP. - BMP

## 2024-04-04 NOTE — Assessment & Plan Note (Addendum)
 Patient comes in for follow-up of his diabetes.  Patient reports he has not been taking his 70/30 insulin  for roughly a month.  Patient needed refill and is having problem to get appointment scheduled plus was having issues getting off work.  Patient reports he is otherwise doing well.  Patient reports he does not take CBGs at home, but needs a new kit to check.  Given patient's difficulty with checking sugars we will see if we can get him a CGM. - Messaged pharmacy about possible CGM - Continue insulin  70/30 at 15 units BID - Send glucometer kit, check CBGs first thing in the morning - Follow-up 1 month

## 2024-04-05 ENCOUNTER — Ambulatory Visit: Payer: Self-pay | Admitting: Student

## 2024-04-05 ENCOUNTER — Other Ambulatory Visit (HOSPITAL_COMMUNITY): Payer: Self-pay

## 2024-04-15 ENCOUNTER — Other Ambulatory Visit (HOSPITAL_COMMUNITY): Payer: Self-pay

## 2024-04-15 ENCOUNTER — Telehealth: Payer: Self-pay | Admitting: Pharmacist

## 2024-04-15 NOTE — Telephone Encounter (Signed)
-----   Message from Wilhemena Harbour sent at 04/05/2024  6:04 PM EDT ----- Regarding: CGM for patient? Hey Dr. Tamaiya Bump!  This is a patient of mine w/ DM2. He has problems checking his sugar (mostly just forgets and doesn't take the time to). Is it possible to get him a CGM? He is on insulin  70/30 and plans to use the credit for it to be 35 $  I told him we would try, but it could go either way!  Pleaser let me know if we can get him a CGM.  Thank you,  -BS

## 2024-04-15 NOTE — Telephone Encounter (Signed)
 Attempted to contact patient for follow-up of request to assist with CGM.  Multiple attempts - no answer.   Appears patient is Medicaid potential.  As patient is on insulin  it is likely that any insurance likely would cover CGM at low cost.    Left HIPAA compliant voice mail requesting call back to direct phone: [phone]  Total time with patient call and documentation of interaction: 7 minutes.  Follow-up phone call planned: None - will await patient to return call.

## 2024-04-26 ENCOUNTER — Encounter: Payer: Self-pay | Admitting: *Deleted

## 2024-05-05 ENCOUNTER — Encounter: Payer: Self-pay | Admitting: Student

## 2024-05-05 ENCOUNTER — Ambulatory Visit: Admitting: Student

## 2024-05-05 ENCOUNTER — Other Ambulatory Visit (HOSPITAL_COMMUNITY): Payer: Self-pay

## 2024-05-05 VITALS — BP 138/89 | HR 81 | Ht 72.0 in | Wt 240.8 lb

## 2024-05-05 DIAGNOSIS — I152 Hypertension secondary to endocrine disorders: Secondary | ICD-10-CM

## 2024-05-05 DIAGNOSIS — E118 Type 2 diabetes mellitus with unspecified complications: Secondary | ICD-10-CM

## 2024-05-05 DIAGNOSIS — F411 Generalized anxiety disorder: Secondary | ICD-10-CM

## 2024-05-05 DIAGNOSIS — D649 Anemia, unspecified: Secondary | ICD-10-CM | POA: Diagnosis not present

## 2024-05-05 DIAGNOSIS — E1159 Type 2 diabetes mellitus with other circulatory complications: Secondary | ICD-10-CM | POA: Diagnosis not present

## 2024-05-05 MED ORDER — ATORVASTATIN CALCIUM 80 MG PO TABS
80.0000 mg | ORAL_TABLET | Freq: Every day | ORAL | 3 refills | Status: AC
  Filled 2024-05-05: qty 90, 90d supply, fill #0
  Filled 2024-08-04: qty 90, 90d supply, fill #1
  Filled 2024-10-30: qty 90, 90d supply, fill #2

## 2024-05-05 NOTE — Assessment & Plan Note (Addendum)
 Patient comes in for follow-up of his blood pressure.  Blood pressure well-controlled.  Has good compliance with medication. - Continue amlodipine  10 mg daily, metoprolol  50 mg daily, lisinopril  20 mg daily - BMP

## 2024-05-05 NOTE — Assessment & Plan Note (Addendum)
 Patient comes in for follow-up of his anxiety.  Patient noting he has had worsening anxiety for the last 2 weeks following an incident at work.  Patient was triggered by an argument he heard between 2 other people.  Since then patient's anxiety has been high, he has been unable to go to work.  Reports his boss has allowed him to take as much time off as he needs.  Patient reports his mood is doing okay, and denies any suicidal ideation.  Low concern for depression at this time however patient does note high anxiety.  Have recommended patient follow-up with his psychiatrist as soon as possible, whom he usually sees twice a year.  Noted this is more emergent time and he should see a psychiatrist given the fact that his anxiety is having on his life.  Patient in agreement.  Patient also notes he recently restarted Zoloft , with taper up following previous recommendations of his psychiatrist.  Will also give therapy resources.  Will test TSH, to see if organic cause of anxiety. Patient scored 15 on GAD 7 - TSH - Follow-up with psychiatry - Continue Zoloft  300 mg - Follow-up with provider via MyChart bi-weekly - Therapy resources given

## 2024-05-05 NOTE — Patient Instructions (Addendum)
 It was great to see you! Thank you for allowing me to participate in your care!  I recommend that you always bring your medications to each appointment as this makes it easy to ensure we are on the correct medications and helps us  not miss when refills are needed.  Our plans for today:  - Diabetes  Eat 2-3 small meals a day! (Very important for management of your diabetes)  Continue Novolog  70/30 10 units twice a day, with meals  Continue Lipitor 80 mg  Follow up in 1-3 months as able  - Anxiety Your anxiety is very high right now, I am sorry that you are having to deal with this.  It would be best for you to reach out to your psychiatrist to see if they can help manage your anxiety better, as it is having a tremendous effect on your quality of life.  Will also recommend you see a therapist.  Remember every therapist may not be a good fit for you, but therapy is good fit for everyone.   Checking some blood work for causes of anxiety   Continue Zoloft    Follow up with Psychiatrist about anxiety   Find and start seeing a therapist  - High Blood Pressure  Continue medications daily  Amlodipine  10 mg, metoprolol  50, lisinopril  20    Take care and seek immediate care sooner if you develop any concerns.   Dr. Wilhemena Harbour, MD [facility] Family Medicine     Therapy and Counseling Resources Most providers on this list will take Medicaid. Patients with commercial insurance or Medicare should contact their insurance company to get a list of in network providers.  [facility] (takes children) Location 1: [address] [place], [place] [address] Location 2: [address] [place], [place] [address] [phone]   [facility] (spanish speaking therapist available)(habla espanol)(take medicare and medicaid)  [address], [place], [place] [address], [place]  [email or web address] [phone]  [facility]iatry and Counseling [address]  [place], [place] [address] [phone]  [facility]   [address] Suite [place], [place] [address]      [phone]  [facility] (spanish available) [address]  [place], [place] [address] [phone]  [facility] (take [facility] and medicare) [address]  [place], [place] [address]       [phone]     [facility] (virtual only) [phone]  [facility] [address], [place], [place] [phone]  [facility]:  [address] [place] [phone]  [facility]:  [address] [place] [phone]  [facility] (under & uninsured) [address]   [place] [phone]    [email or web address]    [facility] [address]  [place]    [phone]  [facility] -[address]     Phone:  [phone]     [facility]-  [address]  [phone]   [facility] [address]      [place], [place] [phone]  [facility]: [address] [place], [place], [place]  [phone]   [facility] (Spanish therapist) [email or web address]  [address] [place]  [address]   [place] [address]    [phone]    [facility]   [address],  [place], [place]  [phone]   [facility]  [address] [place]  [phone]  [facility]  [address] [place], [place]        [phone]  Open Access/Walk In Clinic under & uninsured  [facility]  [address] [place], [place] [phone] Crisis [phone]  [facility] [address],  (Spanish)   [address] , [place]: [phone]) 8:30 - 12; 1 - 2:30  [facility] [employer],  [address] [place], [place]     [phone]):8:30 - 12; 2 - 3PM  [facility],  [address],  [place]; [phone]):   Mon - Fri 8 AM - 5 PM  [facility] [address] [place]  MWF 12:30 to 3:00 or call to schedule an appointment  [phone]  Specific Provider options Psychology Today  [email or web address]/us  click on find a therapist  enter your zip code left side and select or tailor a therapist for your specific need.   [facility] Provider Directory [email or web address]  (Medicaid)   Follow all drop down to find a provider  Social Support program [facility] [place] [phone] or PhotoSolver.pl [address], [place], [place] Recovery support and educational   24- Hour Availability:   [facility]  [address] [place], [place] [phone] Crisis [phone]  [facility] [phone]  [place] [facility]  [phone]   [facility]  [phone] (after hours)  Therapeutic Alternative/Mobile Crisis   [phone]  [place]  National Suicide Hotline  [phone] Derrel Flies)  Call 911 or go to emergency room  [facility]  [phone]);  [place] and [employer]  [phone]); [place], [place], [place], [place], [place], [place], [place]

## 2024-05-05 NOTE — Assessment & Plan Note (Addendum)
 Patient comes in for follow-up of his diabetes.  Patient reports he has been taking his insulin  NovoLog  70/30 1-2 times a week, as opposed to twice a day.  Patient reports his anxiety is high and is causing him to not eat a lot/have an appetite.  Patient reports his fasting blood sugars range around 250 in the mornings.  Will recommend patient eat regularly and, small meals, and take smaller dose of 70/30.  Plan for follow-up in 1 to 3 months. - NovoLog  70/30, 10 units twice daily - Continue atorvastatin  80 mg - Follow-up 1 to 3 months

## 2024-05-05 NOTE — Progress Notes (Signed)
 SUBJECTIVE:   CHIEF COMPLAINT / HPI:   Diabetes Meds: Insulin  70/30 15 units BID, Lipitor 80 mg Fasting CBG: 250  He has a history of diabetes, monitored using a device that downloads sugar levels to his phone. His morning sugar levels have been around 250 mg/dL. He takes 15 units of 70/30 insulin  but has not been consistent with his diet or insulin  due to a lack of appetite and increased sleep.   HTN Meds: Amlodipine  10 mg, metoprolol  50, lisinopril  20  Anxiety He has been experiencing heightened anxiety for nearly two weeks following an argument at work, which he was not directly involved in but was present for. This incident triggered significant emotional distress, leading to tears and an inability to sleep that night. The following day, he felt too nervous to attend work and has not returned since. 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 He fears returning to work due to potential anxiety attacks and the perception of others regarding his emotional state.  He restarted Zoloft  last Friday, initially at 100 mg, increasing to 150 mg after two days, and then to 200 mg. He previously discontinued Zoloft  due to lack of insurance and affordability. He also recently refilled his Xanax  prescription, which he has been using to manage acute anxiety symptoms. The medication is helping, but he continues to experience significant anxiety, leading to isolation and reluctance to leave the house.   PERTINENT  PMH / PSH:   OBJECTIVE:  BP 138/89   Pulse 81   Ht 6' (1.829 m)   Wt 240 lb 12.8 oz (109.2 kg)   SpO2 100%   BMI 32.66 kg/m  Physical Exam Constitutional:      General: He is not in acute distress.    Appearance: Normal appearance. He is not ill-appearing.   Cardiovascular:     Rate and Rhythm: Normal rate and regular rhythm.     Heart sounds: No murmur heard.    No friction rub. No  gallop.  Pulmonary:     Effort: Pulmonary effort is normal.     Breath sounds: Normal breath sounds.  Abdominal:     General: There is no distension.     Palpations: Abdomen is soft. There is no mass.     Tenderness: There is no abdominal tenderness. There is no guarding or rebound.     Hernia: No hernia is present.   Neurological:     Mental Status: He is alert.      ASSESSMENT/PLAN:   Assessment & Plan Type 2 diabetes mellitus with complication [facility]) Patient comes in for follow-up of his diabetes.  Patient reports he has been taking his insulin  NovoLog  70/30 1-2 times a week, as opposed to twice a day.  Patient reports his anxiety is high and is causing him to not eat a lot/have an appetite.  Patient reports his fasting blood sugars range around 250 in the mornings.  Will recommend patient eat regularly and, small meals, and take smaller dose of 70/30.  Plan for follow-up in 1 to 3 months. - NovoLog  70/30, 10 units twice daily - Continue atorvastatin  80 mg - Follow-up 1 to 3 months Hypertension associated with diabetes [facility]) Patient comes in for follow-up of his blood pressure.  Blood pressure well-controlled.  Has good compliance with medication. - Continue amlodipine  10 mg daily, metoprolol  50 mg daily, lisinopril  20 mg daily - BMP GAD (generalized  anxiety disorder) Patient comes in for follow-up of his anxiety.  Patient noting he has had worsening anxiety for the last 2 weeks following an incident at work.  Patient was triggered by an argument he heard between 2 other people.  Since then patient's anxiety has been high, he has been unable to go to work.  Reports his boss has allowed him to take as much time off as he needs.  Patient reports his mood is doing okay, and denies any suicidal ideation.  Low concern for depression at this time however patient does note high anxiety.  Have recommended patient follow-up with his psychiatrist as soon as possible, whom he usually sees twice a  year.  Noted this is more emergent time and he should see a psychiatrist given the fact that his anxiety is having on his life.  Patient in agreement.  Patient also notes he recently restarted Zoloft , with taper up following previous recommendations of his psychiatrist.  Will also give therapy resources.  Will test TSH, to see if organic cause of anxiety. Patient scored 15 on GAD 7 - TSH - Follow-up with psychiatry - Continue Zoloft  300 mg - Follow-up with provider via MyChart bi-weekly - Therapy resources given Anemia, unspecified type Anemia noted in labwork, checking status -CBC No follow-ups on file. Wilhemena Harbour, MD 05/05/2024, 1:40 PM PGY-3, [facility]

## 2024-05-06 ENCOUNTER — Ambulatory Visit: Payer: Self-pay | Admitting: Student

## 2024-05-06 LAB — CBC WITH DIFFERENTIAL/PLATELET
Basophils Absolute: 0.1 10*3/uL (ref 0.0–0.2)
Basos: 0 %
EOS (ABSOLUTE): 0.1 10*3/uL (ref 0.0–0.4)
Eos: 1 %
Hematocrit: 39.2 % (ref 37.5–51.0)
Hemoglobin: 13.5 g/dL (ref 13.0–17.7)
Immature Grans (Abs): 0 10*3/uL (ref 0.0–0.1)
Immature Granulocytes: 0 %
Lymphocytes Absolute: 1.7 10*3/uL (ref 0.7–3.1)
Lymphs: 15 %
MCH: 31 pg (ref 26.6–33.0)
MCHC: 34.4 g/dL (ref 31.5–35.7)
MCV: 90 fL (ref 79–97)
Monocytes Absolute: 0.7 10*3/uL (ref 0.1–0.9)
Monocytes: 6 %
Neutrophils Absolute: 8.6 10*3/uL — ABNORMAL HIGH (ref 1.4–7.0)
Neutrophils: 78 %
Platelets: 161 10*3/uL (ref 150–450)
RBC: 4.36 x10E6/uL (ref 4.14–5.80)
RDW: 12.8 % (ref 11.6–15.4)
WBC: 11.2 10*3/uL — ABNORMAL HIGH (ref 3.4–10.8)

## 2024-05-06 LAB — BASIC METABOLIC PANEL WITH GFR
BUN/Creatinine Ratio: 18 (ref 9–20)
BUN: 18 mg/dL (ref 6–24)
CO2: 20 mmol/L (ref 20–29)
Calcium: 9.1 mg/dL (ref 8.7–10.2)
Chloride: 103 mmol/L (ref 96–106)
Creatinine, Ser: 1.02 mg/dL (ref 0.76–1.27)
Glucose: 227 mg/dL — ABNORMAL HIGH (ref 70–99)
Potassium: 4.2 mmol/L (ref 3.5–5.2)
Sodium: 138 mmol/L (ref 134–144)
eGFR: 92 mL/min/{1.73_m2} (ref >59)

## 2024-05-06 LAB — TSH RFX ON ABNORMAL TO FREE T4: TSH: 0.865 u[IU]/mL (ref 0.450–4.500)

## 2024-05-12 NOTE — Addendum Note (Signed)
 Addended by: JENNELLE RIIS T on: 05/12/2024 10:56 AM   Modules accepted: Orders

## 2024-05-24 NOTE — Progress Notes (Signed)
 Clinic Attending  Case discussed with the resident at the time of the visit.  We reviewed the resident's history and exam and pertinent patient test results.  I agree with the assessment, diagnosis, and plan of care documented in the resident's note.

## 2024-07-27 ENCOUNTER — Other Ambulatory Visit: Payer: Self-pay | Admitting: Nurse Practitioner

## 2024-07-28 ENCOUNTER — Other Ambulatory Visit: Payer: Self-pay

## 2024-07-28 ENCOUNTER — Other Ambulatory Visit (HOSPITAL_COMMUNITY): Payer: Self-pay

## 2024-07-28 MED ORDER — LISINOPRIL 20 MG PO TABS
40.0000 mg | ORAL_TABLET | Freq: Every day | ORAL | 2 refills | Status: AC
  Filled 2024-07-28 – 2024-09-06 (×2): qty 180, 90d supply, fill #0
  Filled 2024-10-30 – 2024-12-01 (×3): qty 180, 90d supply, fill #1

## 2024-09-06 ENCOUNTER — Other Ambulatory Visit (HOSPITAL_COMMUNITY): Payer: Self-pay

## 2024-09-06 ENCOUNTER — Other Ambulatory Visit: Payer: Self-pay

## 2024-10-30 ENCOUNTER — Other Ambulatory Visit: Payer: Self-pay | Admitting: Physician Assistant

## 2024-10-31 ENCOUNTER — Other Ambulatory Visit (HOSPITAL_COMMUNITY): Payer: Self-pay

## 2024-10-31 ENCOUNTER — Other Ambulatory Visit: Payer: Self-pay

## 2024-10-31 ENCOUNTER — Other Ambulatory Visit: Payer: Self-pay | Admitting: Physician Assistant

## 2024-11-01 ENCOUNTER — Other Ambulatory Visit: Payer: Self-pay | Admitting: Physician Assistant

## 2024-11-01 ENCOUNTER — Other Ambulatory Visit: Payer: Self-pay

## 2024-11-01 ENCOUNTER — Other Ambulatory Visit (HOSPITAL_COMMUNITY): Payer: Self-pay

## 2024-11-01 DIAGNOSIS — I2510 Atherosclerotic heart disease of native coronary artery without angina pectoris: Secondary | ICD-10-CM

## 2024-11-01 DIAGNOSIS — I10 Essential (primary) hypertension: Secondary | ICD-10-CM

## 2024-11-01 MED ORDER — EZETIMIBE 10 MG PO TABS
10.0000 mg | ORAL_TABLET | Freq: Every day | ORAL | 3 refills | Status: AC
  Filled 2024-11-01: qty 90, 90d supply, fill #0

## 2024-11-01 MED ORDER — AMLODIPINE BESYLATE 10 MG PO TABS
10.0000 mg | ORAL_TABLET | Freq: Every day | ORAL | 3 refills | Status: AC
  Filled 2024-11-01: qty 90, 90d supply, fill #0

## 2024-11-01 MED ORDER — METOPROLOL TARTRATE 50 MG PO TABS
50.0000 mg | ORAL_TABLET | Freq: Two times a day (BID) | ORAL | 3 refills | Status: AC
  Filled 2024-11-01: qty 180, 90d supply, fill #0

## 2024-11-01 MED ORDER — CLOPIDOGREL BISULFATE 75 MG PO TABS
75.0000 mg | ORAL_TABLET | Freq: Every day | ORAL | 3 refills | Status: AC
  Filled 2024-11-01: qty 90, 90d supply, fill #0

## 2024-11-29 ENCOUNTER — Other Ambulatory Visit (HOSPITAL_COMMUNITY): Payer: Self-pay

## 2024-11-29 NOTE — Progress Notes (Unsigned)
" ° °  SUBJECTIVE:   CHIEF COMPLAINT / HPI:  Edwin Ford is a 44 y.o. male with a pertinent past medical history of T2DM, HTN, HLD, GAD, and MDD presenting to the clinic for ***follow up on T2DM as well as anxiety, and new *** hand pain.  *** hand pain   Anxiety   Type 2 diabetes mellitus - Prior A1c 9.3 in May 2025 - Home CBGs: *** - Medications: Novolog  15 units BID*** - Hypoglycemia plan: *** - Adherence: *** - Eye exam: DUE - Foot exam: DUE - Microalbumin: DUE - Statin: Atorvastatin  80 mg - *** symptoms of hypoglycemia, polyuria, polydipsia, numbness of extremities, foot ulcers/trauma   PERTINENT PMH / PSH: T2DM HTN, HLD MDD, GAD  *Remainder reviewed in problem list.   OBJECTIVE:   There were no vitals taken for this visit.  General: Age-appropriate, resting comfortably in chair, NAD, alert and at baseline. HEENT:  Head: Normocephalic, atraumatic. No tenderness to percussion over sinuses. Eyes: PERRLA. No conjunctival erythema or scleral injections. Ears: TMs non-bulging and non-erythematous bilaterally. No erythema of external ear canal. No cerumen impaction. Nose: Non-erythematous turbinates. No rhinorrhea. Mouth/Oral: Clear, no tonsillar exudate. MMM. Neck: Supple. No LAD. Cardiovascular: Regular rate and rhythm. Normal S1/S2. No murmurs, rubs, or gallops appreciated. 2+ radial pulses. Pulmonary: Clear bilaterally to ascultation. No wheezes, crackles, or rhonchi. Normal WOB on room air. No accessory muscle use. Abdominal: No tenderness to deep or light palpation. No rebound or guarding. No HSM. Skin: Warm and dry. Extremities: No peripheral edema bilaterally. Capillary refill <2 seconds.  No results found for this or any previous visit (from the past 48 hours).     05/05/2024    1:41 PM  Depression screen PHQ 2/9  Decreased Interest 1  Down, Depressed, Hopeless 2  PHQ - 2 Score 3  Altered sleeping 2  Tired, decreased energy 2  Change in appetite 2   Feeling bad or failure about yourself  0  Trouble concentrating 2  Moving slowly or fidgety/restless 2  Suicidal thoughts 0  PHQ-9 Score 13      Data saved with a previous flowsheet row definition     ASSESSMENT/PLAN:   Assessment & Plan Type 2 diabetes mellitus with complication (HCC) A1c *** today, *** controlled. - ***Continue insulin  Novolog  15 units BID - Start metformin  XR 500 mg daily  No follow-ups on file.  Avi Kerschner Toma, MD [facility] "

## 2024-11-29 NOTE — Assessment & Plan Note (Signed)
 A1c *** today, *** controlled. - ***Continue insulin  Novolog  15 units BID - Start metformin  XR 500 mg daily

## 2024-11-30 ENCOUNTER — Encounter: Payer: Self-pay | Admitting: Family Medicine

## 2024-11-30 ENCOUNTER — Ambulatory Visit: Admitting: Family Medicine

## 2024-11-30 ENCOUNTER — Other Ambulatory Visit (HOSPITAL_COMMUNITY): Payer: Self-pay

## 2024-11-30 VITALS — BP 143/85 | HR 86 | Ht 72.0 in | Wt 251.6 lb

## 2024-11-30 DIAGNOSIS — E1140 Type 2 diabetes mellitus with diabetic neuropathy, unspecified: Secondary | ICD-10-CM | POA: Diagnosis not present

## 2024-11-30 DIAGNOSIS — Z794 Long term (current) use of insulin: Secondary | ICD-10-CM

## 2024-11-30 DIAGNOSIS — M79641 Pain in right hand: Secondary | ICD-10-CM | POA: Diagnosis not present

## 2024-11-30 DIAGNOSIS — Z5971 Insufficient health insurance coverage: Secondary | ICD-10-CM | POA: Diagnosis not present

## 2024-11-30 DIAGNOSIS — E118 Type 2 diabetes mellitus with unspecified complications: Secondary | ICD-10-CM

## 2024-11-30 LAB — POCT GLYCOSYLATED HEMOGLOBIN (HGB A1C): HbA1c, POC (controlled diabetic range): 13.6 % — AB (ref 0.0–7.0)

## 2024-11-30 MED ORDER — GABAPENTIN 100 MG PO CAPS
200.0000 mg | ORAL_CAPSULE | Freq: Three times a day (TID) | ORAL | 3 refills | Status: AC
  Filled 2024-11-30: qty 180, 30d supply, fill #0

## 2024-11-30 MED ORDER — METFORMIN HCL ER 500 MG PO TB24
500.0000 mg | ORAL_TABLET | Freq: Two times a day (BID) | ORAL | 3 refills | Status: AC
  Filled 2024-11-30: qty 180, 90d supply, fill #0

## 2024-11-30 MED ORDER — BASAGLAR KWIKPEN 100 UNIT/ML ~~LOC~~ SOPN
10.0000 [IU] | PEN_INJECTOR | SUBCUTANEOUS | 11 refills | Status: DC
  Filled 2024-11-30: qty 3, 30d supply, fill #0

## 2024-11-30 MED ORDER — INSULIN ASPART PROT & ASPART (70-30 MIX) 100 UNIT/ML PEN: 10.0000 [IU] | PEN_INJECTOR | Freq: Two times a day (BID) | SUBCUTANEOUS | Status: DC

## 2024-11-30 NOTE — Patient Instructions (Addendum)
 It was great to see you today! Thank you for choosing [facility] for your primary care.  Today we addressed: Diabetes Your A1c has increased to 13.6.  We will work together to decrease this and get your diabetes under better control. I would like you to try starting insulin  glargine (Basaglar ), 10 units once daily.  You should stop your other insulin  for now and try this Basaglar  instead.  We are starting a low dose and you will likely need to increase this in the future. I am also starting metformin  500 mg twice daily today, please let us  know if you are having stomach distress and then stop this medicine. would like you to see our pharmacist Dr. Koval on Monday to discuss her insulin  regimen better.  Hand tingling I suspect this has to do with your diabetes, though issues like carpal tunnel are also possible.  I will start by prescribing gabapentin  today, but if this does not help or if you are unable to afford it we will try some other treatments. You can try a wrist brace for carpal tunnel to see if that helps at all.  Insurance coverage problems Please look out for a phone call from our social workers, they will leave a message if they do not reach you.  I would love for you to discuss your insurance coverage with them.  You should return to our clinic on Monday to see our pharmacist Dr. Koval, and in 1 month to see myself.  Thank you for coming to see us  at [facility] and for the opportunity to care for you! Trevone Prestwood, MD 11/30/2024, 10:39 AM

## 2024-12-01 ENCOUNTER — Other Ambulatory Visit (HOSPITAL_COMMUNITY): Payer: Self-pay

## 2024-12-01 LAB — BASIC METABOLIC PANEL WITH GFR
BUN/Creatinine Ratio: 15 (ref 9–20)
BUN: 17 mg/dL (ref 6–24)
CO2: 18 mmol/L — ABNORMAL LOW (ref 20–29)
Calcium: 9.5 mg/dL (ref 8.7–10.2)
Chloride: 99 mmol/L (ref 96–106)
Creatinine, Ser: 1.14 mg/dL (ref 0.76–1.27)
Glucose: 411 mg/dL — ABNORMAL HIGH (ref 70–99)
Potassium: 5 mmol/L (ref 3.5–5.2)
Sodium: 132 mmol/L — ABNORMAL LOW (ref 134–144)
eGFR: 81 mL/min/1.73

## 2024-12-01 LAB — MICROALBUMIN / CREATININE URINE RATIO
Creatinine, Urine: 107.7 mg/dL
Microalb/Creat Ratio: 283 mg/g{creat} — ABNORMAL HIGH (ref 0–29)
Microalbumin, Urine: 304.4 ug/mL

## 2024-12-02 ENCOUNTER — Ambulatory Visit: Payer: Self-pay | Admitting: Family Medicine

## 2024-12-02 NOTE — Progress Notes (Signed)
 Moderate increase in Microalbumin/Cr ratio, suggesting early kidney pathology.  Patient already taking ACEi for renal protection and not able to afford SGLT2i without insurance.  Goal will be to attain better T2DM control for risk reduction.  Noted glucose of 411 with borderline low bicarbonate on BMP, which is in the setting of the patient's sharp elevation in A1c to 13.6 from 9.3 just 8 months ago.  Patient without polyphagia, polyuria, abdominal pain, or vomiting in clinic and no current concern for HHS or DKA.  Patient is returning to clinic on Monday to see Dr. Koval and will likely require titration of insulin  to higher dose, may receive CGM from Dr. Koval if appropriate.

## 2024-12-05 ENCOUNTER — Encounter: Payer: Self-pay | Admitting: Pharmacist

## 2024-12-05 ENCOUNTER — Ambulatory Visit: Admitting: Pharmacist

## 2024-12-05 VITALS — BP 112/65 | HR 61 | Wt 258.8 lb

## 2024-12-05 DIAGNOSIS — E1140 Type 2 diabetes mellitus with diabetic neuropathy, unspecified: Secondary | ICD-10-CM | POA: Diagnosis not present

## 2024-12-05 DIAGNOSIS — E7800 Pure hypercholesterolemia, unspecified: Secondary | ICD-10-CM | POA: Diagnosis not present

## 2024-12-05 DIAGNOSIS — E118 Type 2 diabetes mellitus with unspecified complications: Secondary | ICD-10-CM | POA: Diagnosis not present

## 2024-12-05 DIAGNOSIS — Z794 Long term (current) use of insulin: Secondary | ICD-10-CM | POA: Diagnosis not present

## 2024-12-05 DIAGNOSIS — I10 Essential (primary) hypertension: Secondary | ICD-10-CM | POA: Diagnosis not present

## 2024-12-05 MED ORDER — BASAGLAR KWIKPEN 100 UNIT/ML ~~LOC~~ SOPN: 30.0000 [IU] | PEN_INJECTOR | SUBCUTANEOUS | 0 refills | Status: AC

## 2024-12-05 NOTE — Assessment & Plan Note (Signed)
-   Secondary prevention in patient with diabetes. Last LDL 81 mg/dl is not at goal of <44 mg/dL. High intensity indicated.  - Continued atorvastatin  80 mg daily - Continued ezetimibe  10 mg daily

## 2024-12-05 NOTE — Patient Instructions (Signed)
 It was nice to see you today!  Your goal glucose value is 80-130 before eating and less than 180 after eating.  Medication Changes: Take another dose of Lantus  (insulin  glargine) 10 units today, then starting tomorrow increase Lantus  (insulin  glargine) by 2 units each day until you reach 30 units daily.   Continue all other medication the same.   Keep up the good work with diet and exercise. Aim for a diet full of vegetables, fruit and lean meats (chicken, turkey, fish). Try to limit salt intake by eating fresh or frozen vegetables (instead of canned), rinse canned vegetables prior to cooking and do not add any additional salt to meals.   Monitor your glucose at home and keep a log (glucometer or piece of paper) to bring with you to your next visit.  Please bring all medications to your clinic visits.  Please arrive 10-15 minutes prior to your scheduled visit time.

## 2024-12-05 NOTE — Assessment & Plan Note (Signed)
 Diabetes longstanding currently uncontrolled. Patient is able to verbalize appropriate hypoglycemia management plan. Medication adherence appears good with recent restart of insulin .  - Increased dose of basal insulin  Lantus  (insulin  glargine) from 10 units daily to 20 units daily in the morning starting today and then increase by 2 units daily to achieve 30 units in the AM by next visit in 1 week.  - Continued metformin  XR 500 mg BID.  - Patient educated on purpose, proper use, and potential adverse effects.  - Extensively discussed pathophysiology of diabetes, recommended lifestyle interventions, dietary effects on glucose control.  - Counseled on s/sx of and management of hypoglycemia.

## 2024-12-05 NOTE — Progress Notes (Signed)
 "   S:    Chief Complaint  Edwin Ford presents with   Medication Management    Diabetes follow-up    44 y.o. male who presents for diabetes evaluation, education, and management. Edwin Ford arrives in good spirits and presents without any assistance.   Edwin Ford was referred and last seen by Primary Care Provider, Dr. Toma, on 11/30/2024. At last visit, Edwin Ford was referred for further diabetes medication management and insulin  therapy was RE-initiated.  PMH is significant for NSTEMI in 2024, T2DM, hypertension, hyperlipidemia, GAD, MDD.  Edwin Ford reports Diabetes was diagnosed in 2016 and has taken up to 30 units of insulin  daily in the past.   Current diabetes medications include: metformin  XR 500 mg BID, Lantus  (insulin  glargine) 10 units daily Current hypertension medications include: amlodipine  10 mg daily, lisinopril  20 mg daily Current hyperlipidemia medications include: atorvastatin  80 mg daily, ezetimibe  10 mg daily   Edwin Ford reports adherence to taking all medications as prescribed.   Insurance coverage: no insurance currently. Reports he can't get Medicaid because he is working. Counseled to go to [facility] office and determine eligibility (direction and info flyer provided).   Edwin Ford denies hypoglycemic events. Reports sugar this morning 296 mg/dL before eating breakfast.   Edwin Ford reports nocturia (nighttime urination). Reports waking up 8-10 times nightly.  Edwin Ford reports neuropathy (nerve pain). Reports hands are numb, sometimes feels pins and needles, burning, and tightness in fingers.   Edwin Ford reported dietary habits: trying to minimize carbs Drinks: water, sugar-free Pepsi Zero   O:   Review of Systems  Neurological:  Positive for tingling (fingers bilaterally) and sensory change (fingers bilaterally).  All other systems reviewed and are negative.  Physical Exam Vitals reviewed.  Constitutional:      Appearance: Normal appearance.  Pulmonary:     Effort:  Pulmonary effort is normal.  Neurological:     Mental Status: He is alert.  Psychiatric:        Mood and Affect: Mood normal.        Behavior: Behavior normal.        Thought Content: Thought content normal.        Judgment: Judgment normal.    Lab Results  Component Value Date   HGBA1C 13.6 (A) 11/30/2024   Vitals:   12/05/24 1001  BP: 112/65  Pulse: 61  SpO2: 99%   Lipid Panel     Component Value Date/Time   CHOL 150 10/05/2023 0725   CHOL 169 04/18/2022 1106   TRIG 161 (H) 10/05/2023 0725   HDL 37 (L) 10/05/2023 0725   HDL 32 (L) 04/18/2022 1106   CHOLHDL 4.1 10/05/2023 0725   VLDL 32 10/05/2023 0725   LDLCALC 81 10/05/2023 0725   LDLCALC 93 04/18/2022 1106   Clinical Atherosclerotic Cardiovascular Disease (ASCVD): Yes  The ASCVD Risk score (Arnett DK, et al., 2019) failed to calculate for the following reasons:   Risk score cannot be calculated because Edwin Ford has a medical history suggesting prior/existing ASCVD   * - Cholesterol units were assumed  Lab Results  Component Value Date   CHOL 150 10/05/2023   HDL 37 (L) 10/05/2023   LDLCALC 81 10/05/2023   TRIG 161 (H) 10/05/2023   CHOLHDL 4.1 10/05/2023   Lab Results  Component Value Date   CREATININE 1.14 11/30/2024   BUN 17 11/30/2024   NA 132 (L) 11/30/2024   K 5.0 11/30/2024   CL 99 11/30/2024   CO2 18 (L) 11/30/2024   Medications Reviewed Today  Reviewed by Ellina Sivertsen G, RPH-CPP (Pharmacist) on 12/05/24 at 1001  Med List Status: <None>   Medication Order Taking? Sig Documenting Provider Last Dose Status Informant  albuterol  (PROVENTIL  HFA) 108 (90 Base) MCG/ACT inhaler [id number] Yes INHALE 2 PUFFS BY MOUTH EVERY 6 HOURS AS NEEDED FOR COUGHING, WHEEZING, OR SHORTNESS OF BREATH Comer Kirsch, PA-C  Active Self  alprazolam  (XANAX ) 2 MG tablet [id number] Yes Take 2 mg by mouth 3 (three) times daily as needed for sleep. [provider]  Active Self  amLODipine  (NORVASC ) 10 MG tablet  [id number] Yes Take 1 tablet (10 mg total) by mouth daily. Lelon Glendia ONEIDA DEVONNA  Active   aspirin  EC 81 MG tablet [id number] Yes Take 1 tablet (81 mg total) by mouth daily. Swallow whole. Lelon Glendia T, PA-C  Active   atorvastatin  (LIPITOR) 80 MG tablet [id number]  Take 1 tablet (80 mg total) by mouth daily.  Edwin Ford not taking: Reported on 12/05/2024   Jennelle Riis, MD  Active   clopidogrel  (PLAVIX ) 75 MG tablet [id number] Yes Take 1 tablet (75 mg total) by mouth daily. Lelon Glendia T, PA-C  Active   ezetimibe  (ZETIA ) 10 MG tablet [id number] Yes Take 1 tablet (10 mg total) by mouth daily. Lelon Glendia T, PA-C  Active   gabapentin  (NEURONTIN ) 100 MG capsule [id number] Yes Take 2 capsules (200 mg total) by mouth 3 (three) times daily.  Edwin Ford taking differently: Take 200 mg by mouth at bedtime.   Shitarev, Dimitry, MD  Active   Insulin  Glargine (BASAGLAR  KWIKPEN) 100 UNIT/ML [id number] Yes Inject 10 Units into the skin every morning. Shitarev, Dimitry, MD  Active   lisinopril  (ZESTRIL ) 20 MG tablet [id number] Yes Take 2 tablets (40 mg total) by mouth daily. Santo Stanly LABOR, MD  Active   metFORMIN  (GLUCOPHAGE -XR) 500 MG 24 hr tablet [id number] Yes Take 1 tablet (500 mg total) by mouth 2 (two) times daily with a meal. Shitarev, Dimitry, MD  Active   metoprolol  tartrate (LOPRESSOR ) 50 MG tablet [id number]  Take 1 tablet (50 mg total) by mouth 2 (two) times daily.  Edwin Ford not taking: Reported on 12/05/2024   Lelon Glendia T, PA-C  Active    Edwin Ford not taking:   Discontinued 12/05/24 1000 (Completed Course)   sertraline  (ZOLOFT ) 100 MG tablet [id number] Yes Take 300 mg by mouth daily. [provider]  Active            A/P: Diabetes longstanding currently uncontrolled. Edwin Ford is able to verbalize appropriate hypoglycemia management plan. Medication adherence appears good with recent restart of insulin .  - Increased dose of basal insulin  Lantus  (insulin  glargine) from 10 units  daily to 20 units daily in the morning starting today and then increase by 2 units daily to achieve 30 units in the AM by next visit in 1 week.   - Continued metformin  XR 500 mg BID.  - Edwin Ford educated on purpose, proper use, and potential adverse effects.  - Extensively discussed pathophysiology of diabetes, recommended lifestyle interventions, dietary effects on glucose control.  - Counseled on s/sx of and management of hypoglycemia.  - Insulin  sample supply of 5 pens provided. Instructions to determine eligibility provided.   ASCVD risk - Secondary prevention in Edwin Ford with diabetes. Last LDL 81 mg/dl is not at goal of <44 mg/dL. High intensity indicated.  - Continued atorvastatin  80 mg daily - Continued ezetimibe  10 mg daily   Hypertension longstanding currently controlled. Blood pressure goal of <130/80 mmHg. Medication adherence good.  - Continued  amlodipine  10 mg daily - Continued lisinopril  20 mg daily - Continued metoprolol  tartrate 50 mg BID (unsure of adherence with this medication)  Written Edwin Ford instructions provided. Edwin Ford verbalized understanding of treatment plan.  Total time in face to face counseling 38 minutes.    Follow-up:  Pharmacist visit 12/12/2024 PCP clinic visit 01/03/2025 Edwin Ford seen with Duwaine Dry, PharmD Candidate - PY4 student.    "

## 2024-12-05 NOTE — Assessment & Plan Note (Signed)
 Hypertension longstanding currently controlled. Blood pressure goal of <130/80 mmHg. Medication adherence good.  - Continued amlodipine  10 mg daily - Continued lisinopril  20 mg daily - Continued metoprolol  tartrate 50 mg BID (unsure of adherence with this medication)

## 2024-12-07 NOTE — Progress Notes (Signed)
 Reviewed and agree with Dr Rennis plan.

## 2024-12-08 ENCOUNTER — Telehealth: Payer: Self-pay | Admitting: *Deleted

## 2024-12-08 NOTE — Progress Notes (Signed)
 Complex Care Management Note Care Guide Note  12/08/2024 Name: Edwin Ford MRN: [id number] DOB: 15-Sep-1981   Complex Care Management Outreach Attempts: An unsuccessful telephone outreach was attempted today to offer the patient information about available complex care management services.  Follow Up Plan:  Additional outreach attempts will be made to offer the patient complex care management information and services.   Encounter Outcome:  No Answer  Harlene Satterfield  [facility]  [facility] Guide  Direct Dial: [phone]  Fax [phone]

## 2024-12-08 NOTE — Progress Notes (Signed)
 Complex Care Management Note  Care Guide Note 12/08/2024 Name: Edwin Ford MRN: [id number] DOB: 06-19-1981  [name] is a 44 y.o. year old male who sees Shitarev, Dimitry, MD for primary care. I reached out to [name] by phone today to offer complex care management services.  [name] was given information about Complex Care Management services today including:   The Complex Care Management services include support from the care team which includes your Nurse Care Manager, Clinical Social Worker, or Pharmacist.  The Complex Care Management team is here to help remove barriers to the health concerns and goals most important to you. Complex Care Management services are voluntary, and the patient may decline or stop services at any time by request to their care team member.   Complex Care Management Consent Status: Patient agreed to services and verbal consent obtained.   Follow up plan:  Telephone appointment with complex care management team member scheduled for:  12/19/24 with BSW and 2/6 with RNCM   Encounter Outcome:  Patient Scheduled  Harlene Satterfield  [facility]  [facility] Guide  Direct Dial: [phone]  Fax [phone]

## 2024-12-12 ENCOUNTER — Ambulatory Visit: Admitting: Pharmacist

## 2024-12-19 ENCOUNTER — Other Ambulatory Visit: Payer: Self-pay

## 2024-12-22 ENCOUNTER — Telehealth: Payer: Self-pay | Admitting: *Deleted

## 2024-12-22 NOTE — Progress Notes (Unsigned)
 Complex Care Management Care Guide Note  12/22/2024 Name: Edwin Ford MRN: [id number] DOB: 07/24/1981  [name] is a 44 y.o. year old male who is a primary care patient of Shitarev, Dimitry, MD and is actively engaged with the care management team. I reached out to [name] by phone today to assist with re-scheduling  with the BSW.  Follow up plan: Unsuccessful telephone outreach attempt made.   Harlene Satterfield  [facility]  [facility], [facility] Guide  Direct Dial: [phone]  Fax [phone]

## 2024-12-23 ENCOUNTER — Telehealth: Payer: Self-pay | Admitting: *Deleted

## 2024-12-23 NOTE — Progress Notes (Unsigned)
 Complex Care Management Care Guide Note  12/23/2024 Name: [name] MRN: [id number] DOB: 1981/05/23  Edwin Ford is a 44 y.o. year old male who is a primary care patient of Shitarev, Dimitry, MD and is actively engaged with the care management team. I reached out to Edwin Ford by phone today to assist with re-scheduling  with the RN Case Manager BSW.  Follow up plan: Unsuccessful telephone outreach attempt made.   Harlene Satterfield  [facility]  [facility], [facility] Guide  Direct Dial: [phone]  Fax [phone]

## 2025-01-03 ENCOUNTER — Ambulatory Visit: Payer: Self-pay | Admitting: Family Medicine

## 2025-01-11 ENCOUNTER — Ambulatory Visit: Admitting: Physician Assistant
